# Patient Record
Sex: Female | Born: 1980
Health system: Southern US, Community
[De-identification: ages and names within clinical notes are randomized; demographics above are authoritative.]

## PROBLEM LIST (undated history)

## (undated) DIAGNOSIS — I10 Essential (primary) hypertension: Secondary | ICD-10-CM

## (undated) DIAGNOSIS — G43909 Migraine, unspecified, not intractable, without status migrainosus: Secondary | ICD-10-CM

## (undated) DIAGNOSIS — F32A Depression, unspecified: Secondary | ICD-10-CM

## (undated) DIAGNOSIS — G4700 Insomnia, unspecified: Secondary | ICD-10-CM

## (undated) DIAGNOSIS — G2581 Restless legs syndrome: Secondary | ICD-10-CM

## (undated) DIAGNOSIS — K219 Gastro-esophageal reflux disease without esophagitis: Secondary | ICD-10-CM

## (undated) DIAGNOSIS — K625 Hemorrhage of anus and rectum: Secondary | ICD-10-CM

## (undated) DIAGNOSIS — R7989 Other specified abnormal findings of blood chemistry: Secondary | ICD-10-CM

## (undated) DIAGNOSIS — E7800 Pure hypercholesterolemia, unspecified: Secondary | ICD-10-CM

## (undated) DIAGNOSIS — F411 Generalized anxiety disorder: Secondary | ICD-10-CM

## (undated) DIAGNOSIS — G252 Other specified forms of tremor: Secondary | ICD-10-CM

## (undated) DIAGNOSIS — O24419 Gestational diabetes mellitus in pregnancy, unspecified control: Secondary | ICD-10-CM

## (undated) DIAGNOSIS — K602 Anal fissure, unspecified: Secondary | ICD-10-CM

## (undated) DIAGNOSIS — G250 Essential tremor: Secondary | ICD-10-CM

## (undated) DIAGNOSIS — Z8639 Personal history of other endocrine, nutritional and metabolic disease: Secondary | ICD-10-CM

## (undated) DIAGNOSIS — Z8619 Personal history of other infectious and parasitic diseases: Secondary | ICD-10-CM

## (undated) DIAGNOSIS — F1011 Alcohol abuse, in remission: Secondary | ICD-10-CM

## (undated) DIAGNOSIS — F329 Major depressive disorder, single episode, unspecified: Secondary | ICD-10-CM

## (undated) DIAGNOSIS — R51 Headache: Secondary | ICD-10-CM

## (undated) DIAGNOSIS — D649 Anemia, unspecified: Secondary | ICD-10-CM

## (undated) DIAGNOSIS — Z87442 Personal history of urinary calculi: Secondary | ICD-10-CM

## (undated) HISTORY — DX: Other specified forms of tremor: G25.2

## (undated) HISTORY — DX: Migraine, unspecified, not intractable, without status migrainosus: G43.909

## (undated) HISTORY — DX: Essential (primary) hypertension: I10

## (undated) HISTORY — DX: Headache: R51

## (undated) HISTORY — DX: Insomnia, unspecified: G47.00

## (undated) HISTORY — DX: Gestational diabetes mellitus in pregnancy, unspecified control: O24.419

## (undated) HISTORY — DX: Hemorrhage of anus and rectum: K62.5

## (undated) HISTORY — DX: Restless legs syndrome: G25.81

## (undated) HISTORY — DX: Major depressive disorder, single episode, unspecified: F32.9

## (undated) HISTORY — PX: EXCISIONAL HEMORRHOIDECTOMY: SHX1541

## (undated) HISTORY — PX: WISDOM TOOTH EXTRACTION: SHX21

## (undated) HISTORY — DX: Personal history of urinary calculi: Z87.442

## (undated) HISTORY — DX: Anal fissure, unspecified: K60.2

## (undated) HISTORY — DX: Personal history of other endocrine, nutritional and metabolic disease: Z86.39

## (undated) HISTORY — DX: Anemia, unspecified: D64.9

## (undated) HISTORY — DX: Essential tremor: G25.0

## (undated) HISTORY — DX: Depression, unspecified: F32.A

## (undated) HISTORY — DX: Other specified abnormal findings of blood chemistry: R79.89

## (undated) HISTORY — DX: Generalized anxiety disorder: F41.1

## (undated) HISTORY — DX: Personal history of other infectious and parasitic diseases: Z86.19

## (undated) HISTORY — DX: Pure hypercholesterolemia, unspecified: E78.00

## (undated) HISTORY — DX: Alcohol abuse, in remission: F10.11

---
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
## 1999-01-23 ENCOUNTER — Encounter: Payer: Self-pay | Admitting: Internal Medicine

## 1999-01-23 ENCOUNTER — Ambulatory Visit (HOSPITAL_COMMUNITY): Admission: RE | Admit: 1999-01-23 | Discharge: 1999-01-23 | Payer: Self-pay | Admitting: Internal Medicine

## 1999-02-21 ENCOUNTER — Ambulatory Visit (HOSPITAL_COMMUNITY): Admission: RE | Admit: 1999-02-21 | Discharge: 1999-02-21 | Payer: Self-pay | Admitting: *Deleted

## 1999-02-21 ENCOUNTER — Encounter: Payer: Self-pay | Admitting: *Deleted

## 1999-12-02 ENCOUNTER — Other Ambulatory Visit: Admission: RE | Admit: 1999-12-02 | Discharge: 1999-12-02 | Payer: Self-pay | Admitting: Obstetrics and Gynecology

## 2000-02-11 ENCOUNTER — Ambulatory Visit (HOSPITAL_COMMUNITY): Admission: RE | Admit: 2000-02-11 | Discharge: 2000-02-11 | Payer: Self-pay | Admitting: Urology

## 2001-04-15 ENCOUNTER — Other Ambulatory Visit: Admission: RE | Admit: 2001-04-15 | Discharge: 2001-04-15 | Payer: Self-pay | Admitting: Obstetrics and Gynecology

## 2001-06-05 ENCOUNTER — Emergency Department (HOSPITAL_COMMUNITY): Admission: EM | Admit: 2001-06-05 | Discharge: 2001-06-05 | Payer: Self-pay | Admitting: Emergency Medicine

## 2001-06-05 ENCOUNTER — Encounter: Payer: Self-pay | Admitting: Emergency Medicine

## 2002-08-02 ENCOUNTER — Emergency Department (HOSPITAL_COMMUNITY): Admission: EM | Admit: 2002-08-02 | Discharge: 2002-08-02 | Payer: Self-pay | Admitting: Emergency Medicine

## 2002-08-03 ENCOUNTER — Encounter: Payer: Self-pay | Admitting: Emergency Medicine

## 2002-12-21 ENCOUNTER — Encounter: Payer: Self-pay | Admitting: Internal Medicine

## 2003-02-28 ENCOUNTER — Encounter: Payer: Self-pay | Admitting: Internal Medicine

## 2004-07-23 ENCOUNTER — Ambulatory Visit: Payer: Self-pay | Admitting: Internal Medicine

## 2006-01-15 ENCOUNTER — Ambulatory Visit: Payer: Self-pay | Admitting: Internal Medicine

## 2006-05-28 ENCOUNTER — Ambulatory Visit: Payer: Self-pay | Admitting: Internal Medicine

## 2006-05-28 LAB — CONVERTED CEMR LAB
ALT: 25 units/L (ref 0–40)
AST: 23 units/L (ref 0–37)
Albumin: 4 g/dL (ref 3.5–5.2)
Alkaline Phosphatase: 44 units/L (ref 39–117)
BUN: 13 mg/dL (ref 6–23)
Basophils Absolute: 0 10*3/uL (ref 0.0–0.1)
Basophils Relative: 0.4 % (ref 0.0–1.0)
CO2: 27 meq/L (ref 19–32)
Calcium: 9.2 mg/dL (ref 8.4–10.5)
Chloride: 102 meq/L (ref 96–112)
Chol/HDL Ratio, serum: 2.4
Cholesterol: 197 mg/dL (ref 0–200)
Creatinine, Ser: 0.8 mg/dL (ref 0.4–1.2)
Eosinophil percent: 1.3 % (ref 0.0–5.0)
GFR calc non Af Amer: 91 mL/min
Glomerular Filtration Rate, Af Am: 111 mL/min/{1.73_m2}
Glucose, Bld: 88 mg/dL (ref 70–99)
HCT: 43.8 % (ref 36.0–46.0)
HDL: 80.9 mg/dL (ref >39.0)
Hemoglobin: 14.9 g/dL (ref 12.0–15.0)
LDL Cholesterol: 104 mg/dL — ABNORMAL HIGH (ref 0–99)
Lymphocytes Relative: 27.1 % (ref 12.0–46.0)
MCHC: 34.1 g/dL (ref 30.0–36.0)
MCV: 94.9 fL (ref 78.0–100.0)
Monocytes Absolute: 1.1 10*3/uL — ABNORMAL HIGH (ref 0.2–0.7)
Monocytes Relative: 15 % — ABNORMAL HIGH (ref 3.0–11.0)
Neutro Abs: 4.2 10*3/uL (ref 1.4–7.7)
Neutrophils Relative %: 56.2 % (ref 43.0–77.0)
Platelets: 325 10*3/uL (ref 150–400)
Potassium: 3.7 meq/L (ref 3.5–5.1)
RBC: 4.62 M/uL (ref 3.87–5.11)
RDW: 13.1 % (ref 11.5–14.6)
Sodium: 137 meq/L (ref 135–145)
TSH: 1.21 microintl units/mL (ref 0.35–5.50)
Total Bilirubin: 1.2 mg/dL (ref 0.3–1.2)
Total Protein: 6.8 g/dL (ref 6.0–8.3)
Triglyceride fasting, serum: 60 mg/dL (ref 0–149)
VLDL: 12 mg/dL (ref 0–40)
WBC: 7.4 10*3/uL (ref 4.5–10.5)

## 2006-06-04 ENCOUNTER — Ambulatory Visit: Payer: Self-pay | Admitting: Internal Medicine

## 2006-09-10 ENCOUNTER — Emergency Department (HOSPITAL_COMMUNITY): Admission: EM | Admit: 2006-09-10 | Discharge: 2006-09-10 | Payer: Self-pay | Admitting: Emergency Medicine

## 2006-12-04 ENCOUNTER — Ambulatory Visit: Payer: Self-pay | Admitting: Internal Medicine

## 2007-03-25 ENCOUNTER — Ambulatory Visit: Payer: Self-pay | Admitting: Internal Medicine

## 2007-03-25 DIAGNOSIS — J40 Bronchitis, not specified as acute or chronic: Secondary | ICD-10-CM

## 2007-03-30 ENCOUNTER — Emergency Department (HOSPITAL_COMMUNITY): Admission: EM | Admit: 2007-03-30 | Discharge: 2007-03-30 | Payer: Self-pay | Admitting: Emergency Medicine

## 2007-08-23 ENCOUNTER — Ambulatory Visit: Payer: Self-pay | Admitting: Internal Medicine

## 2007-08-23 DIAGNOSIS — R519 Headache, unspecified: Secondary | ICD-10-CM | POA: Insufficient documentation

## 2007-08-23 DIAGNOSIS — J020 Streptococcal pharyngitis: Secondary | ICD-10-CM | POA: Insufficient documentation

## 2007-08-23 DIAGNOSIS — Z87442 Personal history of urinary calculi: Secondary | ICD-10-CM

## 2007-08-23 DIAGNOSIS — R51 Headache: Secondary | ICD-10-CM

## 2007-08-23 HISTORY — DX: Personal history of urinary calculi: Z87.442

## 2007-10-05 ENCOUNTER — Ambulatory Visit: Payer: Self-pay | Admitting: Internal Medicine

## 2007-10-05 DIAGNOSIS — G4700 Insomnia, unspecified: Secondary | ICD-10-CM

## 2007-10-05 HISTORY — DX: Insomnia, unspecified: G47.00

## 2007-10-29 ENCOUNTER — Ambulatory Visit: Payer: Self-pay | Admitting: Internal Medicine

## 2008-02-14 ENCOUNTER — Emergency Department (HOSPITAL_COMMUNITY): Admission: EM | Admit: 2008-02-14 | Discharge: 2008-02-15 | Payer: Self-pay | Admitting: Emergency Medicine

## 2008-03-09 ENCOUNTER — Ambulatory Visit: Payer: Self-pay | Admitting: Internal Medicine

## 2008-03-09 DIAGNOSIS — F411 Generalized anxiety disorder: Secondary | ICD-10-CM | POA: Insufficient documentation

## 2008-03-09 HISTORY — DX: Generalized anxiety disorder: F41.1

## 2008-06-13 ENCOUNTER — Ambulatory Visit: Payer: Self-pay | Admitting: Internal Medicine

## 2008-06-13 DIAGNOSIS — G252 Other specified forms of tremor: Secondary | ICD-10-CM

## 2008-06-13 DIAGNOSIS — G250 Essential tremor: Secondary | ICD-10-CM

## 2008-06-13 HISTORY — DX: Essential tremor: G25.0

## 2009-01-22 ENCOUNTER — Ambulatory Visit: Payer: Self-pay | Admitting: Internal Medicine

## 2009-01-22 DIAGNOSIS — G2581 Restless legs syndrome: Secondary | ICD-10-CM

## 2009-01-22 HISTORY — DX: Restless legs syndrome: G25.81

## 2009-04-04 ENCOUNTER — Telehealth: Payer: Self-pay | Admitting: Internal Medicine

## 2009-04-09 ENCOUNTER — Ambulatory Visit: Payer: Self-pay | Admitting: Internal Medicine

## 2009-04-09 DIAGNOSIS — K625 Hemorrhage of anus and rectum: Secondary | ICD-10-CM

## 2009-04-09 DIAGNOSIS — K602 Anal fissure, unspecified: Secondary | ICD-10-CM | POA: Insufficient documentation

## 2009-04-09 HISTORY — DX: Anal fissure, unspecified: K60.2

## 2009-04-09 HISTORY — DX: Hemorrhage of anus and rectum: K62.5

## 2009-04-09 LAB — CONVERTED CEMR LAB
Basophils Absolute: 0 10*3/uL (ref 0.0–0.1)
Basophils Relative: 0.1 % (ref 0.0–3.0)
Eosinophils Absolute: 0.1 10*3/uL (ref 0.0–0.7)
Eosinophils Relative: 1.1 % (ref 0.0–5.0)
HCT: 41.6 % (ref 36.0–46.0)
Hemoglobin: 14.3 g/dL (ref 12.0–15.0)
Lymphocytes Relative: 29.1 % (ref 12.0–46.0)
Lymphs Abs: 2.7 10*3/uL (ref 0.7–4.0)
MCHC: 34.3 g/dL (ref 30.0–36.0)
MCV: 94 fL (ref 78.0–100.0)
Monocytes Absolute: 0.8 10*3/uL (ref 0.1–1.0)
Monocytes Relative: 9.1 % (ref 3.0–12.0)
Neutro Abs: 5.6 10*3/uL (ref 1.4–7.7)
Neutrophils Relative %: 60.6 % (ref 43.0–77.0)
Platelets: 301 10*3/uL (ref 150.0–400.0)
RBC: 4.42 M/uL (ref 3.87–5.11)
RDW: 12.5 % (ref 11.5–14.6)
WBC: 9.2 10*3/uL (ref 4.5–10.5)

## 2009-04-18 ENCOUNTER — Telehealth: Payer: Self-pay | Admitting: Internal Medicine

## 2009-04-20 ENCOUNTER — Ambulatory Visit: Payer: Self-pay | Admitting: Internal Medicine

## 2009-04-24 ENCOUNTER — Ambulatory Visit: Payer: Self-pay | Admitting: Internal Medicine

## 2009-04-25 ENCOUNTER — Telehealth (INDEPENDENT_AMBULATORY_CARE_PROVIDER_SITE_OTHER): Payer: Self-pay

## 2009-05-02 ENCOUNTER — Encounter: Payer: Self-pay | Admitting: Internal Medicine

## 2009-05-07 ENCOUNTER — Ambulatory Visit (HOSPITAL_COMMUNITY): Admission: RE | Admit: 2009-05-07 | Discharge: 2009-05-07 | Payer: Self-pay | Admitting: Surgery

## 2009-05-23 ENCOUNTER — Encounter (INDEPENDENT_AMBULATORY_CARE_PROVIDER_SITE_OTHER): Payer: Self-pay | Admitting: *Deleted

## 2010-03-05 ENCOUNTER — Telehealth: Payer: Self-pay | Admitting: Internal Medicine

## 2010-03-07 ENCOUNTER — Telehealth: Payer: Self-pay | Admitting: Internal Medicine

## 2010-03-14 ENCOUNTER — Ambulatory Visit: Payer: Self-pay | Admitting: Internal Medicine

## 2010-03-19 ENCOUNTER — Telehealth: Payer: Self-pay | Admitting: Internal Medicine

## 2010-06-21 ENCOUNTER — Telehealth: Payer: Self-pay | Admitting: Internal Medicine

## 2010-07-30 NOTE — Assessment & Plan Note (Signed)
Summary: upper resp infection//ccm   Vital Signs:  Patient profile:   30 year old female Weight:      117 pounds O2 Sat:      98 % on Room air Temp:     99.3 degrees F oral BP sitting:   110 / 74  (left arm) Cuff size:   regular  Vitals Entered By: Duard Brady LPN (March 14, 2010 3:47 PM)  O2 Flow:  Room air CC: c/o congested cough , chest congestion , fatigue , muscle aches Is Patient Diabetic? No   Primary Care Provider:  Eleonore Chiquito, MD   CC:  c/o congested cough , chest congestion , fatigue , and muscle aches.  History of Present Illness: 30 year old patient history of ongoing tobacco use, who presents with a two week history of fatiguemyalgias, no the past week, worsening chest congestion, and productive cough, ridge is guarded, green sputum production.  She has had fever, but no chills.  Denies any chest pain or shortness of breath.  Allergies (verified): No Known Drug Allergies  Past History:  Past Medical History: Reviewed history from 04/09/2009 and no changes required. history scarlet fever, November 2009 Headache Nephrolithiasis, hx of tobacco abuse history of EtOH Anxiety intention tremor restless leg syndrome  Past Surgical History: Reviewed history from 08/23/2007 and no changes required. surgery for a kidney stone 2001  Family History: Reviewed history from 04/09/2009 and no changes required. father history of hypercholesterolemia mother, thyroid disease, and diabetes two brothers in good health Family History of Heart Disease: MGF  No FH of Colon Cancer:  Social History: Reviewed history from 04/09/2009 and no changes required. Single Current Smoker works as a Copywriter, advertising with [employer] Daily Caffeine Use: 3 daily   Review of Systems       The patient complains of prolonged cough.  The patient denies anorexia, fever, weight loss, weight gain, vision loss, decreased hearing, hoarseness, chest pain, syncope, dyspnea  on exertion, headaches, hemoptysis, abdominal pain, melena, hematochezia, severe indigestion/heartburn, hematuria, incontinence, genital sores, muscle weakness, suspicious skin lesions, transient blindness, difficulty walking, depression, unusual weight change, abnormal bleeding, enlarged lymph nodes, angioedema, and breast masses.    Physical Exam  General:  Well-developed,well-nourished,in no acute distress; alert,appropriate and cooperative throughout examination Head:  Normocephalic and atraumatic without obvious abnormalities. No apparent alopecia or balding. Eyes:  No corneal or conjunctival inflammation noted. EOMI. Perrla. Funduscopic exam benign, without hemorrhages, exudates or papilledema. Vision grossly normal. Ears:  External ear exam shows no significant lesions or deformities.  Otoscopic examination reveals clear canals, tympanic membranes are intact bilaterally without bulging, retraction, inflammation or discharge. Hearing is grossly normal bilaterally. Mouth:  Oral mucosa and oropharynx without lesions or exudates.  Teeth in good repair. Neck:  No deformities, masses, or tenderness noted. Lungs:  Normal respiratory effort, chest expands symmetrically. Lungs are clear to auscultation, no crackles or wheezes. O2 saturation 98% frequent paroxysms of coughing Heart:  Normal rate and regular rhythm. S1 and S2 normal without gallop, murmur, click, rub or other extra sounds.   Impression & Recommendations:  Problem # 1:  BRONCHITIS-ACUTE (ICD-466.0)  Her updated medication list for this problem includes:    Doxycycline Hyclate 100 Mg Caps (Doxycycline hyclate) ..... One twice daily    Hydrocodone-homatropine 5-1.5 Mg/24ml Syrp (Hydrocodone-homatropine) .Marland Kitchen... 1 teaspoon every 6 hours as needed for cough  Complete Medication List: 1)  Loestrin 1.5/30 (21) 1.5-30 Mg-mcg Tabs (Norethindrone acet-ethinyl est) .Marland Kitchen.. 1 once daily 2)  Trazodone Hcl 100 Mg Tabs (  Trazodone hcl) .Marland Kitchen.. 1 at  bedtime 3)  Pramipexole Dihydrochloride 0.25 Mg Tabs (Pramipexole dihydrochloride) .... 1/2 tablet at bedtine 4)  Miralax Powd (Polyethylene glycol 3350) .Marland Kitchen.. 1-2 doses a day to keep stools soft 5)  Doxycycline Hyclate 100 Mg Caps (Doxycycline hyclate) .... One twice daily 6)  Hydrocodone-homatropine 5-1.5 Mg/99ml Syrp (Hydrocodone-homatropine) .Marland Kitchen.. 1 teaspoon every 6 hours as needed for cough  Patient Instructions: 1)  Get plenty of rest, drink lots of clear liquids, and use Tylenol or Ibuprofen for fever and comfort. Return in 7-10 days if you're not better:sooner if you're feeling worse. Prescriptions: HYDROCODONE-HOMATROPINE 5-1.5 MG/5ML SYRP (HYDROCODONE-HOMATROPINE) 1 teaspoon every 6 hours as needed for cough  #6 oz x 0   Entered and Authorized by:   Gordy Savers  MD   Signed by:   Gordy Savers  MD on 03/14/2010   Method used:   Print then Give to Patient   RxID:   [id number] DOXYCYCLINE HYCLATE 100 MG CAPS (DOXYCYCLINE HYCLATE) one twice daily  #20 x 0   Entered and Authorized by:   Gordy Savers  MD   Signed by:   Gordy Savers  MD on 03/14/2010   Method used:   Print then Give to Patient   RxID:   [id number]

## 2010-07-30 NOTE — Progress Notes (Signed)
Summary: refill pramipexole - denied  Phone Note Refill Request Message from:  Fax from Pharmacy on March 05, 2010 10:58 AM  Refills Requested: Medication #1:  PRAMIPEXOLE DIHYDROCHLORIDE 0.25 MG TABS 1/2 tablet at bedtine   Last Refilled: 01/03/2010 [facility] [address]     Method Requested: Fax to Local Pharmacy Initial call taken by: Duard Brady LPN,  March 05, 2010 10:59 AM  Follow-up for Phone Call        deined - faxed to [facility] - must be seen - last seen 12/2008 Follow-up by: Duard Brady LPN,  March 05, 2010 11:00 AM

## 2010-07-30 NOTE — Progress Notes (Signed)
Summary: refill pramipexole  Phone Note Refill Request Message from:  Fax from Pharmacy on March 07, 2010 12:54 PM  Refills Requested: Medication #1:  PRAMIPEXOLE DIHYDROCHLORIDE 0.25 MG TABS 1/2 tablet at bedtine [facility] [address]    Method Requested: Fax to Local Pharmacy Initial call taken by: Duard Brady LPN,  March 07, 2010 12:55 PM    Prescriptions: PRAMIPEXOLE DIHYDROCHLORIDE 0.25 MG TABS (PRAMIPEXOLE DIHYDROCHLORIDE) 1/2 tablet at bedtine  #15 x 0   Entered by:   Duard Brady LPN   Authorized by:   Gordy Savers  MD   Signed by:   Duard Brady LPN on 16/03/9603   Method used:   Faxed to ...       [facility] [address] [phone]* (retail)       [address]       [place], [place]  [address]       Ph: [phone] or [phone]       Fax: [phone]   RxID:   [id number]

## 2010-07-30 NOTE — Progress Notes (Signed)
Summary: pt is requesting stronger abx and refill on cough med  Phone Note Call from Patient Call back at Home Phone [phone]   Caller: Patient Call For: Gordy Savers  MD Summary of Call: Pt was seen on 03-15-2010 for uri cough no better needs refill and stronger abx call [facility] [address] [phone]. Pt decline ov. Pt still has some abx left Initial call taken by: Heron Sabins,  March 19, 2010 12:24 PM  Follow-up for Phone Call        azithromycin 250  #6 2 initially, then one daily Follow-up by: Gordy Savers  MD,  March 19, 2010 12:39 PM    New/Updated Medications: ZITHROMAX 250 MG TABS (AZITHROMYCIN) 2 by mouth first day then 1 by mouth once daily for 4 days Prescriptions: ZITHROMAX 250 MG TABS (AZITHROMYCIN) 2 by mouth first day then 1 by mouth once daily for 4 days  #6 x 0   Entered by:   Duard Brady LPN   Authorized by:   Gordy Savers  MD   Signed by:   Duard Brady LPN on 47/82/9562   Method used:   Electronically to        [facility] [address] [phone]* (retail)       [address]       [place], [place]  [address]       Ph: [phone] or [phone]       Fax: [phone]   RxID:   [id number]  change to med list - efilled to [facility]   kik  Appended Document: pt is requesting stronger abx and refill on cough med attempt to call pt ans mach - LMTCB if questions - rx sent to [facility]

## 2010-08-01 NOTE — Progress Notes (Signed)
Summary: refill pramipexole  Phone Note Refill Request Message from:  Fax from Pharmacy on June 21, 2010 2:12 PM  Refills Requested: Medication #1:  PRAMIPEXOLE DIHYDROCHLORIDE 0.25 MG TABS 1/2 tablet at bedtine   Last Refilled: [facility]/07/2010 [facility] [address]   Method Requested: Fax to Local Pharmacy Initial call taken by: Duard Brady LPN,  June 21, 2010 2:13 PM    Prescriptions: PRAMIPEXOLE DIHYDROCHLORIDE 0.25 MG TABS (PRAMIPEXOLE DIHYDROCHLORIDE) 1/2 tablet at bedtine  #30 x 2   Entered by:   Duard Brady LPN   Authorized by:   Gordy Savers  MD   Signed by:   Duard Brady LPN on 16/03/9603   Method used:   Faxed to ...       [facility] [address] [phone]* (retail)       [address]       [place], [place]  [address]       Ph: [phone] or [phone]       Fax: [phone]   RxID:   [id number]

## 2010-09-03 ENCOUNTER — Other Ambulatory Visit: Payer: Self-pay | Admitting: Internal Medicine

## 2010-10-02 LAB — PREGNANCY, URINE: Preg Test, Ur: NEGATIVE

## 2010-11-15 NOTE — Assessment & Plan Note (Signed)
[facility] OFFICE NOTE   Alexis Craig, Alexis Craig                         MRN:          [id number]  DATE:06/04/2006                            DOB:          05-08-1981    A 31 year old white female seen today for a wellness exam.  She has had  a recent gynecologic check within the past few months.  Main complaints  include some headaches.  These have been responsive in the past to  Midrin.  In the remote past, had no effect with Imitrex, and OTC  medication such as Excedrin Migraine have only been marginally helpful.  She was seen in urgent care 4 weeks ago and is recovering from a URI.   PAST MEDICAL HISTORY:  Unremarkable.  Has seen Dr. Patsi Sears in the  past for a kidney stone.  Does have history of migraine headaches.   FAMILY HISTORY:  Both parents are in reasonably good health.  Father  with hypercholesterolemia, mother with thyroid disease and diabetes.  Both maternal grandparents had diabetes.  Two younger brothers remain  well.  A family history of cancer.   PHYSICAL EXAMINATION:  Exam revealed a healthy-appearing, fit, young  lady, no acute distress.  Blood pressure was low normal.  Weight 111,  afebrile.  __________ Ear, nose, and throat clear.  NECK:  No adenopathy or thyroid enlargement.  CHEST:  Clear.  BREASTS:  Not examined.  CARDIOVASCULAR:  Normal heart sounds, no murmurs.  ABDOMEN:  Benign.  EXTREMITIES:  Full peripheral pulses.  Reflexes were normal.  NEURO:  Negative.   IMPRESSION:  History of migraine headaches, unremarkable clinical exam.   DISPOSITION:  Cessation of smoking encouraged.  She was given a  prescription for Wellbutrin which had been helpful in the past.  She  also considered Chantix.  Return here p.r.n.     Gordy Savers, MD  Electronically Signed    PFK/MedQ  DD: 06/04/2006  DT: 06/04/2006  Job #: [id number]

## 2010-11-15 NOTE — Op Note (Signed)
[facility]  Patient:    Alexis Craig, Alexis Craig                         MRN: [id number] Proc. Date: 02/11/00 Adm. Date:  [id number] Attending:  Laqueta Jean                           Operative Report  PREOPERATIVE DIAGNOSIS:  Left renal stone.  POSTOPERATIVE DIAGNOSIS:  Left renal stone.  OPERATION PERFORMED:  Cystourethroscopy, left retrograde pyelogram, left ureteroscopy and left Double J catheter (no stone identified).  ANESTHESIA:  General.  BRIEF HISTORY:  After preoperative anesthesia, the patient is brought to the operating room and placed on the operating table in the dorsosupine position where general anesthesia was introduced.  She was then replaced in the dorsolithotomy position where the pubis was prepped with Betadine solution and draped in the usual fashion.  This 30 year old female has a four week history of left-sided abdominal pain, microhematuria, nausea, vomiting with 15-20 white blood cells in her urine and 8-10 red blood cells.  Urine culture showed no growth.  The patient was given Toradol injection with improvement and a non-contrast CT scan was accomplished.  The CT scan was showed a 5 mm calculus obstructing the left distal ureter, very close to the ureterovesical junction.  There was a 1.5 cm incidental right ovarian cyst, and two 1 mm calcifications in each kidney. Otherwise the patient had a normal gallbladder, normal liver, normal pancreas and normal spleen.  The patient has not passed her stone and continues to have left flank and left lower quadrant pain, hematuria and is now for cystoscopy and evaluation of possible basket extraction.  DESCRIPTION OF PROCEDURE:  After the induction of general anesthesia, the patient is placed in the dorsolithotomy position where the pubis was prepped with Betadine solution.  Cystourethroscopy was accomplished and showed a appearing bladder, except for some erythematous irritations at  the base of the bladder, consistent with recent urinary tract infection.  A left retrograde pyelogram is performed and it is noted that the patient did have a 5 mm calcification in the left pelvis, but it is just outside the left lower ureter.  Photo documentation accomplished of this.  A wire was placed into the renal pelvis and ureteroscopy was accomplished into the renal pelvis.  This was also photo documented.  The ureter appeared completely normal.  I do not see any evidence of a ureteral calculus.  If there were a stone, it would have flushed back into the kidney.  Because of the patients continued discomfort, I have elected to place a Double J catheter, 6 mm x 24 cm.  This was passed and coiled in the renal pelvis and in the bladder.  The patient was awakened after given IV Toradol and taken to the recovery room in excellent condition. DD:  02/11/00 TD:  02/11/00 Job: [id number]

## 2010-11-22 ENCOUNTER — Other Ambulatory Visit: Payer: Self-pay | Admitting: Internal Medicine

## 2011-02-14 ENCOUNTER — Other Ambulatory Visit: Payer: Self-pay | Admitting: Internal Medicine

## 2011-05-11 ENCOUNTER — Other Ambulatory Visit: Payer: Self-pay | Admitting: Internal Medicine

## 2011-05-16 ENCOUNTER — Other Ambulatory Visit: Payer: Self-pay | Admitting: Internal Medicine

## 2011-05-26 ENCOUNTER — Telehealth: Payer: Self-pay | Admitting: Internal Medicine

## 2011-05-26 MED ORDER — OSELTAMIVIR PHOSPHATE 30 MG PO CAPS: 30.0000 mg | ORAL_CAPSULE | Freq: Two times a day (BID) | ORAL | Status: DC

## 2011-05-26 NOTE — Telephone Encounter (Signed)
Please advise 

## 2011-05-26 NOTE — Telephone Encounter (Signed)
Patient is return a call.  Rx sent to pharmacy and patient is aware.

## 2011-05-26 NOTE — Telephone Encounter (Signed)
Pt called and said that she has the flu. Pt is req to get a script for Tamaflu. [facility] [address]. Body Aches, fever, cough.

## 2011-05-26 NOTE — Telephone Encounter (Signed)
OK   #10   One BID

## 2011-06-12 ENCOUNTER — Ambulatory Visit (INDEPENDENT_AMBULATORY_CARE_PROVIDER_SITE_OTHER): Payer: BC Managed Care – PPO | Admitting: Internal Medicine

## 2011-06-12 ENCOUNTER — Encounter: Payer: Self-pay | Admitting: Internal Medicine

## 2011-06-12 VITALS — BP 118/78 | Temp 99.2°F | Wt 117.0 lb

## 2011-06-12 DIAGNOSIS — J40 Bronchitis, not specified as acute or chronic: Secondary | ICD-10-CM

## 2011-06-12 MED ORDER — BUPROPION HCL ER (SR) 150 MG PO TB12: 150.0000 mg | ORAL_TABLET | Freq: Two times a day (BID) | ORAL | Status: DC

## 2011-06-12 MED ORDER — TRAZODONE HCL 100 MG PO TABS: 100.0000 mg | ORAL_TABLET | Freq: Every day | ORAL | Status: DC

## 2011-06-12 MED ORDER — NORETHINDRONE ACET-ETHINYL EST 1.5-30 MG-MCG PO TABS: 1.0000 | ORAL_TABLET | Freq: Every day | ORAL | Status: DC

## 2011-06-12 MED ORDER — HYDROCODONE-HOMATROPINE 5-1.5 MG/5ML PO SYRP: 5.0000 mL | ORAL_SOLUTION | Freq: Four times a day (QID) | ORAL | Status: DC | PRN

## 2011-06-12 MED ORDER — PRAMIPEXOLE DIHYDROCHLORIDE 0.25 MG PO TABS: ORAL_TABLET | ORAL | Status: DC

## 2011-06-12 NOTE — Progress Notes (Signed)
  Subjective:    Patient ID: Alexis Craig, female    DOB: 05/17/1981, 30 y.o.   MRN: [id number]  HPI   30 year old patient  who was treated for a flu syndrome approximately 2 weeks ago. The past several days she has had increasing chest congestion cough and low-grade fever. She continues to smoke and is requesting assistance with a smoking cessation program. She has been treated with Wellbutrin in the past with success. She is requesting a refill on this medication. 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  HENT: Positive for congestion. Negative for hearing loss, sore throat, rhinorrhea, dental problem, sinus pressure and tinnitus.   Eyes: Negative for pain, discharge and visual disturbance.  Respiratory: Positive for cough. Negative for shortness of breath.   Cardiovascular: Negative for chest pain, palpitations and leg swelling.  Gastrointestinal: Negative for nausea, vomiting, abdominal pain, diarrhea, constipation, blood in stool and abdominal distention.  Genitourinary: Negative for dysuria, urgency, frequency, hematuria, flank pain, vaginal bleeding, vaginal discharge, difficulty urinating, vaginal pain and pelvic pain.  Musculoskeletal: Negative for joint swelling, arthralgias and gait problem.  Skin: Negative for rash.  Neurological: Negative for dizziness, syncope, speech difficulty, weakness, numbness and headaches.  Hematological: Negative for adenopathy.  Psychiatric/Behavioral: Negative for behavioral problems, dysphoric mood and agitation. The patient is not nervous/anxious.        Objective:   Physical Exam  Constitutional: She is oriented to person, place, and time. She appears well-developed and well-nourished.  HENT:  Head: Normocephalic.  Right Ear: External ear normal.  Left Ear: External ear normal.  Mouth/Throat: Oropharynx is clear and moist.  Eyes: Conjunctivae and EOM are  normal. Pupils are equal, round, and reactive to light.  Neck: Normal range of motion. Neck supple. No thyromegaly present.  Cardiovascular: Normal rate, regular rhythm, normal heart sounds and intact distal pulses.   Pulmonary/Chest: Effort normal and breath sounds normal.  Abdominal: Soft. Bowel sounds are normal. She exhibits no mass. There is no tenderness.  Musculoskeletal: Normal range of motion.  Lymphadenopathy:    She has no cervical adenopathy.  Neurological: She is alert and oriented to person, place, and time.  Skin: Skin is warm and dry. No rash noted.  Psychiatric: She has a normal mood and affect. Her behavior is normal.          Assessment & Plan:  Viral URI. Will treat symptomatically Ongoing tobacco use. Will give a trial of Wellbutrin. Patient seems motivated to discontinue tobacco  Medications refilled

## 2011-06-12 NOTE — Patient Instructions (Signed)
Get plenty of rest, Drink lots of  clear liquids, and use Tylenol or ibuprofen for fever and discomfort.    Smoking tobacco is very bad for your health. You should stop smoking immediately. 

## 2011-08-06 ENCOUNTER — Other Ambulatory Visit: Payer: Self-pay | Admitting: Internal Medicine

## 2011-09-20 ENCOUNTER — Other Ambulatory Visit: Payer: Self-pay | Admitting: Internal Medicine

## 2011-11-02 ENCOUNTER — Other Ambulatory Visit: Payer: Self-pay | Admitting: Internal Medicine

## 2011-11-23 ENCOUNTER — Other Ambulatory Visit: Payer: Self-pay | Admitting: Internal Medicine

## 2011-12-09 ENCOUNTER — Other Ambulatory Visit: Payer: Self-pay | Admitting: Internal Medicine

## 2012-04-15 ENCOUNTER — Other Ambulatory Visit: Payer: Self-pay

## 2012-04-15 MED ORDER — TRAZODONE HCL 100 MG PO TABS: 100.0000 mg | ORAL_TABLET | Freq: Every day | ORAL | Status: DC

## 2012-06-05 ENCOUNTER — Other Ambulatory Visit: Payer: Self-pay | Admitting: Internal Medicine

## 2012-06-18 ENCOUNTER — Encounter: Payer: Self-pay | Admitting: Internal Medicine

## 2012-06-18 ENCOUNTER — Ambulatory Visit (INDEPENDENT_AMBULATORY_CARE_PROVIDER_SITE_OTHER): Payer: BC Managed Care – PPO | Admitting: Internal Medicine

## 2012-06-18 VITALS — BP 120/80 | HR 92 | Temp 98.8°F | Resp 18 | Wt 150.0 lb

## 2012-06-18 DIAGNOSIS — G2581 Restless legs syndrome: Secondary | ICD-10-CM

## 2012-06-18 DIAGNOSIS — F411 Generalized anxiety disorder: Secondary | ICD-10-CM

## 2012-06-18 DIAGNOSIS — G4700 Insomnia, unspecified: Secondary | ICD-10-CM

## 2012-06-18 MED ORDER — LORAZEPAM 0.5 MG PO TABS: 0.5000 mg | ORAL_TABLET | Freq: Two times a day (BID) | ORAL | Status: DC | PRN

## 2012-06-18 MED ORDER — PROPRANOLOL HCL 40 MG PO TABS: 40.0000 mg | ORAL_TABLET | Freq: Three times a day (TID) | ORAL | Status: DC

## 2012-06-18 MED ORDER — PRAMIPEXOLE DIHYDROCHLORIDE 0.25 MG PO TABS: 0.2500 mg | ORAL_TABLET | Freq: Every day | ORAL | Status: DC

## 2012-06-18 MED ORDER — TRAZODONE 25 MG HALF TABLET: 50.0000 mg | ORAL_TABLET | Freq: Every day | ORAL | Status: DC

## 2012-06-18 NOTE — Progress Notes (Signed)
Subjective:    Patient ID: Alexis Craig, female    DOB: [birth year], 31 y.o.   MRN: [id number]  HPI  31 year old patient who is seen today with a number of concerns. She discontinued tobacco use about one year ago and has had some significant weight gain. She has a significant reflux symptoms. She does consume caffeine at a fairly high dose. Symptoms are worse after meals and Lying flat at bedtime She has a history of restless leg syndrome that has done well on Mirapex. She has chronic insomnia and wishes to discontinue trazodone since this has been associated with excess hunger and weight gain. She gave herself a trial off the medication abruptly and did not do well as far as sleep habits. She also complains of  fear of flying;  She has a new job position but requires frequent flights which has become quite problematic.  Past Medical History  Diagnosis Date  . Anal fissure 04/09/2009  . ANXIETY 03/09/2008  . Headache 08/23/2007  . INSOMNIA 10/05/2007  . INTENTION TREMOR 06/13/2008  . NEPHROLITHIASIS, HX OF 08/23/2007  . RECTAL BLEEDING 04/09/2009  . RESTLESS LEG SYNDROME 01/22/2009    History   Social History  . Marital Status: Single    Spouse Name: N/A    Number of Children: N/A  . Years of Education: N/A   Occupational History  . Not on file.   Social History Main Topics  . Smoking status: Former Smoker    Types: Cigarettes  . Smokeless tobacco: Former Neurosurgeon    Quit date: 06/24/2011  . Alcohol Use: Not on file  . Drug Use: No  . Sexually Active: Not on file   Other Topics Concern  . Not on file   Social History Narrative  . No narrative on file    No past surgical history on file.  Family History  Problem Relation Age of Onset  . Diabetes Mother   . Thyroid disease Mother   . Hyperlipidemia Father     Allergies  Allergen Reactions  . Nsaids Rash    Blisters also    Current Outpatient Prescriptions on File Prior to Visit  Medication Sig Dispense Refill  .  buPROPion (WELLBUTRIN SR) 150 MG 12 hr tablet TAKE 1 TABLET BY MOUTH TWICE DAILY  60 tablet  5  . polyethylene glycol powder (GLYCOLAX/MIRALAX) powder Take 17 g by mouth daily.        . pramipexole (MIRAPEX) 0.25 MG tablet TAKE 1 TABLET BY MOUTH EVERY NIGHT AT BEDTIME  90 tablet  0  . traZODone (DESYREL) 100 MG tablet Take 1 tablet (100 mg total) by mouth at bedtime.  90 tablet  0  . MICROGESTIN 1.5-30 MG-MCG tablet TAKE 1 TABLET BY MOUTH EVERY DAY  21 tablet  3    BP 120/80  Pulse 92  Temp 98.8 F (37.1 C) (Oral)  Resp 18  Wt 150 lb (68.04 kg)  SpO2 98%  LMP 05/31/2012     Review of Systems  Constitutional: Negative.   HENT: Negative for hearing loss, congestion, sore throat, rhinorrhea, dental problem, sinus pressure and tinnitus.   Eyes: Negative for pain, discharge and visual disturbance.  Respiratory: Negative for cough and shortness of breath.   Cardiovascular: Negative for chest pain, palpitations and leg swelling.  Gastrointestinal: Negative for nausea, vomiting, abdominal pain, diarrhea, constipation, blood in stool and abdominal distention.  Genitourinary: Negative for dysuria, urgency, frequency, hematuria, flank pain, vaginal bleeding, vaginal discharge, difficulty urinating, vaginal pain and pelvic pain.  Musculoskeletal: Negative for joint swelling, arthralgias and gait problem.  Skin: Negative for rash.  Neurological: Negative for dizziness, syncope, speech difficulty, weakness, numbness and headaches.  Hematological: Negative for adenopathy.  Psychiatric/Behavioral: Positive for sleep disturbance. Negative for behavioral problems, dysphoric mood and agitation. The patient is nervous/anxious.        Objective:   Physical Exam        Assessment & Plan:   Significant GERD. Anti-reflex regimen discussed. Will be placed on PPI therapy Fear of flying phobia. Will trial lorazepam and beta blocker therapy prior to her flight may refer for a cognitive behavior  therapy Chronic insomnia we'll taper and discontinue trazodone. Try lorazepam when necessary

## 2012-06-18 NOTE — Patient Instructions (Addendum)
Avoids foods high in acid such as tomatoes citrus juices, and spicy foods.  Avoid eating within two hours of lying down or before exercising.  Do not overheat.  Try smaller more frequent meals.  If symptoms persist, elevate the head of her bed 12 inches while sleeping.  Insomnia Insomnia is frequent trouble falling and/or staying asleep. Insomnia can be a long term problem or a short term problem. Both are common. Insomnia can be a short term problem when the wakefulness is related to a certain stress or worry. Long term insomnia is often related to ongoing stress during waking hours and/or poor sleeping habits. Overtime, sleep deprivation itself can make the problem worse. Every little thing feels more severe because you are overtired and your ability to cope is decreased. CAUSES    Stress, anxiety, and depression.   Poor sleeping habits.   Distractions such as TV in the bedroom.   Naps close to bedtime.   Engaging in emotionally charged conversations before bed.   Technical reading before sleep.   Alcohol and other sedatives. They may make the problem worse. They can hurt normal sleep patterns and normal dream activity.   Stimulants such as caffeine for several hours prior to bedtime.   Pain syndromes and shortness of breath can cause insomnia.   Exercise late at night.   Changing time zones may cause sleeping problems (jet lag).  It is sometimes helpful to have someone observe your sleeping patterns. They should look for periods of not breathing during the night (sleep apnea). They should also look to see how long those periods last. If you live alone or observers are uncertain, you can also be observed at a sleep clinic where your sleep patterns will be professionally monitored. Sleep apnea requires a checkup and treatment. Give your caregivers your medical history. Give your caregivers observations your family has made about your sleep.   SYMPTOMS    Not feeling rested in the  morning.   Anxiety and restlessness at bedtime.   Difficulty falling and staying asleep.  TREATMENT    Your caregiver may prescribe treatment for an underlying medical disorders. Your caregiver can give advice or help if you are using alcohol or other drugs for self-medication. Treatment of underlying problems will usually eliminate insomnia problems.   Medications can be prescribed for short time use. They are generally not recommended for lengthy use.   Over-the-counter sleep medicines are not recommended for lengthy use. They can be habit forming.   You can promote easier sleeping by making lifestyle changes such as:   Using relaxation techniques that help with breathing and reduce muscle tension.   Exercising earlier in the day.   Changing your diet and the time of your last meal. No night time snacks.   Establish a regular time to go to bed.   Counseling can help with stressful problems and worry.   Soothing music and white noise may be helpful if there are background noises you cannot remove.   Stop tedious detailed work at least one hour before bedtime.  HOME CARE INSTRUCTIONS    Keep a diary. Inform your caregiver about your progress. This includes any medication side effects. See your caregiver regularly. Take note of:   Times when you are asleep.   Times when you are awake during the night.   The quality of your sleep.   How you feel the next day.  This information will help your caregiver care for you.  Get out of  bed if you are still awake after 15 minutes. Read or do some quiet activity. Keep the lights down. Wait until you feel sleepy and go back to bed.   Keep regular sleeping and waking hours. Avoid naps.   Exercise regularly.   Avoid distractions at bedtime. Distractions include watching television or engaging in any intense or detailed activity like attempting to balance the household checkbook.   Develop a bedtime ritual. Keep a familiar routine of  bathing, brushing your teeth, climbing into bed at the same time each night, listening to soothing music. Routines increase the success of falling to sleep faster.   Use relaxation techniques. This can be using breathing and muscle tension release routines. It can also include visualizing peaceful scenes. You can also help control troubling or intruding thoughts by keeping your mind occupied with boring or repetitive thoughts like the old concept of counting sheep. You can make it more creative like imagining planting one beautiful flower after another in your backyard garden.   During your day, work to eliminate stress. When this is not possible use some of the previous suggestions to help reduce the anxiety that accompanies stressful situations.  MAKE SURE YOU:    Understand these instructions.   Will watch your condition.   Will get help right away if you are not doing well or get worse.  Document Released: 06/13/2000 Document Revised: 09/08/2011 Document Reviewed: 07/14/2007 [facility] Patient Information 2013 [place], [place].   Diphenhydramine capsules or tablets [Insomnia] What is this medicine? DIPHENHYDRAMINE (dye fen HYE dra meen) is an antihistamine. This medicine is used to treat occasional sleeplesness. This medicine may be used for other purposes; ask your health care provider or pharmacist if you have questions. What should I tell my health care provider before I take this medicine? They need to know if you have any of these conditions: -glaucoma -high blood pressure or heart disease -liver disease -lung or breathing disease, like asthma -pain or trouble passing urine -prostate trouble -ulcers or other stomach problems -an unusual or allergic reaction to diphenhydramine, other medicines foods, dyes, or preservatives such as sulfites -pregnant or trying to get pregnant -breast-feeding How should I use this medicine? Take this medicine by mouth with a full glass of water.  Follow the directions on the prescription label. Do not take your medicine more often than directed. Talk to your pediatrician regarding the use of this medicine in children. While this drug may be prescribed for children as young as 89 years old for selected conditions, precautions do apply. Patients over 43 years old may have a stronger reaction and need a smaller dose. Overdosage: If you think you have taken too much of this medicine contact a poison control center or emergency room at once. NOTE: This medicine is only for you. Do not share this medicine with others. What if I miss a dose? If you miss a dose, take it as soon as you can. If it is almost time for your next dose, take only that dose. Do not take double or extra doses. What may interact with this medicine? Do not take this medicine with any of the following medications: -MAOIs like Carbex, Eldepryl, Marplan, Nardil, and Parnate This medicine may also interact with the following medications: -alcohol -barbiturates like phenobarbital -medicines for bladder spasm like oxybutynin, tolterodine -medicines for blood pressure -medicines for depression, anxiety, or psychotic disturbances -medicines for movement abnormalities or Parkinson's disease -medicines for sleep -other medicines for cold, cough, or allergy -some medicines  for the stomach like chlordiazepoxide, dicyclomine This list may not describe all possible interactions. Give your health care provider a list of all the medicines, herbs, non-prescription drugs, or dietary supplements you use. Also tell them if you smoke, drink alcohol, or use illegal drugs. Some items may interact with your medicine. What should I watch for while using this medicine? Visit your doctor or health care professional for regular check ups. Tell your doctor or health care professional if your symptoms do not start to get better or if they get worse. Your mouth may get dry. Chewing sugarless gum or  sucking hard candy, and drinking plenty of water may help. Contact your doctor if the problem does not go away or is severe. This medicine may cause dry eyes and blurred vision. If you wear contact lenses you may feel some discomfort. Lubricating drops may help. See your eye doctor if the problem does not go away or is severe. You may get drowsy or dizzy. Do not drive, use machinery, or do anything that needs mental alertness until you know how this medicine affects you. Do not stand or sit up quickly, especially if you are an older patient. This reduces the risk of dizzy or fainting spells. Alcohol may interfere with the effect of this medicine. Avoid alcoholic drinks. What side effects may I notice from receiving this medicine? Side effects that you should report to your doctor or health care professional as soon as possible: -allergic reactions like skin rash, itching or hives, swelling of the face, lips, or tongue -changes in vision -confused, agitated, or nervous -fast, irregular heartbeat -tremor -trouble passing urine or change in the amount of urine -unusual bleeding or bruising -unusually weak or tired Side effects that usually do not require medical attention (report to your doctor or health care professional if they continue or are bothersome): -constipation, diarrhea -drowsy -headache -loss of appetite -stomach upset, vomiting -thick mucus This list may not describe all possible side effects. Call your doctor for medical advice about side effects. You may report side effects to FDA at 1-800-FDA-1088. Where should I keep my medicine? Keep out of the reach of children. Store at room temperature between 20 and 25 degrees C (68 and 77 degrees F). Keep container closed tightly. Throw away any unused medicine after the expiration date. NOTE: This sheet is a summary. It may not cover all possible information. If you have questions about this medicine, talk to your doctor, pharmacist, or  health care provider.  2013, [employer]. (10/15/2007 4:14:00 PM)   Diet for Gastroesophageal Reflux Disease, Adult Reflux (acid reflux) is when acid from your stomach flows up into the esophagus. When acid comes in contact with the esophagus, the acid causes irritation and soreness (inflammation) in the esophagus. When reflux happens often or so severely that it causes damage to the esophagus, it is called gastroesophageal reflux disease (GERD). Nutrition therapy can help ease the discomfort of GERD. FOODS OR DRINKS TO AVOID OR LIMIT  Smoking or chewing tobacco. Nicotine is one of the most potent stimulants to acid production in the gastrointestinal tract.   Caffeinated and decaffeinated coffee and black tea.   Regular or low-calorie carbonated beverages or energy drinks (caffeine-free carbonated beverages are allowed).     Strong spices, such as black pepper, white pepper, red pepper, cayenne, curry powder, and chili powder.   Peppermint or spearmint.   Chocolate.   High-fat foods, including meats and fried foods. Extra added fats including oils, butter, salad  dressings, and nuts. Limit these to less than 8 tsp per day.   Fruits and vegetables if they are not tolerated, such as citrus fruits or tomatoes.   Alcohol.   Any food that seems to aggravate your condition.  If you have questions regarding your diet, call your caregiver or a registered dietitian. OTHER THINGS THAT MAY HELP GERD INCLUDE:    Eating your meals slowly, in a relaxed setting.   Eating 5 to 6 small meals per day instead of 3 large meals.   Eliminating food for a period of time if it causes distress.   Not lying down until 3 hours after eating a meal.   Keeping the head of your bed raised 6 to 9 inches (15 to 23 cm) by using a foam wedge or blocks under the legs of the bed. Lying flat may make symptoms worse.   Being physically active. Weight loss may be helpful in reducing reflux in overweight or  obese adults.   Wear loose fitting clothing  EXAMPLE MEAL PLAN This meal plan is approximately 2,000 calories based on https://www.[facility].org/ meal planning guidelines. Breakfast   cup cooked oatmeal.   1 cup strawberries.   1 cup low-fat milk.   1 oz almonds.  Snack  1 cup cucumber slices.   6 oz yogurt (made from low-fat or fat-free milk).  Lunch  2 slice whole-wheat bread.   2 oz sliced [place].   2 tsp mayonnaise.   1 cup blueberries.   1 cup snap peas.  Snack  6 whole-wheat crackers.   1 oz string cheese.  Dinner   cup brown rice.   1 cup mixed veggies.   1 tsp olive oil.   3 oz grilled fish.  Document Released: 06/16/2005 Document Revised: 09/08/2011 Document Reviewed: 05/02/2011 [facility] Patient Information 2013 [place], [place].    Taper trazodone- 75 mg at bedtime for 2 weeks then 50 mg at bedtime for 2 weeks and 25 mg at bedtime for 2 weeks then consider discontinuation

## 2012-08-17 ENCOUNTER — Other Ambulatory Visit: Payer: Self-pay | Admitting: *Deleted

## 2012-08-17 MED ORDER — TRAZODONE 25 MG HALF TABLET: 50.0000 mg | ORAL_TABLET | Freq: Every day | ORAL | Status: DC

## 2012-09-22 ENCOUNTER — Encounter: Payer: Self-pay | Admitting: Internal Medicine

## 2012-09-22 ENCOUNTER — Ambulatory Visit (INDEPENDENT_AMBULATORY_CARE_PROVIDER_SITE_OTHER): Payer: BC Managed Care – PPO | Admitting: Internal Medicine

## 2012-09-22 VITALS — BP 120/80 | HR 87 | Temp 98.7°F | Resp 18 | Wt 150.0 lb

## 2012-09-22 DIAGNOSIS — J069 Acute upper respiratory infection, unspecified: Secondary | ICD-10-CM

## 2012-09-22 MED ORDER — HYDROCODONE-ACETAMINOPHEN 5-300 MG PO TABS: 1.0000 | ORAL_TABLET | Freq: Four times a day (QID) | ORAL | Status: DC | PRN

## 2012-09-22 NOTE — Progress Notes (Signed)
Subjective:    Patient ID: Alexis Craig, female    DOB: 12/11/1980, 32 y.o.   MRN: [id number]  HPI  32 year old patient who presents with a four-day history of sore throat cough congestion and malaise. She has missed 2 days of work due to acute illness. No fever. Cough is largely nonproductive and fairly minor chief complaints are weakness and sore throat. She has been using OTC medications with some benefit.  Past Medical History  Diagnosis Date  . Anal fissure 04/09/2009  . ANXIETY 03/09/2008  . Headache 08/23/2007  . INSOMNIA 10/05/2007  . INTENTION TREMOR 06/13/2008  . NEPHROLITHIASIS, HX OF 08/23/2007  . RECTAL BLEEDING 04/09/2009  . RESTLESS LEG SYNDROME 01/22/2009    History   Social History  . Marital Status: Single    Spouse Name: N/A    Number of Children: N/A  . Years of Education: N/A   Occupational History  . Not on file.   Social History Main Topics  . Smoking status: Former Smoker    Types: Cigarettes  . Smokeless tobacco: Former Neurosurgeon    Quit date: 06/24/2011  . Alcohol Use: Not on file  . Drug Use: No  . Sexually Active: Not on file   Other Topics Concern  . Not on file   Social History Narrative  . No narrative on file    History reviewed. No pertinent past surgical history.  Family History  Problem Relation Age of Onset  . Diabetes Mother   . Thyroid disease Mother   . Hyperlipidemia Father     Allergies  Allergen Reactions  . Nsaids Rash    Blisters also    Current Outpatient Prescriptions on File Prior to Visit  Medication Sig Dispense Refill  . Levonorgestrel-Ethinyl Estradiol (SEASONIQUE) 0.15-0.03 &0.01 MG tablet Take 1 tablet by mouth daily.      Marland Kitchen LORazepam (ATIVAN) 0.5 MG tablet Take 1 tablet (0.5 mg total) by mouth 2 (two) times daily as needed for anxiety.  30 tablet  1  . polyethylene glycol powder (GLYCOLAX/MIRALAX) powder Take 17 g by mouth daily.        . pramipexole (MIRAPEX) 0.25 MG tablet Take 1 tablet (0.25 mg total) by  mouth at bedtime.  90 tablet  4  . propranolol (INDERAL) 40 MG tablet Take 1 tablet (40 mg total) by mouth 3 (three) times daily.  30 tablet  4  . triamcinolone ointment (KENALOG) 0.1 % Apply 1 application topically as needed.        No current facility-administered medications on file prior to visit.    BP 120/80  Pulse 87  Temp(Src) 98.7 F (37.1 C) (Oral)  Resp 18  Wt 150 lb (68.04 kg)  BMI 26.58 kg/m2  SpO2 98%       Review of Systems  Constitutional: Positive for activity change, appetite change and fatigue.  HENT: Positive for sore throat, postnasal drip and sinus pressure. Negative for hearing loss, congestion, rhinorrhea, dental problem and tinnitus.   Eyes: Negative for pain, discharge and visual disturbance.  Respiratory: Positive for cough. Negative for shortness of breath.   Cardiovascular: Negative for chest pain, palpitations and leg swelling.  Gastrointestinal: Negative for nausea, vomiting, abdominal pain, diarrhea, constipation, blood in stool and abdominal distention.  Genitourinary: Negative for dysuria, urgency, frequency, hematuria, flank pain, vaginal bleeding, vaginal discharge, difficulty urinating, vaginal pain and pelvic pain.  Musculoskeletal: Negative for joint swelling, arthralgias and gait problem.  Skin: Negative for rash.  Neurological: Negative for dizziness, syncope, speech  difficulty, weakness, numbness and headaches.  Hematological: Negative for adenopathy.  Psychiatric/Behavioral: Negative for behavioral problems, dysphoric mood and agitation. The patient is not nervous/anxious.        Objective:   Physical Exam  Constitutional: She is oriented to person, place, and time. She appears well-developed and well-nourished.  HENT:  Head: Normocephalic.  Right Ear: External ear normal.  Left Ear: External ear normal.  Mild erythema  Eyes: Conjunctivae and EOM are normal. Pupils are equal, round, and reactive to light.  Neck: Normal range of  motion. Neck supple. No thyromegaly present.  Cardiovascular: Normal rate, regular rhythm, normal heart sounds and intact distal pulses.   Pulmonary/Chest: Effort normal and breath sounds normal. No respiratory distress. She has no wheezes. She has no rales.  Abdominal: Soft. Bowel sounds are normal. She exhibits no mass. There is no tenderness.  Musculoskeletal: Normal range of motion.  Lymphadenopathy:    She has no cervical adenopathy.  Neurological: She is alert and oriented to person, place, and time.  Skin: Skin is warm and dry. No rash noted.  Psychiatric: She has a normal mood and affect. Her behavior is normal.          Assessment & Plan:   Viral URI with cough. We'll treat symptomatically

## 2012-09-22 NOTE — Patient Instructions (Signed)
Acute bronchitis symptoms for less than 10 days are generally not helped by antibiotics.  Take over-the-counter expectorants and cough medications such as  Mucinex DM.  Call if there is no improvement in 5 to 7 days or if he developed worsening cough, fever, or new symptoms, such as shortness of breath or chest pain.    

## 2012-10-07 ENCOUNTER — Other Ambulatory Visit: Payer: Self-pay | Admitting: Internal Medicine

## 2012-12-01 ENCOUNTER — Other Ambulatory Visit: Payer: Self-pay | Admitting: Internal Medicine

## 2012-12-30 ENCOUNTER — Ambulatory Visit (INDEPENDENT_AMBULATORY_CARE_PROVIDER_SITE_OTHER): Payer: BC Managed Care – PPO | Admitting: Internal Medicine

## 2012-12-30 ENCOUNTER — Encounter: Payer: Self-pay | Admitting: Internal Medicine

## 2012-12-30 VITALS — BP 120/80 | HR 94 | Temp 98.5°F | Resp 20 | Wt 152.8 lb

## 2012-12-30 DIAGNOSIS — M25552 Pain in left hip: Secondary | ICD-10-CM

## 2012-12-30 DIAGNOSIS — M25559 Pain in unspecified hip: Secondary | ICD-10-CM

## 2012-12-30 MED ORDER — CYCLOBENZAPRINE HCL 10 MG PO TABS: 10.0000 mg | ORAL_TABLET | Freq: Three times a day (TID) | ORAL | Status: DC | PRN

## 2012-12-30 NOTE — Patient Instructions (Signed)
Adductor Muscle Strain  with Rehab   The adductor muscles of the thigh are responsible for moving the leg across the body and are susceptible to muscle strains. A strain is an injury to a muscle or a tendon that attaches the muscle to a bone. Strains of the adductor muscles occur where the muscle tendons attach to the pelvic bone. A muscle strain may be a complete or partial tear of the muscle and may involve one or more of the adductor muscles.  These strains are usually classified as a grade 1 or 2 strain. A grade 1 strain has no obvious sign of tearing or stretching of the muscle or tendon, but may include significant inflammation. A grade 2 strain is a moderate strain in which the muscle or tendon has been partially torn and has been stretched. Grade 2 strains are usually accompanied with loss of strength. A grade 3 muscle strain rarely occurs in the adductor muscles. A grade 3 strain is a complete tear of the muscle or tendon.  SYMPTOMS   · Occasionally there is a sudden "pop" felt or heard in the groin or inner thigh at the time of injury.  · There may be pain, tenderness, swelling, warmth, or redness over the inner thigh and groin. This may be worsened by moving the hip (especially when spreading the legs or hips, pushing the legs against each other or kicking with the affected leg). There may be bruising (contusion) in the groin and inner thigh within 48 hours following the injury.  · There may be loss of fullness of the muscle with complete rupture (uncommon).  · Muscle spasm in the groin and inner thigh can occur.  CAUSES   · Prolonged overuse or a sudden increase in intensity, frequency, or duration of activity.  · Single episode of stressful overactivity, such as during kicking.  · Single violent blow or force to the inner thigh (less common).  RISK INCREASES WITH:  · Sports that require repeated kicking (soccer, martial arts, football), as well as sports that require the legs to be brought together  (gymnastics, horseback riding).  · Sports that require rapid acceleration (ice hockey, track and field).  · Poor strength and flexibility.  · Previous thigh injury.  PREVENTION   · Warm up and stretch properly before activity.  · Maintain physical fitness:  · Hip and thigh flexibility.  · Muscle strength and endurance.  · Cardiovascular fitness.  · Complete the entire course of rehabilitation after any lower extremity injury. Do this before returning to competition or practice. Follow suggestions of your caregiver.  PROGNOSIS   If treated properly, adductor muscle strains usually heal well within 2 to 6 weeks.  RELATED COMPLICATIONS   · Healing time will be prolonged if the condition is not appropriately treated. It needs adequate time to heal.  · Do not return to activity too soon. Recurrence of symptoms and reinjury are possible.  · If left untreated, the strain may progress to a complete tear (rare) or other injury caused by limping and favoring the injured leg.  · Prolonged disability is possible.  TREATMENT  Treatment initially involves ice and medication to help reduce pain and inflammation. Strength and stretching exercises are recommended to maintain strength and a full range of motion. Strenuous activities should be modified to prevent further injury. Using crutches for the first few days may help to lessen pain. On rare occasions, surgery is necessary to reattach the tendon to the bone. If pain becomes   persistent or chronic after more than 3 months of nonsurgical treatment, surgery may also be recommended.  MEDICATION   · If pain medication is necessary, nonsteroidal anti-inflammatory medications, such as aspirin and ibuprofen, or other minor pain relievers, such as acetaminophen, are often recommended.  · Do not take pain medication for 7 days before surgery or as advised.  · Prescription pain relievers may be given. Use only as directed and only as much as you need.  · Ointments applied to the skin may  be helpful.  · Corticosteroid injections may be given to reduce inflammation.  HEAT AND COLD  · Cold treatment (icing) relieves pain and reduces inflammation. Cold treatment should be applied for 10 to 15 minutes every 2 to 3 hours for inflammation and pain and immediately after any activity that aggravates your symptoms. Use ice packs or an ice massage.  · Heat treatment may be used prior to performing the stretching and strengthening activities prescribed by your caregiver, physical therapist, or athletic trainer. Use a heat pack or a warm soak.  SEEK MEDICAL CARE IF:   · Symptoms get worse or do not improve in 2 weeks, despite treatment.  · New, unexplained symptoms develop. (Drugs used in treatment may produce side effects.)  EXERCISES   RANGE OF MOTION (ROM) AND STRETCHING EXERCISES - Adductor Muscle Strain  These exercises may help you when beginning to rehabilitate your injury. Your symptoms may resolve with or without further involvement from your physician, physical therapist, or athletic trainer. While completing these exercises, remember:   · Restoring tissue flexibility helps normal motion to return to the joints. This allows healthier, less painful movement and activity.  · An effective stretch should be held for at least 30 seconds.  · A stretch should never be painful. You should only feel a gentle lengthening or release in the stretched tissue.  STRETCH - Adductors, Lunge  · While standing, spread your legs.  · Lean away from your right / left leg by bending your opposite knee. You may rest your hands on your thigh for balance.  · You should feel a stretch in your right / left inner thigh. Hold for __________ seconds.  Repeat __________ times. Complete this exercise __________ times per day.   STRETCH - Adductors, Standing  · Place your right / left foot on a counter or stable table. Turn away from your leg so both hips line up with your right / left leg.  · Keeping your hips facing forward, slowly  bend your opposite leg until you feel a gentle stretch on the inside of your right / left thigh.  · Hold for __________ seconds.  Repeat __________ times. Complete this exercise __________ times per day.   STRETCH - Hip Adductors, Sitting   · Sit on the floor and place the bottoms of your feet together. Keep your chest up and look straight ahead to keep your back in proper alignment. Slide your feet in towards your body as far as you can without rounding your back or increasing any discomfort.  · Gently push down on your knees until you feel a gentle stretch in your inner thighs. Hold this position for __________ seconds.  Repeat __________ times. Complete this exercise __________ times per day.   STRETCH - Hamstrings/Adductors, V-Sit  · Sit on the floor with your legs extended in a large "V," keeping your knees straight.  · With your head and chest upright, bend at your waist reaching for your left foot   to stretch your right adductors.  · You should feel a stretch in your right inner thigh. Hold for __________ seconds.  · Return to the upright position to relax your leg muscles.  · Continuing to keep your chest upright, bend straight forward at your waist to stretch your hamstrings.  · You should feel a stretch behind both of your thighs and/or knees. Hold for __________ seconds.  · Return to the upright position to relax your leg muscles.  · Repeat steps 2 through 4 for the right leg to stretch your left inner thigh.  Repeat __________ times. Complete this exercise __________ times per day.   STRENGTHENING EXERCISES - Adductor Muscle Strain  These exercises may help you when beginning to rehabilitate your injury. They may resolve your symptoms with or without further involvement from your physician, physical therapist, or athletic trainer. While completing these exercises, remember:   · Muscles can gain both the endurance and the strength needed for everyday activities through controlled exercises.  · Complete  these exercises as instructed by your physician, physical therapist, or athletic trainer. Progress the resistance and repetitions only as guided.  · You may experience muscle soreness or fatigue, but the pain or discomfort you are trying to eliminate should never worsen during these exercises. If this pain does worsen, stop and make certain you are following the directions exactly. If the pain is still present after adjustments, discontinue the exercise until you can discuss the trouble with your caregiver.  STRENGTH - Hip Adductors, Isometrics   · Sit on a firm chair so that your knees are about the same height as your hips.  · Place a large ball, firm pillow, or rolled up bath towel between your thighs.  · Squeeze your thighs together, gradually building tension. Hold for __________ seconds.  · Release the tension gradually and allow your inner thigh muscles to relax completely before repeating the exercise.  Repeat __________ times. Complete this exercise __________ times per day.   STRENGTH - Hip Adductors, Straight Leg Raises   · Lie on your side so that your head, shoulders, knee and hip line up. You may place your upper foot in front to help maintain your balance. Your right / left leg should be on the bottom.  · Roll your hips slightly forward, so that your hips are stacked directly over each other and your right / left knee is facing forward.  · Tense the muscles in your inner thigh and lift your bottom leg 4-6 inches. Hold this position for __________ seconds.  · Slowly lower your leg to the starting position. Allow the muscles to fully relax before beginning the next repetition.  Repeat __________ times. Complete this exercise __________ times per day.   Document Released: 06/16/2005 Document Revised: 09/08/2011 Document Reviewed: 09/28/2008  [employer]® Patient Information ©2014 [employer].

## 2012-12-30 NOTE — Progress Notes (Signed)
  Subjective:    Patient ID: Alexis Craig, female    DOB: 1981/05/24, 32 y.o.   MRN: [id number]  HPI  32 year old patient who presents with a chief complaint of left hip pain. This has been present for about 6 months it has worsened over the past 2 weeks especially the past several days pain is aggravated by movement. No history of trauma or any unusual activities.  She has been seen by dermatology and felt to have a fixed drug reaction secondary to ibuprofen. This resulted in a bullous dermatitis and she was told to avoid all NSAIDs  Past Medical History  Diagnosis Date  . Anal fissure 04/09/2009  . ANXIETY 03/09/2008  . Headache(784.0) 08/23/2007  . INSOMNIA 10/05/2007  . INTENTION TREMOR 06/13/2008  . NEPHROLITHIASIS, HX OF 08/23/2007  . RECTAL BLEEDING 04/09/2009  . RESTLESS LEG SYNDROME 01/22/2009    History   Social History  . Marital Status: Single    Spouse Name: N/A    Number of Children: N/A  . Years of Education: N/A   Occupational History  . Not on file.   Social History Main Topics  . Smoking status: Former Smoker    Types: Cigarettes  . Smokeless tobacco: Former Neurosurgeon    Quit date: 06/24/2011  . Alcohol Use: Not on file  . Drug Use: No  . Sexually Active: Not on file   Other Topics Concern  . Not on file   Social History Narrative  . No narrative on file    History reviewed. No pertinent past surgical history.  Family History  Problem Relation Age of Onset  . Diabetes Mother   . Thyroid disease Mother   . Hyperlipidemia Father     Allergies  Allergen Reactions  . Nsaids Rash    Blisters also    Current Outpatient Prescriptions on File Prior to Visit  Medication Sig Dispense Refill  . Levonorgestrel-Ethinyl Estradiol (SEASONIQUE) 0.15-0.03 &0.01 MG tablet Take 1 tablet by mouth daily.      Marland Kitchen LORazepam (ATIVAN) 0.5 MG tablet TAKE 1 TABLET BY MOUTH TWICE DAILY AS NEEDED FOR ANXIETY  30 tablet  1  . polyethylene glycol powder (GLYCOLAX/MIRALAX)  powder Take 17 g by mouth daily.        . pramipexole (MIRAPEX) 0.25 MG tablet Take 1 tablet (0.25 mg total) by mouth at bedtime.  90 tablet  4  . propranolol (INDERAL) 40 MG tablet Take 1 tablet (40 mg total) by mouth 3 (three) times daily.  30 tablet  4   No current facility-administered medications on file prior to visit.    BP 120/80  Pulse 94  Temp(Src) 98.5 F (36.9 C) (Oral)  Resp 20  Wt 152 lb 12.8 oz (69.31 kg)  BMI 27.07 kg/m2  SpO2 98%     Review of Systems  Musculoskeletal: Positive for arthralgias.       Left hip pain       Objective:   Physical Exam  Constitutional: She appears well-developed and well-nourished. No distress.  Musculoskeletal: Normal range of motion.  No tenderness over the greater trochanter Range of motion of the hip intact. Slight discomfort with internal and external rotation  Normal gait          Assessment & Plan:   Hip pain. Suspect musculoligamentous. Will try a at bedtime dose relaxer. Stretching and range of motion exercises discussed

## 2013-04-18 ENCOUNTER — Other Ambulatory Visit: Payer: Self-pay | Admitting: Internal Medicine

## 2013-07-12 ENCOUNTER — Other Ambulatory Visit: Payer: Self-pay | Admitting: Internal Medicine

## 2013-08-12 ENCOUNTER — Encounter: Payer: Self-pay | Admitting: *Deleted

## 2013-08-12 ENCOUNTER — Ambulatory Visit: Payer: BC Managed Care – PPO | Admitting: Internal Medicine

## 2013-08-15 ENCOUNTER — Other Ambulatory Visit: Payer: Self-pay | Admitting: Internal Medicine

## 2013-08-15 ENCOUNTER — Ambulatory Visit (INDEPENDENT_AMBULATORY_CARE_PROVIDER_SITE_OTHER): Payer: BC Managed Care – PPO | Admitting: Internal Medicine

## 2013-08-15 ENCOUNTER — Encounter: Payer: Self-pay | Admitting: Internal Medicine

## 2013-08-15 VITALS — BP 140/80 | HR 82 | Temp 98.5°F | Resp 20 | Ht 63.0 in | Wt 155.0 lb

## 2013-08-15 DIAGNOSIS — F411 Generalized anxiety disorder: Secondary | ICD-10-CM

## 2013-08-15 DIAGNOSIS — G4700 Insomnia, unspecified: Secondary | ICD-10-CM

## 2013-08-15 DIAGNOSIS — G2581 Restless legs syndrome: Secondary | ICD-10-CM

## 2013-08-15 MED ORDER — LORAZEPAM 0.5 MG PO TABS: ORAL_TABLET | ORAL | Status: DC

## 2013-08-15 MED ORDER — TRAZODONE HCL 50 MG PO TABS: 25.0000 mg | ORAL_TABLET | Freq: Every evening | ORAL | Status: DC | PRN

## 2013-08-15 NOTE — Patient Instructions (Signed)
It is important that you exercise regularly, at least 20 minutes 3 to 4 times per week.  If you develop chest pain or shortness of breath seek  medical attention.  Return in 6 months for follow-up  

## 2013-08-15 NOTE — Progress Notes (Signed)
Subjective:    Patient ID: Alexis Craig, female    DOB: 09-03-1980, 33 y.o.   MRN: [id number]  HPI  33 year old patient who is seen today in followup.  She is doing quite well.  She has had some issues with occasional insomnia and this is a problem now.  She has done well on trazodone in the past, although did experience some weight gain on this medication.  She takes the lorazepam sparingly.  She will be married in 6 weeks  Past Medical History  Diagnosis Date  . Anal fissure 04/09/2009  . ANXIETY 03/09/2008  . Headache(784.0) 08/23/2007  . INSOMNIA 10/05/2007  . INTENTION TREMOR 06/13/2008  . NEPHROLITHIASIS, HX OF 08/23/2007  . RECTAL BLEEDING 04/09/2009  . RESTLESS LEG SYNDROME 01/22/2009    History   Social History  . Marital Status: Single    Spouse Name: N/A    Number of Children: N/A  . Years of Education: N/A   Occupational History  . Not on file.   Social History Main Topics  . Smoking status: Former Smoker    Types: Cigarettes  . Smokeless tobacco: Former Systems developer    Quit date: 06/24/2011  . Alcohol Use: Not on file  . Drug Use: No  . Sexual Activity: Not on file   Other Topics Concern  . Not on file   Social History Narrative  . No narrative on file    History reviewed. No pertinent past surgical history.  Family History  Problem Relation Age of Onset  . Diabetes Mother   . Thyroid disease Mother   . Hyperlipidemia Father     Allergies  Allergen Reactions  . Nsaids Rash    Blisters also  . Ibuprofen     Fixed drug reaction with bullous dermatitis    Current Outpatient Prescriptions on File Prior to Visit  Medication Sig Dispense Refill  . acetaminophen (TYLENOL) 500 MG tablet Take 500 mg by mouth every 6 (six) hours as needed for pain.      . cyclobenzaprine (FLEXERIL) 10 MG tablet Take 1 tablet (10 mg total) by mouth 3 (three) times daily as needed for muscle spasms.  30 tablet  0  . Levonorgestrel-Ethinyl Estradiol (SEASONIQUE) 0.15-0.03 &0.01  MG tablet Take 1 tablet by mouth daily.      Marland Kitchen LORazepam (ATIVAN) 0.5 MG tablet TAKE 1 TABLET BY MOUTH TWICE DAILY AS NEEDED  30 tablet  2  . polyethylene glycol powder (GLYCOLAX/MIRALAX) powder Take 17 g by mouth daily.        . pramipexole (MIRAPEX) 0.25 MG tablet TAKE 1 TABLET BY MOUTH AT BEDTIME  90 tablet  1  . propranolol (INDERAL) 40 MG tablet TAKE 1 TABLET BY MOUTH THREE TIMES DAILY  30 tablet  3   No current facility-administered medications on file prior to visit.    BP 140/80  Pulse 82  Temp(Src) 98.5 F (36.9 C) (Oral)  Resp 20  Ht 5\' 3"  (1.6 m)  Wt 155 lb (70.308 kg)  BMI 27.46 kg/m2  SpO2 99%      Review of Systems  Constitutional: Negative.   HENT: Negative for congestion, dental problem, hearing loss, rhinorrhea, sinus pressure, sore throat and tinnitus.   Eyes: Negative for pain, discharge and visual disturbance.  Respiratory: Negative for cough and shortness of breath.   Cardiovascular: Negative for chest pain, palpitations and leg swelling.  Gastrointestinal: Negative for nausea, vomiting, abdominal pain, diarrhea, constipation, blood in stool and abdominal distention.  Genitourinary: Negative for  dysuria, urgency, frequency, hematuria, flank pain, vaginal bleeding, vaginal discharge, difficulty urinating, vaginal pain and pelvic pain.  Musculoskeletal: Negative for arthralgias, gait problem and joint swelling.  Skin: Negative for rash.  Neurological: Negative for dizziness, syncope, speech difficulty, weakness, numbness and headaches.  Hematological: Negative for adenopathy.  Psychiatric/Behavioral: Positive for sleep disturbance. Negative for behavioral problems, dysphoric mood and agitation. The patient is nervous/anxious.        Objective:   Physical Exam  Constitutional: She is oriented to person, place, and time. She appears well-developed and well-nourished.  HENT:  Head: Normocephalic.  Right Ear: External ear normal.  Left Ear: External ear  normal.  Mouth/Throat: Oropharynx is clear and moist.  Eyes: Conjunctivae and EOM are normal. Pupils are equal, round, and reactive to light.  Neck: Normal range of motion. Neck supple. No thyromegaly present.  Cardiovascular: Normal rate, regular rhythm, normal heart sounds and intact distal pulses.   Pulmonary/Chest: Effort normal and breath sounds normal.  Abdominal: Soft. Bowel sounds are normal. She exhibits no mass. There is no tenderness.  Musculoskeletal: Normal range of motion.  Lymphadenopathy:    She has no cervical adenopathy.  Neurological: She is alert and oriented to person, place, and time.  Skin: Skin is warm and dry. No rash noted.  Psychiatric: She has a normal mood and affect. Her behavior is normal.          Assessment & Plan:   Anxiety disorder.  Stable we'll continue when necessary lorazepam Insomnia.  We'll resume trazodone to take when necessary only  Restless leg syndrome, stable  Recheck 6 months

## 2013-08-15 NOTE — Progress Notes (Signed)
Pre-visit discussion using our clinic review tool. No additional management support is needed unless otherwise documented below in the visit note.  

## 2013-12-07 ENCOUNTER — Other Ambulatory Visit: Payer: Self-pay | Admitting: Internal Medicine

## 2014-01-03 ENCOUNTER — Other Ambulatory Visit: Payer: Self-pay | Admitting: Internal Medicine

## 2014-02-02 ENCOUNTER — Ambulatory Visit (INDEPENDENT_AMBULATORY_CARE_PROVIDER_SITE_OTHER): Payer: BC Managed Care – PPO | Admitting: *Deleted

## 2014-02-02 DIAGNOSIS — Z23 Encounter for immunization: Secondary | ICD-10-CM

## 2014-02-21 ENCOUNTER — Encounter: Payer: Self-pay | Admitting: Internal Medicine

## 2014-03-03 ENCOUNTER — Other Ambulatory Visit: Payer: Self-pay | Admitting: Internal Medicine

## 2014-03-27 ENCOUNTER — Other Ambulatory Visit: Payer: Self-pay | Admitting: Internal Medicine

## 2014-04-04 ENCOUNTER — Encounter: Payer: Self-pay | Admitting: Internal Medicine

## 2014-04-04 ENCOUNTER — Ambulatory Visit (INDEPENDENT_AMBULATORY_CARE_PROVIDER_SITE_OTHER): Payer: BC Managed Care – PPO | Admitting: Internal Medicine

## 2014-04-04 VITALS — BP 152/90 | HR 98 | Temp 98.7°F | Resp 20 | Ht 63.0 in | Wt 179.0 lb

## 2014-04-04 DIAGNOSIS — J069 Acute upper respiratory infection, unspecified: Secondary | ICD-10-CM

## 2014-04-04 DIAGNOSIS — B9789 Other viral agents as the cause of diseases classified elsewhere: Principal | ICD-10-CM

## 2014-04-04 MED ORDER — HYDROCODONE-HOMATROPINE 5-1.5 MG/5ML PO SYRP: 5.0000 mL | ORAL_SOLUTION | Freq: Four times a day (QID) | ORAL | Status: DC | PRN

## 2014-04-04 NOTE — Progress Notes (Signed)
   Subjective:    Patient ID: Alexis Craig, female    DOB: 1980-07-13, 33 y.o.   MRN: [id number]  HPI  Wt Readings from Last 3 Encounters:  04/04/14 179 lb (81.194 kg)  08/15/13 155 lb (70.308 kg)  12/30/12 152 lb 12.8 oz (69.31 kg)    Review of Systems     Objective:   Physical Exam        Assessment & Plan:

## 2014-04-04 NOTE — Progress Notes (Signed)
Subjective:    Patient ID: Alexis Craig, female    DOB: 1981/04/05, 33 y.o.   MRN: [id number]  HPI  33 year old patient, who presents with a two-day history of sore throat, significant refractory, nonproductive cough.  She is felt that fevers with some intermittent chills.  She generally feels unwell. She has been exposed to a numerous children with acute febrile illness.  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  Denies any chest pain or shortness or breath  Past Medical History  Diagnosis Date  . Anal fissure 04/09/2009  . ANXIETY 03/09/2008  . Headache(784.0) 08/23/2007  . INSOMNIA 10/05/2007  . INTENTION TREMOR 06/13/2008  . NEPHROLITHIASIS, HX OF 08/23/2007  . RECTAL BLEEDING 04/09/2009  . RESTLESS LEG SYNDROME 01/22/2009    History   Social History  . Marital Status: Single    Spouse Name: N/A    Number of Children: N/A  . Years of Education: N/A   Occupational History  . Not on file.   Social History Main Topics  . Smoking status: Former Smoker    Types: Cigarettes  . Smokeless tobacco: Former Systems developer    Quit date: 06/24/2011  . Alcohol Use: Not on file  . Drug Use: No  . Sexual Activity: Not on file   Other Topics Concern  . Not on file   Social History Narrative  . No narrative on file    History reviewed. No pertinent past surgical history.  Family History  Problem Relation Age of Onset  . Diabetes Mother   . Thyroid disease Mother   . Hyperlipidemia Father     Allergies  Allergen Reactions  . Nsaids Rash    Blisters also  . Ibuprofen     Fixed drug reaction with bullous dermatitis    Current Outpatient Prescriptions on File Prior to Visit  Medication Sig Dispense Refill  . acetaminophen (TYLENOL) 500 MG tablet Take 500 mg by mouth every 6 (six) hours as needed for pain.      . cyclobenzaprine (FLEXERIL) 10 MG tablet TAKE 1 TABLET BY MOUTH THREE TIMES DAILY AS NEEDED FOR MUSCLE SPASMS  30 tablet  5  .  Levonorgestrel-Ethinyl Estradiol (SEASONIQUE) 0.15-0.03 &0.01 MG tablet Take 1 tablet by mouth daily.      Marland Kitchen LORazepam (ATIVAN) 0.5 MG tablet TAKE 1 TABLET BY MOUTH TWICE DAILY AS NEEDED  60 tablet  2  . polyethylene glycol powder (GLYCOLAX/MIRALAX) powder Take 17 g by mouth daily.        . pramipexole (MIRAPEX) 0.25 MG tablet TAKE 1 TABLET BY MOUTH EVERY NIGHT AT BEDTIME  90 tablet  1  . propranolol (INDERAL) 40 MG tablet TAKE 1 TABLET BY MOUTH THREE TIMES DAILY  90 tablet  0  . traZODone (DESYREL) 50 MG tablet TAKE 1/2 TO 1 TABLET BY MOUTH EVERY NIGHT AT BEDTIME AS NEEDED FOR SLEEP  30 tablet  5   No current facility-administered medications on file prior to visit.    BP 152/90  Pulse 98  Temp(Src) 98.7 F (37.1 C) (Oral)  Resp 20  Ht 5\' 3"  (1.6 m)  Wt 179 lb (81.194 kg)  BMI 31.72 kg/m2  SpO2 98%     Review of Systems  Constitutional: Positive for chills, activity change, appetite change and fatigue.  HENT: Positive for congestion, rhinorrhea and sore throat. Negative for dental problem, hearing loss, sinus pressure and tinnitus.   Eyes: Negative for pain, discharge and visual disturbance.  Respiratory: Positive for  cough. Negative for shortness of breath.   Cardiovascular: Negative for chest pain, palpitations and leg swelling.  Gastrointestinal: Negative for nausea, vomiting, abdominal pain, diarrhea, constipation, blood in stool and abdominal distention.  Genitourinary: Negative for dysuria, urgency, frequency, hematuria, flank pain, vaginal bleeding, vaginal discharge, difficulty urinating, vaginal pain and pelvic pain.  Musculoskeletal: Negative for arthralgias, gait problem and joint swelling.  Skin: Negative for rash.  Neurological: Negative for dizziness, syncope, speech difficulty, weakness, numbness and headaches.  Hematological: Negative for adenopathy.  Psychiatric/Behavioral: Negative for behavioral problems, dysphoric mood and agitation. The patient is not  nervous/anxious.        Objective:   Physical Exam  Constitutional: She is oriented to person, place, and time. She appears well-developed and well-nourished.  HENT:  Head: Normocephalic.  Right Ear: External ear normal.  Left Ear: External ear normal.  Oropharynx is erythematous without exudate  Eyes: Conjunctivae and EOM are normal. Pupils are equal, round, and reactive to light.  Neck: Normal range of motion. Neck supple. No thyromegaly present.  Cardiovascular: Normal rate, regular rhythm, normal heart sounds and intact distal pulses.   Pulmonary/Chest: Effort normal and breath sounds normal.  Abdominal: Soft. Bowel sounds are normal. She exhibits no mass. There is no tenderness.  Musculoskeletal: Normal range of motion.  Lymphadenopathy:    She has no cervical adenopathy.  Neurological: She is alert and oriented to person, place, and time.  Skin: Skin is warm and dry. No rash noted.  Psychiatric: She has a normal mood and affect. Her behavior is normal.          Assessment & Plan:  Viral URI with cough Pharyngitis  We'll treat symptomatically

## 2014-04-04 NOTE — Patient Instructions (Signed)
Acute bronchitis symptoms for less than 10 days are generally not helped by antibiotics.  Take over-the-counter expectorants and cough medications such as  Mucinex DM.  Call if there is no improvement in 5 to 7 days or if  you develop worsening cough, fever, or new symptoms, such as shortness of breath or chest pain.   

## 2014-04-04 NOTE — Progress Notes (Signed)
Pre visit review using our clinic review tool, if applicable. No additional management support is needed unless otherwise documented below in the visit note. 

## 2014-04-10 ENCOUNTER — Telehealth: Payer: Self-pay | Admitting: Internal Medicine

## 2014-04-10 NOTE — Telephone Encounter (Signed)
Please refill, cough medicine

## 2014-04-10 NOTE — Telephone Encounter (Signed)
Pt is not feeling any better and is almost done with her cough medication. Thinks she may need an antibiotic and additional cough med.

## 2014-04-10 NOTE — Telephone Encounter (Signed)
Please advise 

## 2014-04-11 MED ORDER — HYDROCODONE-HOMATROPINE 5-1.5 MG/5ML PO SYRP: 5.0000 mL | ORAL_SOLUTION | Freq: Four times a day (QID) | ORAL | Status: AC | PRN

## 2014-04-11 NOTE — Telephone Encounter (Signed)
Pt notified Rx for cough syrup ready for pickup, will be at the front desk. Rx printed and signed.

## 2014-05-08 ENCOUNTER — Other Ambulatory Visit: Payer: Self-pay | Admitting: Internal Medicine

## 2014-06-27 ENCOUNTER — Ambulatory Visit (INDEPENDENT_AMBULATORY_CARE_PROVIDER_SITE_OTHER): Payer: BC Managed Care – PPO | Admitting: Family Medicine

## 2014-06-27 ENCOUNTER — Encounter: Payer: Self-pay | Admitting: Family Medicine

## 2014-06-27 VITALS — BP 138/78 | HR 117 | Temp 98.0°F | Ht 63.0 in | Wt 184.4 lb

## 2014-06-27 DIAGNOSIS — J320 Chronic maxillary sinusitis: Secondary | ICD-10-CM

## 2014-06-27 MED ORDER — AMOXICILLIN-POT CLAVULANATE 875-125 MG PO TABS: 1.0000 | ORAL_TABLET | Freq: Two times a day (BID) | ORAL | Status: DC

## 2014-06-27 MED ORDER — HYDROCODONE-HOMATROPINE 5-1.5 MG/5ML PO SYRP: 5.0000 mL | ORAL_SOLUTION | Freq: Three times a day (TID) | ORAL | Status: DC | PRN

## 2014-06-27 NOTE — Progress Notes (Signed)
Pre visit review using our clinic review tool, if applicable. No additional management support is needed unless otherwise documented below in the visit note. 

## 2014-06-27 NOTE — Progress Notes (Signed)
HPI:  URI: -started: 3 weeks ago -symptoms:nasal congestion, sore throat, cough, drainage, maxillary sinus pain and upper tooth pain, SOB, intermittent low grade temp -denies:NVD, tooth pain -has tried:  -sick contacts/travel/risks: denies flu exposure, tick exposure or or Ebola risks, lots of sick contacts at work -Hx of: denies hx of asthma, allergies, sinus infection or smoking  ROS: See pertinent positives and negatives per HPI.  Past Medical History  Diagnosis Date  . Anal fissure 04/09/2009  . ANXIETY 03/09/2008  . Headache(784.0) 08/23/2007  . INSOMNIA 10/05/2007  . INTENTION TREMOR 06/13/2008  . NEPHROLITHIASIS, HX OF 08/23/2007  . RECTAL BLEEDING 04/09/2009  . RESTLESS LEG SYNDROME 01/22/2009    No past surgical history on file.  Family History  Problem Relation Age of Onset  . Diabetes Mother   . Thyroid disease Mother   . Hyperlipidemia Father     History   Social History  . Marital Status: Single    Spouse Name: N/A    Number of Children: N/A  . Years of Education: N/A   Social History Main Topics  . Smoking status: Former Smoker    Types: Cigarettes  . Smokeless tobacco: Former Systems developer    Quit date: 06/24/2011  . Alcohol Use: None  . Drug Use: No  . Sexual Activity: None   Other Topics Concern  . None   Social History Narrative    Current outpatient prescriptions: acetaminophen (TYLENOL) 500 MG tablet, Take 500 mg by mouth every 6 (six) hours as needed for pain., Disp: , Rfl: ;  cyclobenzaprine (FLEXERIL) 10 MG tablet, TAKE 1 TABLET BY MOUTH THREE TIMES DAILY AS NEEDED FOR MUSCLE SPASMS, Disp: 30 tablet, Rfl: 5;  Levonorgestrel-Ethinyl Estradiol (SEASONIQUE) 0.15-0.03 &0.01 MG tablet, Take 1 tablet by mouth daily., Disp: , Rfl:  LORazepam (ATIVAN) 0.5 MG tablet, TAKE 1 TABLET BY MOUTH TWICE DAILY AS NEEDED, Disp: 60 tablet, Rfl: 2;  polyethylene glycol powder (GLYCOLAX/MIRALAX) powder, Take 17 g by mouth daily.  , Disp: , Rfl: ;  pramipexole (MIRAPEX)  0.25 MG tablet, TAKE 1 TABLET BY MOUTH EVERY NIGHT AT BEDTIME, Disp: 90 tablet, Rfl: 1;  propranolol (INDERAL) 40 MG tablet, TAKE 1 TABLET BY MOUTH THREE TIMES DAILY, Disp: 90 tablet, Rfl: 1 traZODone (DESYREL) 50 MG tablet, TAKE 1/2 TO 1 TABLET BY MOUTH EVERY NIGHT AT BEDTIME AS NEEDED FOR SLEEP, Disp: 30 tablet, Rfl: 5;  amoxicillin-clavulanate (AUGMENTIN) 875-125 MG per tablet, Take 1 tablet by mouth 2 (two) times daily., Disp: 20 tablet, Rfl: 0;  HYDROcodone-homatropine (HYCODAN) 5-1.5 MG/5ML syrup, Take 5 mLs by mouth every 8 (eight) hours as needed for cough., Disp: 120 mL, Rfl: 0  EXAM:  Filed Vitals:   06/27/14 1407  BP: 138/78  Pulse: 117  Temp: 98 F (36.7 C)    Body mass index is 32.67 kg/(m^2).  GENERAL: vitals reviewed and listed above, alert, oriented, appears well hydrated and in no acute distress  HEENT: atraumatic, conjunttiva clear, no obvious abnormalities on inspection of external nose and ears, normal appearance of ear canals and TMs, thick white nasal congestion on th R, mild post oropharyngeal erythema with PND, no tonsillar edema or exudate, R max sinus TTP  NECK: no obvious masses on inspection  LUNGS: clear to auscultation bilaterally, no wheezes, rales or rhonchi, good air movement  CV: HRRR, no peripheral edema  MS: moves all extremities without noticeable abnormality  PSYCH: pleasant and cooperative, no obvious depression or anxiety  ASSESSMENT AND PLAN:  Discussed the following assessment  and plan:  Maxillary sinusitis, unspecified chronicity - Plan: amoxicillin-clavulanate (AUGMENTIN) 875-125 MG per tablet, HYDROcodone-homatropine (HYCODAN) 5-1.5 MG/5ML syrup  -We discussed potential etiologies, with sinusitis being likely, and advised supportive care and monitoring. We discussed treatment side effects, likely course, transmission, and signs of developing a serious worsening illness. -of course, we advised to return or notify a doctor immediately if  symptoms worsen or persist or new concerns arise.    There are no Patient Instructions on file for this visit.   Colin Benton R.

## 2014-08-22 ENCOUNTER — Other Ambulatory Visit (INDEPENDENT_AMBULATORY_CARE_PROVIDER_SITE_OTHER): Payer: BLUE CROSS/BLUE SHIELD

## 2014-08-22 DIAGNOSIS — R7989 Other specified abnormal findings of blood chemistry: Secondary | ICD-10-CM

## 2014-08-22 DIAGNOSIS — Z0000 Encounter for general adult medical examination without abnormal findings: Secondary | ICD-10-CM

## 2014-08-22 LAB — POCT URINALYSIS DIP (MANUAL ENTRY)
BILIRUBIN UA: NEGATIVE
GLUCOSE UA: NEGATIVE
Ketones, POC UA: NEGATIVE
Nitrite, UA: NEGATIVE
Protein Ur, POC: NEGATIVE
SPEC GRAV UA: 1.01
Urobilinogen, UA: 0.2
pH, UA: 7

## 2014-08-22 LAB — CBC WITH DIFFERENTIAL/PLATELET
BASOS PCT: 0.4 % (ref 0.0–3.0)
Basophils Absolute: 0 10*3/uL (ref 0.0–0.1)
EOS PCT: 1.6 % (ref 0.0–5.0)
Eosinophils Absolute: 0.1 10*3/uL (ref 0.0–0.7)
HCT: 42.8 % (ref 36.0–46.0)
Hemoglobin: 14.5 g/dL (ref 12.0–15.0)
LYMPHS PCT: 30.8 % (ref 12.0–46.0)
Lymphs Abs: 2.6 10*3/uL (ref 0.7–4.0)
MCHC: 33.9 g/dL (ref 30.0–36.0)
MCV: 88.9 fl (ref 78.0–100.0)
Monocytes Absolute: 0.9 10*3/uL (ref 0.1–1.0)
Monocytes Relative: 10.4 % (ref 3.0–12.0)
NEUTROS PCT: 56.8 % (ref 43.0–77.0)
Neutro Abs: 4.7 10*3/uL (ref 1.4–7.7)
Platelets: 354 10*3/uL (ref 150.0–400.0)
RBC: 4.82 Mil/uL (ref 3.87–5.11)
RDW: 13.1 % (ref 11.5–15.5)
WBC: 8.3 10*3/uL (ref 4.0–10.5)

## 2014-08-22 LAB — HEPATIC FUNCTION PANEL
ALBUMIN: 3.9 g/dL (ref 3.5–5.2)
ALK PHOS: 41 U/L (ref 39–117)
ALT: 24 U/L (ref 0–35)
AST: 16 U/L (ref 0–37)
BILIRUBIN DIRECT: 0.1 mg/dL (ref 0.0–0.3)
Total Bilirubin: 0.3 mg/dL (ref 0.2–1.2)
Total Protein: 6.7 g/dL (ref 6.0–8.3)

## 2014-08-22 LAB — BASIC METABOLIC PANEL
BUN: 11 mg/dL (ref 6–23)
CALCIUM: 9.6 mg/dL (ref 8.4–10.5)
CO2: 24 meq/L (ref 19–32)
Chloride: 106 mEq/L (ref 96–112)
Creatinine, Ser: 0.86 mg/dL (ref 0.40–1.20)
GFR: 79.55 mL/min (ref >60.00)
GLUCOSE: 93 mg/dL (ref 70–99)
POTASSIUM: 3.6 meq/L (ref 3.5–5.1)
SODIUM: 138 meq/L (ref 135–145)

## 2014-08-22 LAB — LIPID PANEL
CHOL/HDL RATIO: 4
Cholesterol: 209 mg/dL — ABNORMAL HIGH (ref 0–200)
HDL: 47.6 mg/dL (ref >39.00)
NONHDL: 161.4
TRIGLYCERIDES: 207 mg/dL — AB (ref 0.0–149.0)
VLDL: 41.4 mg/dL — ABNORMAL HIGH (ref 0.0–40.0)

## 2014-08-22 LAB — TSH: TSH: 2.07 u[IU]/mL (ref 0.35–4.50)

## 2014-08-22 LAB — LDL CHOLESTEROL, DIRECT: Direct LDL: 146 mg/dL

## 2014-08-29 ENCOUNTER — Encounter: Payer: Self-pay | Admitting: Internal Medicine

## 2014-08-29 ENCOUNTER — Ambulatory Visit (INDEPENDENT_AMBULATORY_CARE_PROVIDER_SITE_OTHER): Payer: BLUE CROSS/BLUE SHIELD | Admitting: Internal Medicine

## 2014-08-29 VITALS — BP 150/90 | HR 90 | Temp 98.7°F | Resp 20 | Ht 64.5 in | Wt 176.0 lb

## 2014-08-29 DIAGNOSIS — Z0000 Encounter for general adult medical examination without abnormal findings: Secondary | ICD-10-CM

## 2014-08-29 NOTE — Patient Instructions (Signed)
Avoids foods high in acid such as tomatoes citrus juices, and spicy foods.  Avoid eating within two hours of lying down or before exercising.  Do not overheat.  Try smaller more frequent meals.  If symptoms persist, elevate the head of her bed 12 inches while sleeping.    It is important that you exercise regularly, at least 20 minutes 3 to 4 times per week.  If you develop chest pain or shortness of breath seek  medical attention.    Gastroesophageal Reflux Disease, Adult Gastroesophageal reflux disease (GERD) happens when acid from your stomach flows up into the esophagus. When acid comes in contact with the esophagus, the acid causes soreness (inflammation) in the esophagus. Over time, GERD may create small holes (ulcers) in the lining of the esophagus. CAUSES   Increased body weight. This puts pressure on the stomach, making acid rise from the stomach into the esophagus.  Smoking. This increases acid production in the stomach.  Drinking alcohol. This causes decreased pressure in the lower esophageal sphincter (valve or ring of muscle between the esophagus and stomach), allowing acid from the stomach into the esophagus.  Late evening meals and a full stomach. This increases pressure and acid production in the stomach.  A malformed lower esophageal sphincter. Sometimes, no cause is found. SYMPTOMS   Burning pain in the lower part of the mid-chest behind the breastbone and in the mid-stomach area. This may occur twice a week or more often.  Trouble swallowing.  Sore throat.  Dry cough.  Asthma-like symptoms including chest tightness, shortness of breath, or wheezing. DIAGNOSIS  Your caregiver may be able to diagnose GERD based on your symptoms. In some cases, X-rays and other tests may be done to check for complications or to check the condition of your stomach and esophagus. TREATMENT  Your caregiver may recommend over-the-counter or prescription medicines to help decrease acid  production. Ask your caregiver before starting or adding any new medicines.  HOME CARE INSTRUCTIONS   Change the factors that you can control. Ask your caregiver for guidance concerning weight loss, quitting smoking, and alcohol consumption.  Avoid foods and drinks that make your symptoms worse, such as:  Caffeine or alcoholic drinks.  Chocolate.  Peppermint or mint flavorings.  Garlic and onions.  Spicy foods.  Citrus fruits, such as oranges, lemons, or limes.  Tomato-based foods such as sauce, chili, salsa, and pizza.  Fried and fatty foods.  Avoid lying down for the 3 hours prior to your bedtime or prior to taking a nap.  Eat small, frequent meals instead of large meals.  Wear loose-fitting clothing. Do not wear anything tight around your waist that causes pressure on your stomach.  Raise the head of your bed 6 to 8 inches with wood blocks to help you sleep. Extra pillows will not help.  Only take over-the-counter or prescription medicines for pain, discomfort, or fever as directed by your caregiver.  Do not take aspirin, ibuprofen, or other nonsteroidal anti-inflammatory drugs (NSAIDs). SEEK IMMEDIATE MEDICAL CARE IF:   You have pain in your arms, neck, jaw, teeth, or back.  Your pain increases or changes in intensity or duration.  You develop nausea, vomiting, or sweating (diaphoresis).  You develop shortness of breath, or you faint.  Your vomit is green, yellow, black, or looks like coffee grounds or blood.  Your stool is red, bloody, or black. These symptoms could be signs of other problems, such as heart disease, gastric bleeding, or esophageal bleeding. MAKE SURE  YOU:   Understand these instructions.  Will watch your condition.  Will get help right away if you are not doing well or get worse. Document Released: 03/26/2005 Document Revised: 09/08/2011 Document Reviewed: 01/03/2011 [facility] Patient Information 2015 [place], [place]. This information is  not intended to replace advice given to you by your health care provider. Make sure you discuss any questions you have with your health care provider. Heartburn During Pregnancy  Heartburn is a burning sensation in the chest caused by stomach acid backing up into the esophagus. Heartburn is common in pregnancy because a certain hormone (progesterone) is released when a woman is pregnant. The progesterone hormone may relax the valve that separates the esophagus from the stomach. This allows acid to go up into the esophagus, causing heartburn. Heartburn may also happen in pregnancy because the enlarging uterus pushes up on the stomach, which pushes more acid into the esophagus. This is especially true in the later stages of pregnancy. Heartburn problems usually go away after giving birth. CAUSES  Heartburn is caused by stomach acid backing up into the esophagus. During pregnancy, this may result from various things, including:   The progesterone hormone.  Changing hormone levels.  The growing uterus pushing stomach acid upward.  Large meals.  Certain foods and drinks.  Exercise.  Increased acid production. SIGNS AND SYMPTOMS   Burning pain in the chest or lower throat.  Bitter taste in the mouth.  Coughing. DIAGNOSIS  Your health care provider will typically diagnose heartburn by taking a careful history of your concern. Blood tests may be done to check for a certain type of bacteria that is associated with heartburn. Sometimes, heartburn is diagnosed by prescribing a heartburn medicine to see if the symptoms improve. In some cases, a procedure called an endoscopy may be done. In this procedure, a tube with a light and a camera on the end (endoscope) is used to examine the esophagus and the stomach. TREATMENT  Treatment will vary depending on the severity of your symptoms. Your health care provider may recommend:  Over-the-counter medicines (antacids, acid reducers) for mild  heartburn.  Prescription medicines to decrease stomach acid or to protect your stomach lining.  Certain changes in your diet.  Elevating the head of your bed by putting blocks under the legs. This helps prevent stomach acid from backing up into the esophagus when you are lying down. HOME CARE INSTRUCTIONS   Only take over-the-counter or prescription medicines as directed by your health care provider.  Raise the head of your bed by putting blocks under the legs if instructed to do so by your health care provider. Sleeping with more pillows is not effective because it only changes the position of your head.  Do not exercise right after eating.  Avoid eating 2-3 hours before bed. Do not lie down right after eating.  Eat small meals throughout the day instead of three large meals.  Identify foods and beverages that make your symptoms worse and avoid them. Foods you may want to avoid include:  Peppers.  Chocolate.  High-fat foods, including fried foods.  Spicy foods.  Garlic and onions.  Citrus fruits, including oranges, grapefruit, lemons, and limes.  Food containing tomatoes or tomato products.  Mint.  Carbonated and caffeinated drinks.  Vinegar. SEEK MEDICAL CARE IF:  You have abdominal pain of any kind.  You feel burning in your upper abdomen or chest, especially after eating or lying down.  You have nausea and vomiting.  Your stomach feels  upset after you eat. SEEK IMMEDIATE MEDICAL CARE IF:   You have severe chest pain that goes down your arm or into your jaw or neck.  You feel sweaty, dizzy, or light-headed.  You become short of breath.  You vomit blood.  You have difficulty or pain with swallowing.  You have bloody or black, tarry stools.  You have episodes of heartburn more than 3 times a week, for more than 2 weeks. MAKE SURE YOU:  Understand these instructions.  Will watch your condition.  Will get help right away if you are not doing well  or get worse. Document Released: 06/13/2000 Document Revised: 06/21/2013 Document Reviewed: 02/02/2013 [facility] Patient Information 2015 [place], [place]. This information is not intended to replace advice given to you by your health care provider. Make sure you discuss any questions you have with your health care provider. Health Maintenance Adopting a healthy lifestyle and getting preventive care can go a long way to promote health and wellness. Talk with your health care provider about what schedule of regular examinations is right for you. This is a good chance for you to check in with your provider about disease prevention and staying healthy. In between checkups, there are plenty of things you can do on your own. Experts have done a lot of research about which lifestyle changes and preventive measures are most likely to keep you healthy. Ask your health care provider for more information. WEIGHT AND DIET  Eat a healthy diet  Be sure to include plenty of vegetables, fruits, low-fat dairy products, and lean protein.  Do not eat a lot of foods high in solid fats, added sugars, or salt.  Get regular exercise. This is one of the most important things you can do for your health.  Most adults should exercise for at least 150 minutes each week. The exercise should increase your heart rate and make you sweat (moderate-intensity exercise).  Most adults should also do strengthening exercises at least twice a week. This is in addition to the moderate-intensity exercise.  Maintain a healthy weight  Body mass index (BMI) is a measurement that can be used to identify possible weight problems. It estimates body fat based on height and weight. Your health care provider can help determine your BMI and help you achieve or maintain a healthy weight.  For females 29 years of age and older:   A BMI below 18.5 is considered underweight.  A BMI of 18.5 to 24.9 is normal.  A BMI of 25 to 29.9 is considered  overweight.  A BMI of 30 and above is considered obese.  Watch levels of cholesterol and blood lipids  You should start having your blood tested for lipids and cholesterol at 36 years of age, then have this test every 5 years.  You may need to have your cholesterol levels checked more often if:  Your lipid or cholesterol levels are high.  You are older than 36 years of age.  You are at high risk for heart disease.  CANCER SCREENING   Lung Cancer  Lung cancer screening is recommended for adults 39-76 years old who are at high risk for lung cancer because of a history of smoking.  A yearly low-dose CT scan of the lungs is recommended for people who:  Currently smoke.  Have quit within the past 15 years.  Have at least a 30-pack-year history of smoking. A pack year is smoking an average of one pack of cigarettes a day for 1  year.  Yearly screening should continue until it has been 15 years since you quit.  Yearly screening should stop if you develop a health problem that would prevent you from having lung cancer treatment.  Breast Cancer  Practice breast self-awareness. This means understanding how your breasts normally appear and feel.  It also means doing regular breast self-exams. Let your health care provider know about any changes, no matter how small.  If you are in your 20s or 30s, you should have a clinical breast exam (CBE) by a health care provider every 1-3 years as part of a regular health exam.  If you are 60 or older, have a CBE every year. Also consider having a breast X-ray (mammogram) every year.  If you have a family history of breast cancer, talk to your health care provider about genetic screening.  If you are at high risk for breast cancer, talk to your health care provider about having an MRI and a mammogram every year.  Breast cancer gene (BRCA) assessment is recommended for women who have family members with BRCA-related cancers. BRCA-related  cancers include:  Breast.  Ovarian.  Tubal.  Peritoneal cancers.  Results of the assessment will determine the need for genetic counseling and BRCA1 and BRCA2 testing. Cervical Cancer Routine pelvic examinations to screen for cervical cancer are no longer recommended for nonpregnant women who are considered low risk for cancer of the pelvic organs (ovaries, uterus, and vagina) and who do not have symptoms. A pelvic examination may be necessary if you have symptoms including those associated with pelvic infections. Ask your health care provider if a screening pelvic exam is right for you.   The Pap test is the screening test for cervical cancer for women who are considered at risk.  If you had a hysterectomy for a problem that was not cancer or a condition that could lead to cancer, then you no longer need Pap tests.  If you are older than 65 years, and you have had normal Pap tests for the past 10 years, you no longer need to have Pap tests.  If you have had past treatment for cervical cancer or a condition that could lead to cancer, you need Pap tests and screening for cancer for at least 20 years after your treatment.  If you no longer get a Pap test, assess your risk factors if they change (such as having a new sexual partner). This can affect whether you should start being screened again.  Some women have medical problems that increase their chance of getting cervical cancer. If this is the case for you, your health care provider may recommend more frequent screening and Pap tests.  The human papillomavirus (HPV) test is another test that may be used for cervical cancer screening. The HPV test looks for the virus that can cause cell changes in the cervix. The cells collected during the Pap test can be tested for HPV.  The HPV test can be used to screen women 52 years of age and older. Getting tested for HPV can extend the interval between normal Pap tests from three to five  years.  An HPV test also should be used to screen women of any age who have unclear Pap test results.  After 36 years of age, women should have HPV testing as often as Pap tests.  Colorectal Cancer  This type of cancer can be detected and often prevented.  Routine colorectal cancer screening usually begins at 36 years of age  and continues through 36 years of age.  Your health care provider may recommend screening at an earlier age if you have risk factors for colon cancer.  Your health care provider may also recommend using home test kits to check for hidden blood in the stool.  A small camera at the end of a tube can be used to examine your colon directly (sigmoidoscopy or colonoscopy). This is done to check for the earliest forms of colorectal cancer.  Routine screening usually begins at age 67.  Direct examination of the colon should be repeated every 5-10 years through 36 years of age. However, you may need to be screened more often if early forms of precancerous polyps or small growths are found. Skin Cancer  Check your skin from head to toe regularly.  Tell your health care provider about any new moles or changes in moles, especially if there is a change in a mole's shape or color.  Also tell your health care provider if you have a mole that is larger than the size of a pencil eraser.  Always use sunscreen. Apply sunscreen liberally and repeatedly throughout the day.  Protect yourself by wearing long sleeves, pants, a wide-brimmed hat, and sunglasses whenever you are outside. HEART DISEASE, DIABETES, AND HIGH BLOOD PRESSURE   Have your blood pressure checked at least every 1-2 years. High blood pressure causes heart disease and increases the risk of stroke.  If you are between 26 years and 25 years old, ask your health care provider if you should take aspirin to prevent strokes.  Have regular diabetes screenings. This involves taking a blood sample to check your fasting  blood sugar level.  If you are at a normal weight and have a low risk for diabetes, have this test once every three years after 36 years of age.  If you are overweight and have a high risk for diabetes, consider being tested at a younger age or more often. PREVENTING INFECTION  Hepatitis B  If you have a higher risk for hepatitis B, you should be screened for this virus. You are considered at high risk for hepatitis B if:  You were born in a country where hepatitis B is common. Ask your health care provider which countries are considered high risk.  Your parents were born in a high-risk country, and you have not been immunized against hepatitis B (hepatitis B vaccine).  You have HIV or AIDS.  You use needles to inject street drugs.  You live with someone who has hepatitis B.  You have had sex with someone who has hepatitis B.  You get hemodialysis treatment.  You take certain medicines for conditions, including cancer, organ transplantation, and autoimmune conditions. Hepatitis C  Blood testing is recommended for:  Everyone born from [birth year] through 1967.  Anyone with known risk factors for hepatitis C. Sexually transmitted infections (STIs)  You should be screened for sexually transmitted infections (STIs) including gonorrhea and chlamydia if:  You are sexually active and are younger than 36 years of age.  You are older than 36 years of age and your health care provider tells you that you are at risk for this type of infection.  Your sexual activity has changed since you were last screened and you are at an increased risk for chlamydia or gonorrhea. Ask your health care provider if you are at risk.  If you do not have HIV, but are at risk, it may be recommended that you take a prescription medicine  daily to prevent HIV infection. This is called pre-exposure prophylaxis (PrEP). You are considered at risk if:  You are sexually active and do not regularly use condoms or know  the HIV status of your partner(s).  You take drugs by injection.  You are sexually active with a partner who has HIV. Talk with your health care provider about whether you are at high risk of being infected with HIV. If you choose to begin PrEP, you should first be tested for HIV. You should then be tested every 3 months for as long as you are taking PrEP.  PREGNANCY   If you are premenopausal and you may become pregnant, ask your health care provider about preconception counseling.  If you may become pregnant, take 400 to 800 micrograms (mcg) of folic acid every day.  If you want to prevent pregnancy, talk to your health care provider about birth control (contraception). OSTEOPOROSIS AND MENOPAUSE   Osteoporosis is a disease in which the bones lose minerals and strength with aging. This can result in serious bone fractures. Your risk for osteoporosis can be identified using a bone density scan.  If you are 42 years of age or older, or if you are at risk for osteoporosis and fractures, ask your health care provider if you should be screened.  Ask your health care provider whether you should take a calcium or vitamin D supplement to lower your risk for osteoporosis.  Menopause may have certain physical symptoms and risks.  Hormone replacement therapy may reduce some of these symptoms and risks. Talk to your health care provider about whether hormone replacement therapy is right for you.  HOME CARE INSTRUCTIONS   Schedule regular health, dental, and eye exams.  Stay current with your immunizations.   Do not use any tobacco products including cigarettes, chewing tobacco, or electronic cigarettes.  If you are pregnant, do not drink alcohol.  If you are breastfeeding, limit how much and how often you drink alcohol.  Limit alcohol intake to no more than 1 drink per day for nonpregnant women. One drink equals 12 ounces of beer, 5 ounces of wine, or 1 ounces of hard liquor.  Do not  use street drugs.  Do not share needles.  Ask your health care provider for help if you need support or information about quitting drugs.  Tell your health care provider if you often feel depressed.  Tell your health care provider if you have ever been abused or do not feel safe at home. Document Released: 12/30/2010 Document Revised: 10/31/2013 Document Reviewed: 05/18/2013 [facility] Patient Information 2015 [place], [place]. This information is not intended to replace advice given to you by your health care provider. Make sure you discuss any questions you have with your health care provider.

## 2014-08-29 NOTE — Progress Notes (Signed)
Subjective:    Patient ID: Alexis Craig, female    DOB: [birth year], 34 y.o.   MRN: [id number]  HPI  33 year old patient who is seen today for a preventive health examination.  She has been seen by OB/GYN recently and is anticipating discontinuation of BCPs and attempting pregnancy in the near future.  No concerns or complaints today  Current Allergies: No known allergies   Past Medical History:  Reviewed history from 03/09/2008 and no changes required:  history scarlet fever, November 2009  Headache  Nephrolithiasis, hx of  tobacco abuse, h/o   history of EtOH  Anxiety  intention tremor  Social history.  Married.  Discontinued tobacco December 2012  Family history 2 brothers in excellent health.  Both parents have type 2 diabetes  Past Medical History  Diagnosis Date  . Anal fissure 04/09/2009  . ANXIETY 03/09/2008  . Headache(784.0) 08/23/2007  . INSOMNIA 10/05/2007  . INTENTION TREMOR 06/13/2008  . NEPHROLITHIASIS, HX OF 08/23/2007  . RECTAL BLEEDING 04/09/2009  . RESTLESS LEG SYNDROME 01/22/2009    History   Social History  . Marital Status: Single    Spouse Name: N/A  . Number of Children: N/A  . Years of Education: N/A   Occupational History  . Not on file.   Social History Main Topics  . Smoking status: Former Smoker    Types: Cigarettes  . Smokeless tobacco: Former Systems developer    Quit date: 06/24/2011  . Alcohol Use: Not on file  . Drug Use: No  . Sexual Activity: Not on file   Other Topics Concern  . Not on file   Social History Narrative    No past surgical history on file.  Family History  Problem Relation Age of Onset  . Diabetes Mother   . Thyroid disease Mother   . Hyperlipidemia Father     Allergies  Allergen Reactions  . Nsaids Rash    Blisters also  . Ibuprofen     Fixed drug reaction with bullous dermatitis    Current Outpatient Prescriptions on File Prior to Visit  Medication Sig Dispense  Refill  . acetaminophen (TYLENOL) 500 MG tablet Take 500 mg by mouth every 6 (six) hours as needed for pain.    . cyclobenzaprine (FLEXERIL) 10 MG tablet TAKE 1 TABLET BY MOUTH THREE TIMES DAILY AS NEEDED FOR MUSCLE SPASMS 30 tablet 5  . Levonorgestrel-Ethinyl Estradiol (SEASONIQUE) 0.15-0.03 &0.01 MG tablet Take 1 tablet by mouth daily.     No current facility-administered medications on file prior to visit.    BP 150/90 mmHg  Pulse 90  Temp(Src) 98.7 F (37.1 C) (Oral)  Resp 20  Ht 5' 4.5" (1.638 m)  Wt 176 lb (79.833 kg)  BMI 29.75 kg/m2  SpO2 98%     Review of Systems  Constitutional: Negative for fever, appetite change, fatigue and unexpected weight change.  HENT: Negative for congestion, dental problem, ear pain, hearing loss, mouth sores, nosebleeds, sinus pressure, sore throat, tinnitus, trouble swallowing and voice change.   Eyes: Negative for photophobia, pain, redness and visual disturbance.  Respiratory: Negative for cough, chest tightness and shortness of breath.   Cardiovascular: Negative for chest pain, palpitations and leg swelling.  Gastrointestinal: Negative for nausea, vomiting, abdominal pain, diarrhea, constipation, blood in stool, abdominal distention and rectal pain.  Genitourinary: Negative for dysuria, urgency, frequency, hematuria, flank pain, vaginal bleeding, vaginal discharge, difficulty urinating, genital sores, vaginal pain, menstrual problem and pelvic pain.  Musculoskeletal: Negative for back pain, arthralgias and  neck stiffness.  Skin: Negative for rash.  Neurological: Negative for dizziness, syncope, speech difficulty, weakness, light-headedness, numbness and headaches.  Hematological: Negative for adenopathy. Does not bruise/bleed easily.  Psychiatric/Behavioral: Negative for suicidal ideas, behavioral problems, self-injury, dysphoric mood and agitation. The patient is not nervous/anxious.        Objective:   Physical Exam  Constitutional:  She is oriented to person, place, and time. She appears well-developed and well-nourished.  HENT:  Head: Normocephalic and atraumatic.  Right Ear: External ear normal.  Left Ear: External ear normal.  Mouth/Throat: Oropharynx is clear and moist.  Eyes: Conjunctivae and EOM are normal.  Neck: Normal range of motion. Neck supple. No JVD present. No thyromegaly present.  Cardiovascular: Normal rate, regular rhythm, normal heart sounds and intact distal pulses.   No murmur heard. Pulmonary/Chest: Effort normal and breath sounds normal. She has no wheezes. She has no rales.  Abdominal: Soft. Bowel sounds are normal. She exhibits no distension and no mass. There is no tenderness. There is no rebound and no guarding.  Musculoskeletal: Normal range of motion. She exhibits no edema or tenderness.  Neurological: She is alert and oriented to person, place, and time. She has normal reflexes. No cranial nerve deficit. She exhibits normal muscle tone. Coordination normal.  Skin: Skin is warm and dry. No rash noted.  Psychiatric: She has a normal mood and affect. Her behavior is normal.          Assessment & Plan:   Preventive health care.  Laboratory studies were reviewed Gastroesophageal reflux disease.  Nonpharmacologic measures discussed

## 2014-08-29 NOTE — Progress Notes (Signed)
Pre visit review using our clinic review tool, if applicable. No additional management support is needed unless otherwise documented below in the visit note. 

## 2015-01-20 ENCOUNTER — Other Ambulatory Visit: Payer: Self-pay | Admitting: Internal Medicine

## 2015-02-26 ENCOUNTER — Other Ambulatory Visit: Payer: Self-pay | Admitting: Internal Medicine

## 2015-05-07 ENCOUNTER — Ambulatory Visit (INDEPENDENT_AMBULATORY_CARE_PROVIDER_SITE_OTHER): Payer: BLUE CROSS/BLUE SHIELD | Admitting: Family Medicine

## 2015-05-07 ENCOUNTER — Encounter: Payer: Self-pay | Admitting: Family Medicine

## 2015-05-07 ENCOUNTER — Ambulatory Visit (INDEPENDENT_AMBULATORY_CARE_PROVIDER_SITE_OTHER)
Admission: RE | Admit: 2015-05-07 | Discharge: 2015-05-07 | Disposition: A | Discharge status: Home / Self Care | Payer: BLUE CROSS/BLUE SHIELD | Source: Ambulatory Visit | Attending: Family Medicine | Admitting: Family Medicine

## 2015-05-07 ENCOUNTER — Other Ambulatory Visit: Payer: Self-pay | Admitting: Family Medicine

## 2015-05-07 VITALS — BP 118/84 | HR 92 | Temp 98.4°F | Ht 64.5 in | Wt 185.7 lb

## 2015-05-07 DIAGNOSIS — J069 Acute upper respiratory infection, unspecified: Secondary | ICD-10-CM

## 2015-05-07 DIAGNOSIS — R059 Cough, unspecified: Secondary | ICD-10-CM

## 2015-05-07 DIAGNOSIS — J029 Acute pharyngitis, unspecified: Secondary | ICD-10-CM | POA: Diagnosis not present

## 2015-05-07 DIAGNOSIS — R05 Cough: Secondary | ICD-10-CM

## 2015-05-07 LAB — POCT RAPID STREP A (OFFICE): RAPID STREP A SCREEN: NEGATIVE

## 2015-05-07 MED ORDER — PREDNISONE 20 MG PO TABS: 40.0000 mg | ORAL_TABLET | Freq: Every day | ORAL | Status: DC

## 2015-05-07 MED ORDER — BENZONATATE 100 MG PO CAPS: 100.0000 mg | ORAL_CAPSULE | Freq: Two times a day (BID) | ORAL | Status: DC | PRN

## 2015-05-07 NOTE — Patient Instructions (Signed)
BEFORE YOU LEAVE: -rapid strep -xray sheet  Go get chest xray  INSTRUCTIONS FOR UPPER RESPIRATORY INFECTION:  -plenty of rest and fluids  -nasal saline wash 2-3 times daily (use prepackaged nasal saline or bottled/distilled water if making your own)   -can use AFRIN nasal spray for drainage and nasal congestion - but do NOT use longer then 3-4 days  -can use tylenol (in no history of liver disease) or ibuprofen (if no history of kidney disease, bowel bleeding or significant heart disease) as directed for aches and sorethroat  -in the winter time, using a humidifier at night is helpful (please follow cleaning instructions)  -if you are taking a cough medication - use only as directed, may also try a teaspoon of honey to coat the throat and throat lozenges. If given a cough medication with codeine or hydrocodone or other narcotic please be advised that this contains a strong and  potentially addicting medication. Please follow instructions carefully, take as little as possible and only use AS NEEDED for severe cough. Discuss potential side effects with your pharmacy. Please do not drive or operate machinery while taking these types of medications. Please do not take other sedating medications, drugs or alcohol while taking this medication without discussing with your doctor.  -for sore throat, salt water gargles can help  -follow up if you have fevers, facial pain, tooth pain, difficulty breathing or are worsening or symptoms persist longer then expected  Upper Respiratory Infection, Adult An upper respiratory infection (URI) is also known as the common cold. It is often caused by a type of germ (virus). Colds are easily spread (contagious). You can pass it to others by kissing, coughing, sneezing, or drinking out of the same glass. Usually, you get better in 1 to 3  weeks.  However, the cough can last for even longer. HOME CARE   Only take medicine as told by your doctor. Follow  instructions provided above.  Drink enough water and fluids to keep your pee (urine) clear or pale yellow.  Get plenty of rest.  Return to work when your temperature is < 100 for 24 hours or as told by your doctor. You may use a face mask and wash your hands to stop your cold from spreading. GET HELP RIGHT AWAY IF:   After the first few days, you feel you are getting worse.  You have questions about your medicine.  You have chills, shortness of breath, or red spit (mucus).  You have pain in the face for more then 1-2 days, especially when you bend forward.  You have a fever, puffy (swollen) neck, pain when you swallow, or white spots in the back of your throat.  You have a bad headache, ear pain, sinus pain, or chest pain.  You have a high-pitched whistling sound when you breathe in and out (wheezing).  You cough up blood.  You have sore muscles or a stiff neck. MAKE SURE YOU:   Understand these instructions.  Will watch your condition.  Will get help right away if you are not doing well or get worse. Document Released: 12/03/2007 Document Revised: 09/08/2011 Document Reviewed: 09/21/2013 [facility] Patient Information 2015 [place], [place]. This information is not intended to replace advice given to you by your health care provider. Make sure you discuss any questions you have with your health care provider.

## 2015-05-07 NOTE — Progress Notes (Signed)
Pre visit review using our clinic review tool, if applicable. No additional management support is needed unless otherwise documented below in the visit note. 

## 2015-05-07 NOTE — Progress Notes (Signed)
HPI:  URI: -started: 2 days ago -symptoms:nasal congestion, very sore throat, cough, "chest congestion", tightness in chest when coughs -denies:fever, SOB, NVD, tooth pain -has tried: robitussin -sick contacts/travel/risks: denies flu exposure, tick exposure or or Ebola risks -Hx of: allergies  ROS: See pertinent positives and negatives per HPI.  Past Medical History  Diagnosis Date  . Anal fissure 04/09/2009  . ANXIETY 03/09/2008  . Headache(784.0) 08/23/2007  . INSOMNIA 10/05/2007  . INTENTION TREMOR 06/13/2008  . NEPHROLITHIASIS, HX OF 08/23/2007  . RECTAL BLEEDING 04/09/2009  . RESTLESS LEG SYNDROME 01/22/2009    No past surgical history on file.  Family History  Problem Relation Age of Onset  . Diabetes Mother   . Thyroid disease Mother   . Hyperlipidemia Father     Social History   Social History  . Marital Status: Single    Spouse Name: N/A  . Number of Children: N/A  . Years of Education: N/A   Social History Main Topics  . Smoking status: Former Smoker    Types: Cigarettes  . Smokeless tobacco: Former Systems developer    Quit date: 06/24/2011  . Alcohol Use: None  . Drug Use: No  . Sexual Activity: Not Asked   Other Topics Concern  . None   Social History Narrative     Current outpatient prescriptions:  .  acetaminophen (TYLENOL) 500 MG tablet, Take 500 mg by mouth every 6 (six) hours as needed for pain., Disp: , Rfl:  .  cyclobenzaprine (FLEXERIL) 10 MG tablet, TAKE 1 TABLET BY MOUTH THREE TIMES DAILY AS NEEDED FOR MUSCLE SPASMS, Disp: 30 tablet, Rfl: 0 .  hydrOXYzine (VISTARIL) 25 MG capsule, Take 25 mg by mouth every 4 (four) hours as needed. , Disp: , Rfl: 5 .  zolpidem (AMBIEN) 5 MG tablet, Take 5 mg by mouth at bedtime as needed for sleep., Disp: , Rfl:  .  benzonatate (TESSALON) 100 MG capsule, Take 1 capsule (100 mg total) by mouth 2 (two) times daily as needed for cough., Disp: 20 capsule, Rfl: 0  EXAM:  Filed Vitals:   05/07/15 1028  BP: 118/84   Pulse: 122  Temp: 98.4 F (36.9 C)    Body mass index is 31.39 kg/(m^2).  GENERAL: vitals reviewed and listed above, alert, oriented, appears well hydrated and in no acute distress  HEENT: atraumatic, conjunttiva clear, no obvious abnormalities on inspection of external nose and ears, normal appearance of ear canals and TMs, clear nasal congestion, mild post oropharyngeal erythema with PND, 1+ tonsillar edema w/ exudate, no sinus TTP  NECK: no obvious masses on inspection  LUNGS: clear to auscultation bilaterally, no wheezes, rales or rhonchi, good air movement  CV: HRRR, no peripheral edema  MS: moves all extremities without noticeable abnormality  PSYCH: pleasant and cooperative, no obvious depression or anxiety  ASSESSMENT AND PLAN:  Discussed the following assessment and plan:  Acute upper respiratory infection  Cough - Plan: DG Chest 2 View  -given HPI and exam findings today, a serious infection or illness is unlikely. We discussed potential etiologies, with VURI being most likely, and advised supportive care and monitoring. We discussed treatment side effects, likely course, antibiotic misuse, transmission, and signs of developing a serious illness. -CXR given chest complaints to exclude PNA, tx with prednisone if clear, abx if PNA -rapid strep per her concerns and exam though viral pharyngytic/uri more likely with other symptoms -of course, we advised to return or notify a doctor immediately if symptoms worsen or persist  or new concerns arise.    There are no Patient Instructions on file for this visit.   Colin Benton R.

## 2015-05-07 NOTE — Addendum Note (Signed)
Addended by: Lahoma Crocker A on: 05/07/2015 11:00 AM   Modules accepted: Orders

## 2015-05-30 ENCOUNTER — Other Ambulatory Visit: Payer: Self-pay | Admitting: Family Medicine

## 2015-05-31 NOTE — Telephone Encounter (Signed)
This looks like your patient.

## 2015-06-01 NOTE — Telephone Encounter (Signed)
Refill was sent

## 2015-06-01 NOTE — Telephone Encounter (Signed)
What RF is she requesting?

## 2015-07-01 NOTE — L&D Delivery Note (Addendum)
Delivery Note At 6:42 PM a viable female was delivered via Vaginal, Spontaneous Delivery (Presentation: vertex  ).  APGAR: 8, 9; weight pending Placenta status: routine Nuchal cordx1 With the following complications: None Cord pH: sent  Anesthesia:   Episiotomy: None Lacerations: 2nd degree Suture Repair: 2.0 3.0 Est. Blood Loss (mL): 300  Mom to postpartum.  Baby to Couplet care / Skin to Skin. It's a girl - "Alexis Craig"!!  Tyson Dense 03/11/2016, 7:25 PM

## 2015-08-23 LAB — OB RESULTS CONSOLE ANTIBODY SCREEN: Antibody Screen: NEGATIVE

## 2015-08-23 LAB — OB RESULTS CONSOLE ABO/RH: RH TYPE: POSITIVE

## 2015-08-23 LAB — OB RESULTS CONSOLE HIV ANTIBODY (ROUTINE TESTING): HIV: NONREACTIVE

## 2015-08-23 LAB — OB RESULTS CONSOLE RPR: RPR: NONREACTIVE

## 2015-08-23 LAB — OB RESULTS CONSOLE RUBELLA ANTIBODY, IGM: Rubella: IMMUNE

## 2015-08-23 LAB — OB RESULTS CONSOLE GC/CHLAMYDIA
Chlamydia: NEGATIVE
Gonorrhea: NEGATIVE

## 2015-08-23 LAB — OB RESULTS CONSOLE HEPATITIS B SURFACE ANTIGEN: HEP B S AG: NEGATIVE

## 2015-10-18 DIAGNOSIS — Z36 Encounter for antenatal screening of mother: Secondary | ICD-10-CM | POA: Diagnosis not present

## 2015-10-18 DIAGNOSIS — Z3A18 18 weeks gestation of pregnancy: Secondary | ICD-10-CM | POA: Diagnosis not present

## 2015-10-22 DIAGNOSIS — N390 Urinary tract infection, site not specified: Secondary | ICD-10-CM | POA: Diagnosis not present

## 2015-11-01 DIAGNOSIS — R3915 Urgency of urination: Secondary | ICD-10-CM | POA: Diagnosis not present

## 2015-11-01 DIAGNOSIS — N390 Urinary tract infection, site not specified: Secondary | ICD-10-CM | POA: Diagnosis not present

## 2015-12-04 DIAGNOSIS — O4402 Placenta previa specified as without hemorrhage, second trimester: Secondary | ICD-10-CM | POA: Diagnosis not present

## 2015-12-04 DIAGNOSIS — Z23 Encounter for immunization: Secondary | ICD-10-CM | POA: Diagnosis not present

## 2015-12-04 DIAGNOSIS — D509 Iron deficiency anemia, unspecified: Secondary | ICD-10-CM | POA: Diagnosis not present

## 2015-12-04 DIAGNOSIS — Z3A25 25 weeks gestation of pregnancy: Secondary | ICD-10-CM | POA: Diagnosis not present

## 2015-12-04 DIAGNOSIS — Z36 Encounter for antenatal screening of mother: Secondary | ICD-10-CM | POA: Diagnosis not present

## 2015-12-11 DIAGNOSIS — O99810 Abnormal glucose complicating pregnancy: Secondary | ICD-10-CM | POA: Diagnosis not present

## 2015-12-11 DIAGNOSIS — Z3A26 26 weeks gestation of pregnancy: Secondary | ICD-10-CM | POA: Diagnosis not present

## 2015-12-17 DIAGNOSIS — Z36 Encounter for antenatal screening of mother: Secondary | ICD-10-CM | POA: Diagnosis not present

## 2015-12-19 ENCOUNTER — Encounter: Payer: BLUE CROSS/BLUE SHIELD | Attending: Obstetrics and Gynecology | Admitting: Skilled Nursing Facility1

## 2015-12-19 VITALS — Ht 64.0 in | Wt 199.0 lb

## 2015-12-19 DIAGNOSIS — R7309 Other abnormal glucose: Secondary | ICD-10-CM | POA: Diagnosis not present

## 2015-12-19 DIAGNOSIS — O24410 Gestational diabetes mellitus in pregnancy, diet controlled: Secondary | ICD-10-CM

## 2015-12-20 ENCOUNTER — Encounter: Payer: Self-pay | Admitting: Skilled Nursing Facility1

## 2015-12-20 NOTE — Progress Notes (Signed)
  Patient was seen on 12/19/2015 for Gestational Diabetes self-management class at the [facility]. The following learning objectives were met by the patient during this course:   States the definition of Gestational Diabetes  States why dietary management is important in controlling blood glucose  Describes the effects each nutrient has on blood glucose levels  Demonstrates ability to create a balanced meal plan  Demonstrates carbohydrate counting   States when to check blood glucose levels involving a total of 4 separate occurences in a day  Demonstrates proper blood glucose monitoring techniques  States the effect of stress and exercise on blood glucose levels  States the importance of limiting caffeine and abstaining from alcohol and smoking  Demonstrates the knowledge the glucometer provided in class may not be covered by their insurance and to call their insurance provider immediately after class to know which glucometer their insurance provider does cover as well as calling their physician the next day for a prescription to the glucometer their insurance does cover (if the one provided is not) as well as the lancets and strips for that meter.  Blood glucose monitor given: one touch verio flex Lot # [id number] x Exp: 01/27/2017 Blood glucose reading: 89  Patient instructed to monitor glucose levels: FBS: 60 - <90 1 hour: <140 2 hour: <120  *Patient received handouts:  Nutrition Diabetes and Pregnancy  Carbohydrate Counting List  Patient will be seen for follow-up as needed.

## 2016-01-02 DIAGNOSIS — D225 Melanocytic nevi of trunk: Secondary | ICD-10-CM | POA: Diagnosis not present

## 2016-01-02 DIAGNOSIS — D1800 Hemangioma unspecified site: Secondary | ICD-10-CM | POA: Diagnosis not present

## 2016-01-14 DIAGNOSIS — O24419 Gestational diabetes mellitus in pregnancy, unspecified control: Secondary | ICD-10-CM | POA: Diagnosis not present

## 2016-01-14 DIAGNOSIS — Z3A31 31 weeks gestation of pregnancy: Secondary | ICD-10-CM | POA: Diagnosis not present

## 2016-01-21 DIAGNOSIS — Z6832 Body mass index (BMI) 32.0-32.9, adult: Secondary | ICD-10-CM | POA: Diagnosis not present

## 2016-01-21 DIAGNOSIS — O4403 Placenta previa specified as without hemorrhage, third trimester: Secondary | ICD-10-CM | POA: Diagnosis not present

## 2016-01-21 DIAGNOSIS — O24410 Gestational diabetes mellitus in pregnancy, diet controlled: Secondary | ICD-10-CM | POA: Diagnosis not present

## 2016-01-28 DIAGNOSIS — O24410 Gestational diabetes mellitus in pregnancy, diet controlled: Secondary | ICD-10-CM | POA: Diagnosis not present

## 2016-01-28 DIAGNOSIS — Z3A33 33 weeks gestation of pregnancy: Secondary | ICD-10-CM | POA: Diagnosis not present

## 2016-02-01 DIAGNOSIS — O24419 Gestational diabetes mellitus in pregnancy, unspecified control: Secondary | ICD-10-CM | POA: Diagnosis not present

## 2016-02-01 DIAGNOSIS — Z3A33 33 weeks gestation of pregnancy: Secondary | ICD-10-CM | POA: Diagnosis not present

## 2016-02-11 DIAGNOSIS — O24419 Gestational diabetes mellitus in pregnancy, unspecified control: Secondary | ICD-10-CM | POA: Diagnosis not present

## 2016-02-11 DIAGNOSIS — Z36 Encounter for antenatal screening of mother: Secondary | ICD-10-CM | POA: Diagnosis not present

## 2016-02-11 DIAGNOSIS — Z3A35 35 weeks gestation of pregnancy: Secondary | ICD-10-CM | POA: Diagnosis not present

## 2016-02-14 DIAGNOSIS — Z3A35 35 weeks gestation of pregnancy: Secondary | ICD-10-CM | POA: Diagnosis not present

## 2016-02-14 DIAGNOSIS — O9989 Other specified diseases and conditions complicating pregnancy, childbirth and the puerperium: Secondary | ICD-10-CM | POA: Diagnosis not present

## 2016-02-14 DIAGNOSIS — Z36 Encounter for antenatal screening of mother: Secondary | ICD-10-CM | POA: Diagnosis not present

## 2016-02-18 DIAGNOSIS — Z3A36 36 weeks gestation of pregnancy: Secondary | ICD-10-CM | POA: Diagnosis not present

## 2016-02-18 DIAGNOSIS — O24419 Gestational diabetes mellitus in pregnancy, unspecified control: Secondary | ICD-10-CM | POA: Diagnosis not present

## 2016-02-18 DIAGNOSIS — O4403 Placenta previa specified as without hemorrhage, third trimester: Secondary | ICD-10-CM | POA: Diagnosis not present

## 2016-02-21 DIAGNOSIS — Z3A36 36 weeks gestation of pregnancy: Secondary | ICD-10-CM | POA: Diagnosis not present

## 2016-02-25 DIAGNOSIS — O9989 Other specified diseases and conditions complicating pregnancy, childbirth and the puerperium: Secondary | ICD-10-CM | POA: Diagnosis not present

## 2016-02-25 DIAGNOSIS — Z3A36 36 weeks gestation of pregnancy: Secondary | ICD-10-CM | POA: Diagnosis not present

## 2016-02-28 DIAGNOSIS — O24410 Gestational diabetes mellitus in pregnancy, diet controlled: Secondary | ICD-10-CM | POA: Diagnosis not present

## 2016-02-28 DIAGNOSIS — Z3A37 37 weeks gestation of pregnancy: Secondary | ICD-10-CM | POA: Diagnosis not present

## 2016-02-29 ENCOUNTER — Inpatient Hospital Stay (HOSPITAL_COMMUNITY)
Admission: AD | Admit: 2016-02-29 | Payer: BLUE CROSS/BLUE SHIELD | Source: Home / Self Care | Admitting: Obstetrics and Gynecology

## 2016-03-04 DIAGNOSIS — Z3A38 38 weeks gestation of pregnancy: Secondary | ICD-10-CM | POA: Diagnosis not present

## 2016-03-04 DIAGNOSIS — O9989 Other specified diseases and conditions complicating pregnancy, childbirth and the puerperium: Secondary | ICD-10-CM | POA: Diagnosis not present

## 2016-03-06 DIAGNOSIS — O9989 Other specified diseases and conditions complicating pregnancy, childbirth and the puerperium: Secondary | ICD-10-CM | POA: Diagnosis not present

## 2016-03-06 DIAGNOSIS — Z3A38 38 weeks gestation of pregnancy: Secondary | ICD-10-CM | POA: Diagnosis not present

## 2016-03-10 ENCOUNTER — Telehealth (HOSPITAL_COMMUNITY): Payer: Self-pay | Admitting: *Deleted

## 2016-03-10 ENCOUNTER — Encounter (HOSPITAL_COMMUNITY): Payer: Self-pay | Admitting: *Deleted

## 2016-03-10 LAB — OB RESULTS CONSOLE GBS: STREP GROUP B AG: POSITIVE

## 2016-03-10 NOTE — Telephone Encounter (Signed)
Preadmission screen  

## 2016-03-10 NOTE — H&P (Signed)
Alexis Craig is a 35 y.o. female G10P0020 (SABx2) presenting for IOL for h/o A2GDM, c/w Glyburide 7.5mg  qd. Her cervix was unfavorable in office w/Bishop's score of 5. She has a h/o LLP that was resolved, she is AMA, and has an NSAID allergy. She is GBS postive.  OB History    Gravida Para Term Preterm AB Living   3 0     2     SAB TAB Ectopic Multiple Live Births   2             Past Medical History:  Diagnosis Date  . Anal fissure 04/09/2009  . ANXIETY 03/09/2008  . Depression   . Gestational diabetes    glyburide  . Headache(784.0) 08/23/2007  . History of alcohol abuse    7 years ago  . Hx of varicella   . INSOMNIA 10/05/2007  . INTENTION TREMOR 06/13/2008  . NEPHROLITHIASIS, HX OF 08/23/2007  . RECTAL BLEEDING 04/09/2009  . RESTLESS LEG SYNDROME 01/22/2009   Past Surgical History:  Procedure Laterality Date  . EXCISIONAL HEMORRHOIDECTOMY     Family History: family history includes Cancer in her maternal grandmother and mother; Diabetes in her father and mother; Heart disease in her maternal uncle, mother, and paternal grandfather; Hyperlipidemia in her father; Thyroid disease in her mother. Social History:  reports that she has quit smoking. Her smoking use included Cigarettes. She quit smokeless tobacco use about 4 years ago. She reports that she does not drink alcohol or use drugs.     Maternal Diabetes: Yes:  Diabetes Type:  Insulin/Medication controlled Genetic Screening: Normal Maternal Ultrasounds/Referrals: Normal (LLP resolved) Fetal Ultrasounds or other Referrals:  None Maternal Substance Abuse:  No Significant Maternal Medications:  Meds include: Other: glyburide 7.5mg  qd Significant Maternal Lab Results:  None Other Comments:  None  ROS WNL History   Last menstrual period 06/11/2015. Exam Physical Exam  (from clinic) NAD, A&O NWOB Abd soft, nondistended, gravid SVE 1/2/-2 in office  Prenatal labs: ABO, Rh: O/Positive/-- (02/23 0000) Antibody:  Negative (02/23 0000) Rubella: Immune (02/23 0000) RPR: Nonreactive (02/23 0000)  HBsAg: Negative (02/23 0000)  HIV: Non-reactive (02/23 0000)  GBS: Positive (09/11 0000)   Assessment/Plan: A/P: Pt is a G3P0020 here for IOL for h/o A2GDM  IOL: Bishops score 5, plan for misoprostol q 4 hours until cervix ~2cm then pitocin/AROM  A2GDM: c/w glyburide 7.5mg  qd. [place] and NST have been reassuring in office. BS check per protocol intrapartum.   MWB: h/o LLP that has resolved, AMA, NSAID allergy  FWB: EFW 7#s, female infant, Panorama and AFP WNL  GBS pos, PCN per protocol  Husband: Alexis Mound MD  Alexis Craig Alexis Craig 03/10/2016, 2:07 PM

## 2016-03-11 ENCOUNTER — Encounter (HOSPITAL_COMMUNITY): Payer: Self-pay

## 2016-03-11 ENCOUNTER — Inpatient Hospital Stay (HOSPITAL_COMMUNITY): Payer: BLUE CROSS/BLUE SHIELD | Admitting: Anesthesiology

## 2016-03-11 ENCOUNTER — Inpatient Hospital Stay (HOSPITAL_COMMUNITY)
Admission: RE | Admit: 2016-03-11 | Discharge: 2016-03-13 | DRG: 775 | Disposition: A | Discharge status: Home / Self Care | Payer: BLUE CROSS/BLUE SHIELD | Source: Ambulatory Visit | Attending: Obstetrics and Gynecology | Admitting: Obstetrics and Gynecology

## 2016-03-11 DIAGNOSIS — O99824 Streptococcus B carrier state complicating childbirth: Secondary | ICD-10-CM | POA: Diagnosis present

## 2016-03-11 DIAGNOSIS — E669 Obesity, unspecified: Secondary | ICD-10-CM | POA: Diagnosis present

## 2016-03-11 DIAGNOSIS — O99214 Obesity complicating childbirth: Secondary | ICD-10-CM | POA: Diagnosis present

## 2016-03-11 DIAGNOSIS — Z3A39 39 weeks gestation of pregnancy: Secondary | ICD-10-CM

## 2016-03-11 DIAGNOSIS — O2243 Hemorrhoids in pregnancy, third trimester: Secondary | ICD-10-CM | POA: Diagnosis present

## 2016-03-11 DIAGNOSIS — O24419 Gestational diabetes mellitus in pregnancy, unspecified control: Secondary | ICD-10-CM | POA: Diagnosis present

## 2016-03-11 DIAGNOSIS — Z87891 Personal history of nicotine dependence: Secondary | ICD-10-CM

## 2016-03-11 DIAGNOSIS — O24425 Gestational diabetes mellitus in childbirth, controlled by oral hypoglycemic drugs: Secondary | ICD-10-CM | POA: Diagnosis not present

## 2016-03-11 DIAGNOSIS — Z8249 Family history of ischemic heart disease and other diseases of the circulatory system: Secondary | ICD-10-CM

## 2016-03-11 DIAGNOSIS — Z6837 Body mass index (BMI) 37.0-37.9, adult: Secondary | ICD-10-CM

## 2016-03-11 DIAGNOSIS — Z833 Family history of diabetes mellitus: Secondary | ICD-10-CM

## 2016-03-11 DIAGNOSIS — Z23 Encounter for immunization: Secondary | ICD-10-CM | POA: Diagnosis not present

## 2016-03-11 LAB — TYPE AND SCREEN
ABO/RH(D): O POS
Antibody Screen: NEGATIVE

## 2016-03-11 LAB — GLUCOSE, CAPILLARY
GLUCOSE-CAPILLARY: 86 mg/dL (ref 65–99)
GLUCOSE-CAPILLARY: 82 mg/dL (ref 65–99)
Glucose-Capillary: 80 mg/dL (ref 65–99)
Glucose-Capillary: 83 mg/dL (ref 65–99)
Glucose-Capillary: 73 mg/dL (ref 65–99)

## 2016-03-11 LAB — CBC
HEMATOCRIT: 36.4 % (ref 36.0–46.0)
Hemoglobin: 12.6 g/dL (ref 12.0–15.0)
MCH: 28.9 pg (ref 26.0–34.0)
MCHC: 34.6 g/dL (ref 30.0–36.0)
MCV: 83.5 fL (ref 78.0–100.0)
Platelets: 270 10*3/uL (ref 150–400)
RBC: 4.36 MIL/uL (ref 3.87–5.11)
RDW: 14.3 % (ref 11.5–15.5)
WBC: 12 10*3/uL — ABNORMAL HIGH (ref 4.0–10.5)

## 2016-03-11 LAB — RPR: RPR Ser Ql: NONREACTIVE

## 2016-03-11 LAB — ABO/RH: ABO/RH(D): O POS

## 2016-03-11 MED ORDER — LACTATED RINGERS IV SOLN
INTRAVENOUS | Status: DC
  Administered 2016-03-11 (×4): via INTRAVENOUS

## 2016-03-11 MED ORDER — PHENYLEPHRINE 40 MCG/ML (10ML) SYRINGE FOR IV PUSH (FOR BLOOD PRESSURE SUPPORT)
80.0000 ug | PREFILLED_SYRINGE | INTRAVENOUS | Status: DC | PRN
  Filled 2016-03-11: qty 5

## 2016-03-11 MED ORDER — DIBUCAINE 1 % RE OINT
1.0000 "application " | TOPICAL_OINTMENT | RECTAL | Status: DC | PRN
  Administered 2016-03-12 – 2016-03-13 (×2): 1 via RECTAL
  Filled 2016-03-11 (×2): qty 28

## 2016-03-11 MED ORDER — WITCH HAZEL-GLYCERIN EX PADS
1.0000 "application " | MEDICATED_PAD | CUTANEOUS | Status: DC | PRN
  Administered 2016-03-12 – 2016-03-13 (×2): 1 via TOPICAL

## 2016-03-11 MED ORDER — DIPHENHYDRAMINE HCL 50 MG/ML IJ SOLN: 12.5000 mg | INTRAMUSCULAR | Status: DC | PRN

## 2016-03-11 MED ORDER — TETANUS-DIPHTH-ACELL PERTUSSIS 5-2.5-18.5 LF-MCG/0.5 IM SUSP: 0.5000 mL | Freq: Once | INTRAMUSCULAR | Status: DC

## 2016-03-11 MED ORDER — OXYCODONE-ACETAMINOPHEN 5-325 MG PO TABS
1.0000 | ORAL_TABLET | ORAL | Status: DC | PRN
  Administered 2016-03-12 – 2016-03-13 (×4): 1 via ORAL
  Filled 2016-03-11 (×4): qty 1

## 2016-03-11 MED ORDER — PHENYLEPHRINE 40 MCG/ML (10ML) SYRINGE FOR IV PUSH (FOR BLOOD PRESSURE SUPPORT)
80.0000 ug | PREFILLED_SYRINGE | INTRAVENOUS | Status: DC | PRN
  Filled 2016-03-11: qty 5
  Filled 2016-03-11: qty 10

## 2016-03-11 MED ORDER — TERBUTALINE SULFATE 1 MG/ML IJ SOLN
0.2500 mg | Freq: Once | INTRAMUSCULAR | Status: DC | PRN
  Filled 2016-03-11: qty 1

## 2016-03-11 MED ORDER — MISOPROSTOL 25 MCG QUARTER TABLET
25.0000 ug | ORAL_TABLET | ORAL | Status: DC | PRN
  Administered 2016-03-11: 25 ug via VAGINAL
  Filled 2016-03-11: qty 1
  Filled 2016-03-11: qty 0.25

## 2016-03-11 MED ORDER — DEXTROSE IN LACTATED RINGERS 5 % IV SOLN: INTRAVENOUS | Status: DC

## 2016-03-11 MED ORDER — BENZOCAINE-MENTHOL 20-0.5 % EX AERO
1.0000 "application " | INHALATION_SPRAY | CUTANEOUS | Status: DC | PRN
  Administered 2016-03-12 – 2016-03-13 (×2): 1 via TOPICAL
  Filled 2016-03-11 (×2): qty 56

## 2016-03-11 MED ORDER — SENNOSIDES-DOCUSATE SODIUM 8.6-50 MG PO TABS
2.0000 | ORAL_TABLET | ORAL | Status: DC
  Administered 2016-03-12: 2 via ORAL
  Filled 2016-03-11: qty 2

## 2016-03-11 MED ORDER — SOD CITRATE-CITRIC ACID 500-334 MG/5ML PO SOLN: 30.0000 mL | ORAL | Status: DC | PRN

## 2016-03-11 MED ORDER — LIDOCAINE HCL (PF) 1 % IJ SOLN
INTRAMUSCULAR | Status: DC | PRN
  Administered 2016-03-11 (×2): 4 mL

## 2016-03-11 MED ORDER — ACETAMINOPHEN 325 MG PO TABS: 650.0000 mg | ORAL_TABLET | ORAL | Status: DC | PRN

## 2016-03-11 MED ORDER — OXYTOCIN BOLUS FROM INFUSION
500.0000 mL | Freq: Once | INTRAVENOUS | Status: AC
  Administered 2016-03-11: 999 mL via INTRAVENOUS

## 2016-03-11 MED ORDER — EPHEDRINE 5 MG/ML INJ
10.0000 mg | INTRAVENOUS | Status: DC | PRN
  Filled 2016-03-11: qty 4

## 2016-03-11 MED ORDER — DEXTROSE 5 % IV SOLN
2.5000 10*6.[IU] | INTRAVENOUS | Status: DC
  Administered 2016-03-11 (×3): 2.5 10*6.[IU] via INTRAVENOUS
  Filled 2016-03-11 (×6): qty 2.5

## 2016-03-11 MED ORDER — LACTATED RINGERS IV SOLN: 500.0000 mL | Freq: Once | INTRAVENOUS | Status: DC

## 2016-03-11 MED ORDER — OXYTOCIN 40 UNITS IN LACTATED RINGERS INFUSION - SIMPLE MED
1.0000 m[IU]/min | INTRAVENOUS | Status: DC
  Administered 2016-03-11: 2 m[IU]/min via INTRAVENOUS
  Filled 2016-03-11: qty 1000

## 2016-03-11 MED ORDER — ONDANSETRON HCL 4 MG/2ML IJ SOLN: 4.0000 mg | INTRAMUSCULAR | Status: DC | PRN

## 2016-03-11 MED ORDER — FLEET ENEMA 7-19 GM/118ML RE ENEM: 1.0000 | ENEMA | RECTAL | Status: DC | PRN

## 2016-03-11 MED ORDER — LACTATED RINGERS IV SOLN
500.0000 mL | INTRAVENOUS | Status: DC | PRN
  Administered 2016-03-11: 500 mL via INTRAVENOUS

## 2016-03-11 MED ORDER — ONDANSETRON HCL 4 MG/2ML IJ SOLN
4.0000 mg | Freq: Four times a day (QID) | INTRAMUSCULAR | Status: DC | PRN
  Administered 2016-03-11: 4 mg via INTRAVENOUS
  Filled 2016-03-11: qty 2

## 2016-03-11 MED ORDER — OXYCODONE-ACETAMINOPHEN 5-325 MG PO TABS
1.0000 | ORAL_TABLET | ORAL | Status: DC | PRN
Start: 2016-03-11 — End: 2016-03-11

## 2016-03-11 MED ORDER — COCONUT OIL OIL: 1.0000 "application " | TOPICAL_OIL | Status: DC | PRN

## 2016-03-11 MED ORDER — ONDANSETRON HCL 4 MG PO TABS: 4.0000 mg | ORAL_TABLET | ORAL | Status: DC | PRN

## 2016-03-11 MED ORDER — LIDOCAINE HCL (PF) 1 % IJ SOLN
30.0000 mL | INTRAMUSCULAR | Status: AC | PRN
  Administered 2016-03-11: 30 mL via SUBCUTANEOUS
  Filled 2016-03-11: qty 30

## 2016-03-11 MED ORDER — DIPHENHYDRAMINE HCL 25 MG PO CAPS: 25.0000 mg | ORAL_CAPSULE | Freq: Four times a day (QID) | ORAL | Status: DC | PRN

## 2016-03-11 MED ORDER — SIMETHICONE 80 MG PO CHEW: 80.0000 mg | CHEWABLE_TABLET | ORAL | Status: DC | PRN

## 2016-03-11 MED ORDER — OXYCODONE-ACETAMINOPHEN 5-325 MG PO TABS
2.0000 | ORAL_TABLET | ORAL | Status: DC | PRN
  Administered 2016-03-12 – 2016-03-13 (×4): 2 via ORAL
  Filled 2016-03-11 (×4): qty 2

## 2016-03-11 MED ORDER — FENTANYL 2.5 MCG/ML BUPIVACAINE 1/10 % EPIDURAL INFUSION (WH - ANES)
14.0000 mL/h | INTRAMUSCULAR | Status: DC | PRN
Start: 2016-03-11 — End: 2016-03-11
  Administered 2016-03-11: 14 mL/h via EPIDURAL
  Filled 2016-03-11 (×2): qty 125

## 2016-03-11 MED ORDER — OXYCODONE-ACETAMINOPHEN 5-325 MG PO TABS: 2.0000 | ORAL_TABLET | ORAL | Status: DC | PRN

## 2016-03-11 MED ORDER — OXYTOCIN 40 UNITS IN LACTATED RINGERS INFUSION - SIMPLE MED: 2.5000 [IU]/h | INTRAVENOUS | Status: DC

## 2016-03-11 MED ORDER — PENICILLIN G POTASSIUM 5000000 UNITS IJ SOLR
5.0000 10*6.[IU] | Freq: Once | INTRAVENOUS | Status: AC
  Administered 2016-03-11: 5 10*6.[IU] via INTRAVENOUS
  Filled 2016-03-11: qty 5

## 2016-03-11 MED ORDER — HYDROMORPHONE HCL 1 MG/ML IJ SOLN: 1.0000 mg | INTRAMUSCULAR | Status: DC | PRN

## 2016-03-11 MED ORDER — ACETAMINOPHEN 325 MG PO TABS
650.0000 mg | ORAL_TABLET | ORAL | Status: DC | PRN
  Administered 2016-03-11: 650 mg via ORAL
  Filled 2016-03-11: qty 2

## 2016-03-11 MED ORDER — ZOLPIDEM TARTRATE 5 MG PO TABS: 5.0000 mg | ORAL_TABLET | Freq: Every evening | ORAL | Status: DC | PRN

## 2016-03-11 MED ORDER — PRENATAL MULTIVITAMIN CH
1.0000 | ORAL_TABLET | Freq: Every day | ORAL | Status: DC
  Administered 2016-03-12 – 2016-03-13 (×2): 1 via ORAL
  Filled 2016-03-11 (×2): qty 1

## 2016-03-11 NOTE — Progress Notes (Signed)
S: Starting to feel cramping. Otherwise no complaints.   O:  Vitals  Vitals:   03/11/16 0604 03/11/16 0605 03/11/16 0649 03/11/16 0824  BP: 132/70 132/70 127/64   Pulse: (!) 102 (!) 102 (!) 102   Resp:   16   Temp:    97.8 F (36.6 C)  TempSrc:    Oral  Weight:      Height:        Gen: NAD SVE: 3/50/-2, AROM for clear fluid FHT: cat 1 Toco: q2-4 min  A/P: Pt is a G3P0 @39 .1wga for IOL for A2GDM.  IOL:  S/p AROM Cont pit, at 4 now  A2GDM:  Bs per protocol, averaging in 80s now, reassuring Prev c/w glyburide 7.5mg   NSAID allergy - will avoid  GBS pos, cont PCN  Lucillie Garfinkel MD

## 2016-03-11 NOTE — Anesthesia Procedure Notes (Addendum)
Epidural Patient location during procedure: OB  Staffing Anesthesiologist: Lauretta Grill Performed: anesthesiologist   Preanesthetic Checklist Completed: patient identified, site marked, surgical consent, pre-op evaluation, timeout performed, IV checked, risks and benefits discussed and monitors and equipment checked  Epidural Patient position: sitting Prep: site prepped and draped and DuraPrep Patient monitoring: continuous pulse ox and blood pressure Approach: midline Location: L3-L4 Injection technique: LOR saline  Needle:  Needle type: Tuohy  Needle gauge: 17 G Needle length: 9 cm and 9 Needle insertion depth: 8 cm Catheter type: closed end flexible Catheter size: 19 Gauge Catheter at skin depth: 12 cm Test dose: negative  Assessment Events: blood not aspirated, injection not painful, no injection resistance, negative IV test and no paresthesia  Additional Notes Patient identified. Risks/Benefits/Options discussed with patient including but not limited to bleeding, infection, nerve damage, paralysis, failed block, incomplete pain control, headache, blood pressure changes, nausea, vomiting, reactions to medication both or allergic, itching and postpartum back pain. Confirmed with bedside nurse the patient's most recent platelet count. Confirmed with patient that they are not currently taking any anticoagulation, have any bleeding history or any family history of bleeding disorders. Patient expressed understanding and wished to proceed. All questions were answered. Sterile technique was used throughout the entire procedure. Please see nursing notes for vital signs. Test dose was given through epidural catheter and negative prior to continuing to dose epidural or start infusion. Warning signs of high block given to the patient including shortness of breath, tingling/numbness in hands, complete motor block, or any concerning symptoms with instructions to call for help. Patient was given  instructions on fall risk and not to get out of bed. All questions and concerns addressed with instructions to call with any issues or inadequate analgesia.

## 2016-03-11 NOTE — Progress Notes (Signed)
S: Comf w/cle.  O:  Vitals:   03/11/16 0649 03/11/16 0824  BP: 127/64   Pulse: (!) 102   Resp: 16   Temp:  97.8 F (36.6 C)    Gen: NAD SVE: 4/50/-2 FHT: cat 1 Toco: q2-4 min  A/P: Pt is a G3P0 @39 .1wga for IOL for A2GDM.  IOL:  S/p AROM Cont pit, at 6 now  A2GDM:  Bs per protocol, averaging in 80s now, reassuring Prev c/w glyburide 7.5mg   NSAID allergy - will avoid  GBS pos, cont PCN  Lucillie Garfinkel MD

## 2016-03-11 NOTE — Anesthesia Preprocedure Evaluation (Signed)
Anesthesia Evaluation  Patient identified by MRN, date of birth, ID band Patient awake    Reviewed: Allergy & Precautions, NPO status , Patient's Chart, lab work & pertinent test results  History of Anesthesia Complications Negative for: history of anesthetic complications  Airway Mallampati: II  TM Distance: >3 FB Neck ROM: Full    Dental no notable dental hx. (+) Dental Advisory Given   Pulmonary neg pulmonary ROS, former smoker,    Pulmonary exam normal breath sounds clear to auscultation       Cardiovascular negative cardio ROS Normal cardiovascular exam Rhythm:Regular Rate:Normal     Neuro/Psych  Headaches, PSYCHIATRIC DISORDERS Anxiety Depression    GI/Hepatic negative GI ROS, Neg liver ROS,   Endo/Other  diabetes, Gestationalobesity  Renal/GU negative Renal ROS  negative genitourinary   Musculoskeletal negative musculoskeletal ROS (+)   Abdominal   Peds negative pediatric ROS (+)  Hematology negative hematology ROS (+)   Anesthesia Other Findings   Reproductive/Obstetrics (+) Pregnancy                             Anesthesia Physical Anesthesia Plan  ASA: II  Anesthesia Plan: Epidural   Post-op Pain Management:    Induction:   Airway Management Planned:   Additional Equipment:   Intra-op Plan:   Post-operative Plan:   Informed Consent: I have reviewed the patients History and Physical, chart, labs and discussed the procedure including the risks, benefits and alternatives for the proposed anesthesia with the patient or authorized representative who has indicated his/her understanding and acceptance.   Dental advisory given  Plan Discussed with: CRNA  Anesthesia Plan Comments:         Anesthesia Quick Evaluation

## 2016-03-11 NOTE — Progress Notes (Addendum)
Assumed care of patient after receiving report from [place].  1607: MD at bs. Started pushing with ctx   1935: relinquished care over to RN Lamount Cranker

## 2016-03-11 NOTE — Progress Notes (Signed)
SVE 7cm per Rn, will re-SVE in ~ 2hrs or prn pressure. Anticipate second stage soon. FHT reassuring.   Lucillie Garfinkel MD

## 2016-03-11 NOTE — Anesthesia Pain Management Evaluation Note (Signed)
  CRNA Pain Management Visit Note  Patient: Alexis Craig, 35 y.o., female  "Hello I am a member of the anesthesia team at [facility]. We have an anesthesia team available at all times to provide care throughout the hospital, including epidural management and anesthesia for C-section. I don't know your plan for the delivery whether it a natural birth, water birth, IV sedation, nitrous supplementation, doula or epidural, but we want to meet your pain goals."   1.Was your pain managed to your expectations on prior hospitalizations?   Yes   2.What is your expectation for pain management during this hospitalization?     Epidural  3.How can we help you reach that goal?  Explaining options for pain contol  Record the patient's initial score and the patient's pain goal.   Pain: 2  Pain Goal: 6 [facility] wants you to be able to say your pain was always managed very well.  Tanaya Dunigan 03/11/2016

## 2016-03-12 LAB — COMPREHENSIVE METABOLIC PANEL
ALBUMIN: 2.8 g/dL — AB (ref 3.5–5.0)
ALK PHOS: 117 U/L (ref 38–126)
ALT: 25 U/L (ref 14–54)
ANION GAP: 11 (ref 5–15)
AST: 28 U/L (ref 15–41)
BUN: 11 mg/dL (ref 6–20)
CALCIUM: 8.9 mg/dL (ref 8.9–10.3)
CO2: 16 mmol/L — AB (ref 22–32)
Chloride: 108 mmol/L (ref 101–111)
Creatinine, Ser: 0.7 mg/dL (ref 0.44–1.00)
GFR calc Af Amer: >60 mL/min (ref >60)
GFR calc non Af Amer: >60 mL/min (ref >60)
GLUCOSE: 214 mg/dL — AB (ref 65–99)
Potassium: 3.2 mmol/L — ABNORMAL LOW (ref 3.5–5.1)
SODIUM: 135 mmol/L (ref 135–145)
Total Bilirubin: 0.4 mg/dL (ref 0.3–1.2)
Total Protein: 6.1 g/dL — ABNORMAL LOW (ref 6.5–8.1)

## 2016-03-12 LAB — CBC
HEMATOCRIT: 37.2 % (ref 36.0–46.0)
HEMOGLOBIN: 12.9 g/dL (ref 12.0–15.0)
MCH: 29.9 pg (ref 26.0–34.0)
MCHC: 34.7 g/dL (ref 30.0–36.0)
MCV: 86.1 fL (ref 78.0–100.0)
Platelets: 260 10*3/uL (ref 150–400)
RBC: 4.32 MIL/uL (ref 3.87–5.11)
RDW: 14.7 % (ref 11.5–15.5)
WBC: 23.1 10*3/uL — ABNORMAL HIGH (ref 4.0–10.5)

## 2016-03-12 LAB — GLUCOSE, CAPILLARY
Glucose-Capillary: 124 mg/dL — ABNORMAL HIGH (ref 65–99)
Glucose-Capillary: 122 mg/dL — ABNORMAL HIGH (ref 65–99)
Glucose-Capillary: 94 mg/dL (ref 65–99)

## 2016-03-12 LAB — URIC ACID: Uric Acid, Serum: 5 mg/dL (ref 2.3–6.6)

## 2016-03-12 LAB — CCBB MATERNAL DONOR DRAW

## 2016-03-12 LAB — LACTATE DEHYDROGENASE: LDH: 170 U/L (ref 98–192)

## 2016-03-12 MED ORDER — HYDROCORTISONE ACE-PRAMOXINE 1-1 % RE FOAM
1.0000 | Freq: Two times a day (BID) | RECTAL | Status: DC
  Administered 2016-03-12 – 2016-03-13 (×3): 1 via RECTAL
  Filled 2016-03-12 (×3): qty 10

## 2016-03-12 NOTE — Progress Notes (Signed)
Patient ambulated out of bed with minimal to no assistance, voided an adequate amount of urine. Walked to bed with no assistance.

## 2016-03-12 NOTE — Lactation Note (Signed)
This note was copied from a baby's chart. Lactation Consultation Note Initial visit at 56 hours of age.  Mom reports good feedings, denies pain and has no concerns at this time.  Baby has had previous LATCH scores of "7-8" with 5 voids and 6 stools.  [facility] LC resources given and discussed.  Encouraged to feed with early cues on demand.  Early newborn behavior discussed.  Hand expression demonstrated by mom with drops of colostrum visible.  Mom to call for assist as needed.    Patient Name: Alexis Craig Pham [id number] Date: 03/12/2016 Reason for consult: Initial assessment   Maternal Data Has patient been taught Hand Expression?: Yes Does the patient have breastfeeding experience prior to this delivery?: No  Feeding Feeding Type: Breast Fed Length of feed: 20 min  LATCH Score/Interventions Latch: Repeated attempts needed to sustain latch, nipple held in mouth throughout feeding, stimulation needed to elicit sucking reflex. Intervention(s): Skin to skin;Teach feeding cues;Waking techniques Intervention(s): Adjust position;Assist with latch;Breast compression  Audible Swallowing: A few with stimulation Intervention(s): Skin to skin;Hand expression Intervention(s): Skin to skin;Hand expression  Type of Nipple: Everted at rest and after stimulation  Comfort (Breast/Nipple): Soft / non-tender     Hold (Positioning): No assistance needed to correctly position infant at breast. Intervention(s): Breastfeeding basics reviewed  LATCH Score: 8  Lactation Tools Discussed/Used     Consult Status Consult Status: Follow-up Date: 03/13/16 Follow-up type: In-patient    Justice Britain 03/12/2016, 12:46 PM

## 2016-03-12 NOTE — Anesthesia Postprocedure Evaluation (Signed)
Anesthesia Post Note  Patient: Alexis Craig  Procedure(s) Performed: * No procedures listed *  Patient location during evaluation: Mother Baby Anesthesia Type: Epidural Level of consciousness: awake Pain management: satisfactory to patient Vital Signs Assessment: post-procedure vital signs reviewed and stable Respiratory status: spontaneous breathing Cardiovascular status: stable Anesthetic complications: no     Last Vitals:  Vitals:   03/12/16 0200 03/12/16 0920  BP: (!) 148/79 139/90  Pulse: 99 (!) 102  Resp: 18 16  Temp: 37.2 C 36.8 C    Last Pain:  Vitals:   03/12/16 1110  TempSrc:   PainSc: 3    Pain Goal: Patients Stated Pain Goal: 3 (03/12/16 1110)               Casimer Lanius

## 2016-03-12 NOTE — Progress Notes (Signed)
Post Partum Day 1 Subjective: up ad lib, voiding, tolerating PO and c/o perineal pain  Objective: Blood pressure (!) 148/79, pulse 99, temperature 99 F (37.2 C), temperature source Oral, resp. rate 18, height 5' 2.5" (1.588 m), weight 210 lb (95.3 kg), last menstrual period 06/11/2015, unknown if currently breastfeeding.  Physical Exam:  General: alert and cooperative Lochia: appropriate Uterine Fundus: firm Incision: healing well, 4 hemorrhoids noted,  DVT Evaluation: No evidence of DVT seen on physical exam. Negative Homan's sign. No cords or calf tenderness. Calf/Ankle edema is present.   Recent Labs  03/11/16 0120 03/12/16 0013  HGB 12.6 12.9  HCT 36.4 37.2    Assessment/Plan: Plan for discharge tomorrow   Proctofoam HC, tucks pads CBC in am   POCT glucose monitoring  LOS: 1 day   Yesenia Locurto G 03/12/2016, 8:27 AM

## 2016-03-12 NOTE — Plan of Care (Signed)
Problem: Pain Management: Goal: General experience of comfort will improve and pain level will decrease Outcome: Progressing Pt reporting perineal pain.  Pt has hemorrhoids and a 2nd degree laceration.  Pt given dermaplast spray, tucks pads, proctofoam, and dibucaine.  Pt also taking percocet as needed.

## 2016-03-13 LAB — CBC
HCT: 34.6 % — ABNORMAL LOW (ref 36.0–46.0)
HEMOGLOBIN: 11.7 g/dL — AB (ref 12.0–15.0)
MCH: 28.9 pg (ref 26.0–34.0)
MCHC: 33.8 g/dL (ref 30.0–36.0)
MCV: 85.4 fL (ref 78.0–100.0)
PLATELETS: 253 10*3/uL (ref 150–400)
RBC: 4.05 MIL/uL (ref 3.87–5.11)
RDW: 14.7 % (ref 11.5–15.5)
WBC: 13.2 10*3/uL — AB (ref 4.0–10.5)

## 2016-03-13 LAB — GLUCOSE, CAPILLARY: GLUCOSE-CAPILLARY: 94 mg/dL (ref 65–99)

## 2016-03-13 MED ORDER — INFLUENZA VAC SPLIT QUAD 0.5 ML IM SUSY
0.5000 mL | PREFILLED_SYRINGE | Freq: Once | INTRAMUSCULAR | Status: AC
  Administered 2016-03-13: 0.5 mL via INTRAMUSCULAR

## 2016-03-13 MED ORDER — OXYCODONE-ACETAMINOPHEN 5-325 MG PO TABS: 1.0000 | ORAL_TABLET | ORAL | 0 refills | Status: DC | PRN

## 2016-03-13 NOTE — Progress Notes (Signed)
Post Partum Day 2 Subjective: up ad lib, voiding, tolerating PO, + flatus and denies HA, blurred vision. or RUQ pain. Baby is under phototherapy. Mom reports pain with hemorrhoids  Objective: Blood pressure (!) 146/89, pulse 98, temperature 98.5 F (36.9 C), temperature source Oral, resp. rate 18, height 5' 2.5" (1.588 m), weight 210 lb (95.3 kg), last menstrual period 06/11/2015, unknown if currently breastfeeding.  Physical Exam:  General: alert and cooperative Lochia: appropriate Uterine Fundus: firm Incision: healing well, 2 large hemorrhoids, one questionably thrombosed dVT Evaluation: No evidence of DVT seen on physical exam. Negative Homan's sign. No cords or calf tenderness. Calf/Ankle edema is present. DTR's 2+, no clonus   Recent Labs  03/12/16 0013 03/13/16 0537  HGB 12.9 11.7*  HCT 37.2 34.6*    Assessment/Plan: Discharge home and Breastfeeding   Reviewed signs and symptoms of PIH.  Plan to discuss with DR. Grewal regarding ? Thrombosed hemorrhoids   LOS: 2 days   Kruze Atchley G 03/13/2016, 8:37 AM

## 2016-03-13 NOTE — Discharge Summary (Signed)
Obstetric Discharge Summary Reason for Admission: induction of labor Prenatal Procedures: ultrasound Intrapartum Procedures: spontaneous vaginal delivery Postpartum Procedures: none Complications-Operative and Postpartum: 2 degree perineal laceration Hemoglobin  Date Value Ref Range Status  03/13/2016 11.7 (L) 12.0 - 15.0 g/dL Final   HCT  Date Value Ref Range Status  03/13/2016 34.6 (L) 36.0 - 46.0 % Final    Physical Exam:  General: alert and cooperative Lochia: appropriate Uterine Fundus: firm Incision: healing well, hemorrhoids DVT Evaluation: No evidence of DVT seen on physical exam. Negative Homan's sign. No cords or calf tenderness. Calf/Ankle edema is present. DTR's 2+ no clonus  Discharge Diagnoses: Term Pregnancy-delivered  Discharge Information: Date: 03/13/2016 Activity: pelvic rest Diet: routine Medications: PNV and Percocet Condition: stable Instructions: refer to practice specific booklet Discharge to: home   Newborn Data: Live born female  Birth Weight: 7 lb 2.5 oz (3245 g) APGAR: 8, 9  Home with mother.  Alexis Craig G 03/13/2016, 8:43 AM

## 2016-03-13 NOTE — Lactation Note (Signed)
This note was copied from a baby's chart. Lactation Consultation Note  Patient Name: Alexis Craig [id number] Date: 03/13/2016 Reason for consult: Follow-up assessment Baby at 45 hr of life. Baby with 11 bf, 4 wets, and 6 stools in the last 24hr. Mom reports baby is latching well. She denies breast or nipple pain, voiced no concerns. Discussed baby behavior, feeding frequency, baby belly size, voids, wt loss, breast changes, and nipple care. Parents are aware of lactation services and support group. They will call as needed.    Maternal Data    Feeding Feeding Type: Breast Fed Length of feed: 20 min  LATCH Score/Interventions Latch: Grasps breast easily, tongue down, lips flanged, rhythmical sucking. Intervention(s): Adjust position;Breast massage  Audible Swallowing: A few with stimulation Intervention(s): Skin to skin  Type of Nipple: Everted at rest and after stimulation  Comfort (Breast/Nipple): Soft / non-tender     Hold (Positioning): No assistance needed to correctly position infant at breast.  LATCH Score: 9  Lactation Tools Discussed/Used     Consult Status Consult Status: Follow-up Date: 03/14/16 Follow-up type: In-patient    Denzil Hughes 03/13/2016, 4:21 PM

## 2016-03-14 ENCOUNTER — Ambulatory Visit: Payer: Self-pay

## 2016-03-14 NOTE — Lactation Note (Signed)
This note was copied from a baby's chart. Lactation Consultation Note  Patient Name: Alexis Craig [id number] Date: 03/14/2016 Reason for consult: Follow-up assessment;Hyperbilirubinemia Mom reports baby is nursing well, denies nipple tenderness. Baby has been to breast 10 times in 24 hours, nursing for 10-60 minutes. 6 voids/5 stools in 24 hours. LC encouraged Mom to continue to BF with feeding ques 8-12 times or more in 24 hours, try to keep baby nursing for 15-30 minutes, both breasts with most feedings. Advised if baby sleepy at breast due to jaundice, Mom can post pump for 15 minutes and give baby back any amount of EBM she receives. Baby going home on photo therapy with f/u at [facility] tomorrow. Engorgement care reviewed if needed, advised of OP services and support group. Encouraged to call for questions/concerns.   Maternal Data    Feeding Feeding Type: Breast Fed Length of feed: 25 min  LATCH Score/Interventions Latch: Grasps breast easily, tongue down, lips flanged, rhythmical sucking. Intervention(s): Breast massage  Audible Swallowing: A few with stimulation  Type of Nipple: Everted at rest and after stimulation  Comfort (Breast/Nipple): Soft / non-tender     Hold (Positioning): Assistance needed to correctly position infant at breast and maintain latch. Intervention(s): Breastfeeding basics reviewed;Support Pillows;Position options;Skin to skin  LATCH Score: 8  Lactation Tools Discussed/Used     Consult Status Consult Status: Complete Date: 03/14/16 Follow-up type: In-patient    Katrine Coho 03/14/2016, 9:40 AM

## 2016-04-22 DIAGNOSIS — Z1389 Encounter for screening for other disorder: Secondary | ICD-10-CM | POA: Diagnosis not present

## 2016-12-16 ENCOUNTER — Ambulatory Visit (INDEPENDENT_AMBULATORY_CARE_PROVIDER_SITE_OTHER): Payer: BLUE CROSS/BLUE SHIELD | Admitting: Internal Medicine

## 2016-12-16 ENCOUNTER — Encounter: Payer: Self-pay | Admitting: Internal Medicine

## 2016-12-16 VITALS — BP 134/78 | HR 83 | Temp 98.4°F | Ht 62.5 in | Wt 201.4 lb

## 2016-12-16 DIAGNOSIS — Z0000 Encounter for general adult medical examination without abnormal findings: Secondary | ICD-10-CM | POA: Diagnosis not present

## 2016-12-16 MED ORDER — LORAZEPAM 0.5 MG PO TABS: 0.5000 mg | ORAL_TABLET | Freq: Two times a day (BID) | ORAL | 0 refills | Status: DC | PRN

## 2016-12-16 NOTE — Progress Notes (Signed)
Subjective:    Patient ID: Alexis Craig, female    DOB: 30-Nov-1980, 36 y.o.   MRN: [id number]  HPI  36 year old patient who is seen today for a preventive health examination. She is a gravida 1 para 1 abortus 0 and delivered in September of last year.  She continues to breast-feed.  Doing quite well.  Her daughter sleeps through the night She has had some postpartum anxiety and has been prescribed sertraline with nice benefit. She has insomnia and also is taking Ambien.  She is requesting a small supply of lorazepam to assist with a flight to the [place] history.  Works full time  Current Allergies: No known allergies   Past Medical History:  Reviewed history from 03/09/2008 and no changes required:  history scarlet fever, November 2009  Headache  Nephrolithiasis, hx of  tobacco abuse, h/o   history of EtOH  Anxiety  intention tremor  Social history.  Married.  Discontinued tobacco December 2012  Family history 2 brothers in excellent health.  Both parents have type 2 diabetes  Past Medical History:  Diagnosis Date  . Anal fissure 04/09/2009  . ANXIETY 03/09/2008  . Depression   . Gestational diabetes    glyburide  . Headache(784.0) 08/23/2007  . History of alcohol abuse    7 years ago  . Hx of varicella   . INSOMNIA 10/05/2007  . INTENTION TREMOR 06/13/2008  . NEPHROLITHIASIS, HX OF 08/23/2007  . RECTAL BLEEDING 04/09/2009  . RESTLESS LEG SYNDROME 01/22/2009     Social History   Social History  . Marital status: Married    Spouse name: N/A  . Number of children: N/A  . Years of education: N/A   Occupational History  . Not on file.   Social History Main Topics  . Smoking status: Former Smoker    Types: Cigarettes  . Smokeless tobacco: Former Systems developer    Quit date: 06/24/2011  . Alcohol use No     Comment: hx etoh abuse 7 years ago  . Drug use: No  . Sexual activity: Not on file   Other Topics  Concern  . Not on file   Social History Narrative  . No narrative on file    Past Surgical History:  Procedure Laterality Date  . EXCISIONAL HEMORRHOIDECTOMY      Family History  Problem Relation Age of Onset  . Diabetes Mother   . Thyroid disease Mother   . Cancer Mother   . Heart disease Mother   . Hyperlipidemia Father   . Diabetes Father   . Heart disease Maternal Uncle   . Cancer Maternal Grandmother   . Heart disease Paternal Grandfather     Allergies  Allergen Reactions  . Nsaids Rash    Blisters also  . Ibuprofen     Fixed drug reaction with bullous dermatitis    Current Outpatient Prescriptions on File Prior to Visit  Medication Sig Dispense Refill  . acetaminophen (TYLENOL) 500 MG tablet Take 500 mg by mouth every 6 (six) hours as needed for moderate pain.      No current facility-administered medications on file prior to visit.     BP 134/78 (BP Location: Left Arm, Patient Position: Sitting, Cuff Size: Normal)   Pulse 83   Temp 98.4 F (36.9 C) (Oral)   Ht 5' 2.5" (1.588 m)   Wt 201 lb 6.4 oz (91.4 kg)   SpO2 99%   BMI 36.25 kg/m     Review  of Systems  Constitutional: Negative.   HENT: Negative for congestion, dental problem, hearing loss, rhinorrhea, sinus pressure, sore throat and tinnitus.   Eyes: Negative for pain, discharge and visual disturbance.  Respiratory: Negative for cough and shortness of breath.   Cardiovascular: Negative for chest pain, palpitations and leg swelling.  Gastrointestinal: Negative for abdominal distention, abdominal pain, blood in stool, constipation, diarrhea, nausea and vomiting.  Genitourinary: Negative for difficulty urinating, dysuria, flank pain, frequency, hematuria, pelvic pain, urgency, vaginal bleeding, vaginal discharge and vaginal pain.  Musculoskeletal: Negative for arthralgias, gait problem and joint swelling.  Skin: Negative for rash.  Neurological: Negative for dizziness, syncope, speech difficulty,  weakness, numbness and headaches.  Hematological: Negative for adenopathy.  Psychiatric/Behavioral: Positive for sleep disturbance. Negative for agitation, behavioral problems and dysphoric mood. The patient is nervous/anxious.        Objective:   Physical Exam  Constitutional: She is oriented to person, place, and time. She appears well-developed and well-nourished.  Weight 201 Blood pressure 130/70  HENT:  Head: Normocephalic and atraumatic.  Right Ear: External ear normal.  Left Ear: External ear normal.  Mouth/Throat: Oropharynx is clear and moist.  Eyes: Conjunctivae and EOM are normal.  Neck: Normal range of motion. Neck supple. No JVD present. No thyromegaly present.  Cardiovascular: Normal rate, regular rhythm, normal heart sounds and intact distal pulses.   No murmur heard. Pulmonary/Chest: Effort normal and breath sounds normal. She has no wheezes. She has no rales.  Abdominal: Soft. Bowel sounds are normal. She exhibits no distension and no mass. There is no tenderness. There is no rebound and no guarding.  Genitourinary: Vagina normal.  Musculoskeletal: Normal range of motion. She exhibits no edema or tenderness.  Neurological: She is alert and oriented to person, place, and time. She has normal reflexes. No cranial nerve deficit. She exhibits normal muscle tone. Coordination normal.  Skin: Skin is warm and dry. No rash noted.  Psychiatric: She has a normal mood and affect. Her behavior is normal.          Assessment & Plan:   Preventive health exam Anxiety disorder.  Continue sertraline 100 mg.  Prescription for lorazepam, dispensed due to anxiety associated with flying Overweight.  Weight loss encouraged  Nyoka Cowden

## 2016-12-16 NOTE — Patient Instructions (Addendum)
WE NOW OFFER   [facility] FAST TRACK!!!  SAME DAY Appointments for ACUTE CARE  Such as: Sprains, Injuries, cuts, abrasions, rashes, muscle pain, joint pain, back pain Colds, flu, sore throats, headache, allergies, cough, fever  Ear pain, sinus and eye infections Abdominal pain, nausea, vomiting, diarrhea, upset stomach Animal/insect bites  3 Easy Ways to Schedule: Walk-In Scheduling Call in scheduling Mychart Sign-up: [email or web address]    Limit your sodium (Salt) intake  You need to lose weight.  Consider a lower calorie diet and regular exercise.    It is important that you exercise regularly, at least 20 minutes 3 to 4 times per week.  If you develop chest pain or shortness of breath seek  medical attention.

## 2017-05-18 ENCOUNTER — Ambulatory Visit (INDEPENDENT_AMBULATORY_CARE_PROVIDER_SITE_OTHER): Payer: BLUE CROSS/BLUE SHIELD

## 2017-05-18 ENCOUNTER — Encounter: Payer: Self-pay | Admitting: Podiatry

## 2017-05-18 ENCOUNTER — Ambulatory Visit: Payer: BLUE CROSS/BLUE SHIELD | Admitting: Podiatry

## 2017-05-18 VITALS — BP 128/87 | HR 89 | Ht 63.0 in | Wt 188.0 lb

## 2017-05-18 DIAGNOSIS — M79671 Pain in right foot: Secondary | ICD-10-CM

## 2017-05-18 DIAGNOSIS — M79673 Pain in unspecified foot: Secondary | ICD-10-CM | POA: Diagnosis not present

## 2017-05-18 DIAGNOSIS — M79672 Pain in left foot: Secondary | ICD-10-CM | POA: Diagnosis not present

## 2017-05-18 DIAGNOSIS — M722 Plantar fascial fibromatosis: Secondary | ICD-10-CM

## 2017-05-18 DIAGNOSIS — M216X9 Other acquired deformities of unspecified foot: Secondary | ICD-10-CM

## 2017-05-18 MED ORDER — METHYLPREDNISOLONE 4 MG PO TBPK: ORAL_TABLET | ORAL | 0 refills | Status: DC

## 2017-05-18 NOTE — Progress Notes (Signed)
Subjective:    Patient ID: Alexis Craig, female    DOB: 1980/07/15, 36 y.o.   MRN: [id number]  HPI  Chief Complaint  Patient presents with  . Foot Pain    bilateral foot and heel pain   Alexis Craig presents the office today for concerns of bilateral foot pain which is been ongoing for about 1 year after she was pregnant.  She states that she gets pain in the morning when she first gets up or if she is been sitting for some time and stands back up.  She states the pain is intermittent.  She denies any numbness or tingling.  Denies any recent injury.  No swelling or redness that she has noticed.  The pain does not wake her up at night.  She said no recent treatment for this.  She has no other concerns today.    Review of Systems  Musculoskeletal: Positive for myalgias.  All other systems reviewed and are negative.  Past Medical History:  Diagnosis Date  . Anal fissure 04/09/2009  . ANXIETY 03/09/2008  . Depression   . Gestational diabetes    glyburide  . Headache(784.0) 08/23/2007  . History of alcohol abuse    7 years ago  . Hx of varicella   . INSOMNIA 10/05/2007  . INTENTION TREMOR 06/13/2008  . NEPHROLITHIASIS, HX OF 08/23/2007  . RECTAL BLEEDING 04/09/2009  . RESTLESS LEG SYNDROME 01/22/2009    Past Surgical History:  Procedure Laterality Date  . EXCISIONAL HEMORRHOIDECTOMY       Current Outpatient Medications:  .  acetaminophen (TYLENOL) 500 MG tablet, Take 500 mg by mouth every 6 (six) hours as needed for moderate pain. , Disp: , Rfl:  .  LORazepam (ATIVAN) 0.5 MG tablet, Take 1 tablet (0.5 mg total) by mouth 2 (two) times daily as needed for anxiety., Disp: 30 tablet, Rfl: 0 .  methylPREDNISolone (MEDROL DOSEPAK) 4 MG TBPK tablet, Take as directed, Disp: 21 tablet, Rfl: 0 .  NORLYDA 0.35 MG tablet, TK UTD, Disp: , Rfl: 12 .  sertraline (ZOLOFT) 100 MG tablet, TK 1 T PO QD, Disp: , Rfl: 12 .  zolpidem (AMBIEN) 10 MG tablet, TK 1 T PO QHS, Disp: , Rfl:  2  Allergies  Allergen Reactions  . Nsaids Rash    Blisters also  . Ibuprofen     Fixed drug reaction with bullous dermatitis    Social History   Socioeconomic History  . Marital status: Married    Spouse name: Not on file  . Number of children: Not on file  . Years of education: Not on file  . Highest education level: Not on file  Social Needs  . Financial resource strain: Not on file  . Food insecurity - worry: Not on file  . Food insecurity - inability: Not on file  . Transportation needs - medical: Not on file  . Transportation needs - non-medical: Not on file  Occupational History  . Not on file  Tobacco Use  . Smoking status: Former Smoker    Types: Cigarettes  . Smokeless tobacco: Former Systems developer    Quit date: 06/24/2011  Substance and Sexual Activity  . Alcohol use: No    Comment: hx etoh abuse 7 years ago  . Drug use: No  . Sexual activity: Not on file  Other Topics Concern  . Not on file  Social History Narrative  . Not on file        Objective:   Physical  Exam  General: AAO x3, NAD  Dermatological:  Nails x 10 are well manicured; remaining integument appears unremarkable at this time. There are no open sores, no preulcerative lesions, no rash or signs of infection present.  Vascular: Dorsalis Pedis artery and Posterior Tibial artery pedal pulses are 2/4 bilateral with immedate capillary fill time.  There is no pain with calf compression, swelling, warmth, erythema.   Neruologic: Grossly intact via light touch bilateral. Protective threshold with Semmes Wienstein monofilament intact to all pedal sites bilateral. Negative tinel sign.   Musculoskeletal: There is mild tenderness palpation on the medial aspect of the foot on the plantar fascia on the arch of the foot.  Plantar fascia appears to be intact.  There is no area pinpoint tenderness there is no pain to vibratory sensation.  There is no pain on the Achilles tendon and that appears to be intact.  Upon  gait evaluation she does have a more cavus foot type.  There is hyperkeratotic lesions left and right foot submetatarsal 1 and 5.  There is no other areas of tenderness identified at this time.  Muscular strength 5/5 in all groups tested bilateral.  Gait: Unassisted, Nonantalgic.      Assessment & Plan:  36 year old female with bilateral foot pain; likely plantar fasciitis -Treatment options discussed including all alternatives, risks, and complications -Etiology of symptoms were discussed -X-rays were obtained and reviewed with the patient. No evidence of acute fracture.  -Medrol dose pack -Night splint to engage the Windlass mechanism.  -Discussed orthotics. She would like to proceed with custom molded inserts. Rick molded her today for this.  -RTC 3 weeks to PUO or sooner if needed.  Trula Slade DPM

## 2017-05-18 NOTE — Patient Instructions (Signed)

## 2017-06-03 DIAGNOSIS — Z6831 Body mass index (BMI) 31.0-31.9, adult: Secondary | ICD-10-CM | POA: Diagnosis not present

## 2017-06-03 DIAGNOSIS — Z01419 Encounter for gynecological examination (general) (routine) without abnormal findings: Secondary | ICD-10-CM | POA: Diagnosis not present

## 2017-06-03 DIAGNOSIS — Z131 Encounter for screening for diabetes mellitus: Secondary | ICD-10-CM | POA: Diagnosis not present

## 2017-06-09 ENCOUNTER — Encounter: Payer: BLUE CROSS/BLUE SHIELD | Admitting: Orthotics

## 2017-06-25 ENCOUNTER — Ambulatory Visit: Payer: BLUE CROSS/BLUE SHIELD | Admitting: Podiatry

## 2017-06-25 ENCOUNTER — Ambulatory Visit (INDEPENDENT_AMBULATORY_CARE_PROVIDER_SITE_OTHER): Payer: BLUE CROSS/BLUE SHIELD | Admitting: Orthotics

## 2017-06-25 DIAGNOSIS — M722 Plantar fascial fibromatosis: Secondary | ICD-10-CM

## 2017-06-25 DIAGNOSIS — M216X9 Other acquired deformities of unspecified foot: Secondary | ICD-10-CM

## 2017-06-26 NOTE — Progress Notes (Signed)
Patient came in today to pick up custom made foot orthotics.  The goals were accomplished and the patient reported no dissatisfaction with said orthotics.  Patient was advised of breakin period and how to report any issues. 

## 2017-06-26 NOTE — Progress Notes (Signed)
Subjective: Tenicia presents the office today for follow-up evaluation of bilateral foot pain.  She says her feet are doing much better.  She finished a course of steroids.  She had some mild discomfort at times but overall she is doing better.  She is been stretching, icing as well as using a night splint.  She presents today to pick up orthotics as well.  She has no other concerns today.  No acute changes. Denies any systemic complaints such as fevers, chills, nausea, vomiting. No acute changes since last appointment, and no other complaints at this time.   Objective: AAO x3, NAD DP/PT pulses palpable bilaterally, CRT less than 3 seconds This time there is very minimal tenderness palpation on the plantar medial tubercle of the calcaneus and there is no significant discomfort along the medial band plantar fascia in the arch of the foot.  Plantar fascia appears to be intact.  Achilles tendon appears to be intact.  There is no pain with lateral compression of the calcaneus.  There is no edema, erythema, increase in warmth bilaterally. No open lesions or pre-ulcerative lesions.  No pain with calf compression, swelling, warmth, erythema  Assessment: Resolving bilateral foot pain, plantar fasciitis  Plan: -All treatment options discussed with the patient including all alternatives, risks, complications.  -Continue with stretching, icing exercises daily.  Orthotics were dispensed and appear to be fitting well.  Oral and written break instructions were discussed.  If she has any issues the orthotics discussed with her to call the office.  I will see her back in 4 weeks to see how she is doing she still having symptoms.  In the meantime I encouraged her to call any questions or concerns.  Should no further questions today. -Patient encouraged to call the office with any questions, concerns, change in symptoms.   Trula Slade DPM

## 2017-07-23 ENCOUNTER — Ambulatory Visit: Payer: BLUE CROSS/BLUE SHIELD | Admitting: Podiatry

## 2017-09-09 DIAGNOSIS — D2261 Melanocytic nevi of right upper limb, including shoulder: Secondary | ICD-10-CM | POA: Diagnosis not present

## 2017-09-09 DIAGNOSIS — D225 Melanocytic nevi of trunk: Secondary | ICD-10-CM | POA: Diagnosis not present

## 2017-09-09 DIAGNOSIS — D1800 Hemangioma unspecified site: Secondary | ICD-10-CM | POA: Diagnosis not present

## 2017-09-09 DIAGNOSIS — L814 Other melanin hyperpigmentation: Secondary | ICD-10-CM | POA: Diagnosis not present

## 2017-09-29 DIAGNOSIS — D2230 Melanocytic nevi of unspecified part of face: Secondary | ICD-10-CM | POA: Diagnosis not present

## 2018-12-13 DIAGNOSIS — Z03818 Encounter for observation for suspected exposure to other biological agents ruled out: Secondary | ICD-10-CM | POA: Diagnosis not present

## 2019-01-13 DIAGNOSIS — Z6829 Body mass index (BMI) 29.0-29.9, adult: Secondary | ICD-10-CM | POA: Diagnosis not present

## 2019-01-13 DIAGNOSIS — Z01419 Encounter for gynecological examination (general) (routine) without abnormal findings: Secondary | ICD-10-CM | POA: Diagnosis not present

## 2019-01-13 DIAGNOSIS — R319 Hematuria, unspecified: Secondary | ICD-10-CM | POA: Diagnosis not present

## 2019-02-09 DIAGNOSIS — Z1231 Encounter for screening mammogram for malignant neoplasm of breast: Secondary | ICD-10-CM | POA: Diagnosis not present

## 2019-03-01 DIAGNOSIS — N912 Amenorrhea, unspecified: Secondary | ICD-10-CM | POA: Diagnosis not present

## 2019-03-03 DIAGNOSIS — N912 Amenorrhea, unspecified: Secondary | ICD-10-CM | POA: Diagnosis not present

## 2019-03-14 DIAGNOSIS — O24419 Gestational diabetes mellitus in pregnancy, unspecified control: Secondary | ICD-10-CM | POA: Diagnosis not present

## 2019-03-14 DIAGNOSIS — R7301 Impaired fasting glucose: Secondary | ICD-10-CM | POA: Diagnosis not present

## 2019-03-14 DIAGNOSIS — Z6829 Body mass index (BMI) 29.0-29.9, adult: Secondary | ICD-10-CM | POA: Diagnosis not present

## 2019-03-18 DIAGNOSIS — N911 Secondary amenorrhea: Secondary | ICD-10-CM | POA: Diagnosis not present

## 2019-03-18 DIAGNOSIS — O200 Threatened abortion: Secondary | ICD-10-CM | POA: Diagnosis not present

## 2019-03-25 DIAGNOSIS — N911 Secondary amenorrhea: Secondary | ICD-10-CM | POA: Diagnosis not present

## 2019-03-29 ENCOUNTER — Other Ambulatory Visit (HOSPITAL_COMMUNITY)
Admission: RE | Admit: 2019-03-29 | Discharge: 2019-03-29 | Disposition: A | Discharge status: Home / Self Care | Payer: BC Managed Care – PPO | Source: Ambulatory Visit | Attending: Obstetrics and Gynecology | Admitting: Obstetrics and Gynecology

## 2019-03-29 DIAGNOSIS — Z20828 Contact with and (suspected) exposure to other viral communicable diseases: Secondary | ICD-10-CM | POA: Diagnosis not present

## 2019-03-29 DIAGNOSIS — Z01812 Encounter for preprocedural laboratory examination: Secondary | ICD-10-CM | POA: Insufficient documentation

## 2019-03-29 DIAGNOSIS — O039 Complete or unspecified spontaneous abortion without complication: Secondary | ICD-10-CM | POA: Diagnosis not present

## 2019-03-29 LAB — SARS CORONAVIRUS 2 (TAT 6-24 HRS): SARS Coronavirus 2: NEGATIVE

## 2019-03-30 ENCOUNTER — Other Ambulatory Visit: Payer: Self-pay

## 2019-03-30 ENCOUNTER — Encounter (HOSPITAL_COMMUNITY): Payer: Self-pay | Admitting: *Deleted

## 2019-03-30 MED ORDER — SODIUM CHLORIDE 0.9 % IV SOLN
2.0000 g | INTRAVENOUS | Status: AC
  Administered 2019-03-31: 09:00:00 2 g via INTRAVENOUS
  Filled 2019-03-30: qty 2

## 2019-03-30 NOTE — Progress Notes (Signed)
Patient denies shortness of breath, fever, cough and chest pain.  PCP - [facility] at [place] - Denies  Chest x-ray - Denies EKG - Denies Stress Test - Denies ECHO - Denies Cardiac Cath - Denies  Patient is a gestational diabetic on metformin. Fasting Blood Sugar - 95-100s Checks Blood Sugar __4-5__ times a day   . Do not take oral diabetes medicines (pills) the morning of surgery.(Metformin)  . If your blood sugar is less than 70 mg/dL, you will need to treat for low blood sugar: o Do not take insulin. o Treat a low blood sugar (less than 70 mg/dL) with  cup of clear juice (cranberry or apple), 4 glucose tablets, OR glucose gel. o Recheck blood sugar in 15 minutes after treatment (to make sure it is greater than 70 mg/dL). If your blood sugar is not greater than 70 mg/dL on recheck, call [phone] for further instructions.  Aspirin Instructions: Last Dose 03/30/19.  ERAS: Clears til 6:10 am DOS.  No drink.  Anesthesia review: No  STOP now taking any Aspirin (unless otherwise instructed by your surgeon), Aleve, Naproxen, Ibuprofen, Motrin, Advil, Goody's, BC's, all herbal medications, fish oil, and all vitamins.   Coronavirus Screening Have you or your husband experienced the following symptoms:  Cough yes/no: No Fever (>100.20F)  yes/no: No Runny nose yes/no: No Sore throat yes/no: No Difficulty breathing/shortness of breath  yes/no: No  Have you or your husband traveled in the last 14 days and where? yes/no: No   Patient verbalized understanding of instructions that were given to her via phone.

## 2019-03-30 NOTE — H&P (Signed)
Alexis Craig is an 38 y.o. female. Presents for D&E due to embryonic demise at [redacted] weeks EGA by [place] in office 2 days ago.  She has a hx of repetitive SAB and wants genetic eval of POCs  Pertinent Gynecological History:    Past Medical History:  Diagnosis Date  . Anal fissure 04/09/2009  . ANXIETY 03/09/2008  . Depression   . Gestational diabetes    glyburide  . Headache(784.0) 08/23/2007  . History of alcohol abuse    7 years ago  . Hx of varicella   . INSOMNIA 10/05/2007  . INTENTION TREMOR 06/13/2008  . NEPHROLITHIASIS, HX OF 08/23/2007  . RECTAL BLEEDING 04/09/2009  . RESTLESS LEG SYNDROME 01/22/2009    Past Surgical History:  Procedure Laterality Date  . EXCISIONAL HEMORRHOIDECTOMY      Family History  Problem Relation Age of Onset  . Diabetes Mother   . Thyroid disease Mother   . Cancer Mother   . Heart disease Mother   . Hyperlipidemia Father   . Diabetes Father   . Heart disease Maternal Uncle   . Cancer Maternal Grandmother   . Heart disease Paternal Grandfather     Social History:  reports that she has quit smoking. Her smoking use included cigarettes. She quit smokeless tobacco use about 7 years ago. She reports that she does not drink alcohol or use drugs.  Allergies:  Allergies  Allergen Reactions  . Nsaids Rash    Blisters also  . Ibuprofen     Fixed drug reaction with bullous dermatitis    No medications prior to admission.    ROS  unknown if currently breastfeeding. Physical Exam  Uterus 8 weeks size.  Cx CL/TH [place] 6.5 size fetus.  8 weeks gest sac and dates  No results found for this or any previous visit (from the past 24 hour(s)).  No results found.  Assessment/Plan: Misses Miscarriage at [redacted] weeks EGA Pt desires D&E.  R&B discussed and informed consent obtained. Genetics on POC. DL  Luz Lex 03/30/2019, 4:27 PM

## 2019-03-31 ENCOUNTER — Ambulatory Visit (HOSPITAL_COMMUNITY): Payer: BC Managed Care – PPO | Admitting: Certified Registered Nurse Anesthetist

## 2019-03-31 ENCOUNTER — Encounter (HOSPITAL_COMMUNITY)
Admission: RE | Disposition: A | Discharge status: Home / Self Care | Payer: Self-pay | Source: Home / Self Care | Attending: Obstetrics and Gynecology

## 2019-03-31 ENCOUNTER — Encounter (HOSPITAL_COMMUNITY): Payer: Self-pay

## 2019-03-31 ENCOUNTER — Ambulatory Visit (HOSPITAL_COMMUNITY)
Admission: RE | Admit: 2019-03-31 | Discharge: 2019-03-31 | Disposition: A | Discharge status: Home / Self Care | Payer: BC Managed Care – PPO | Attending: Obstetrics and Gynecology | Admitting: Obstetrics and Gynecology

## 2019-03-31 ENCOUNTER — Other Ambulatory Visit: Payer: Self-pay

## 2019-03-31 DIAGNOSIS — O021 Missed abortion: Secondary | ICD-10-CM | POA: Insufficient documentation

## 2019-03-31 DIAGNOSIS — Z3A08 8 weeks gestation of pregnancy: Secondary | ICD-10-CM | POA: Insufficient documentation

## 2019-03-31 DIAGNOSIS — Z8349 Family history of other endocrine, nutritional and metabolic diseases: Secondary | ICD-10-CM | POA: Insufficient documentation

## 2019-03-31 DIAGNOSIS — O24415 Gestational diabetes mellitus in pregnancy, controlled by oral hypoglycemic drugs: Secondary | ICD-10-CM | POA: Diagnosis not present

## 2019-03-31 DIAGNOSIS — E119 Type 2 diabetes mellitus without complications: Secondary | ICD-10-CM | POA: Diagnosis not present

## 2019-03-31 DIAGNOSIS — F329 Major depressive disorder, single episode, unspecified: Secondary | ICD-10-CM | POA: Diagnosis not present

## 2019-03-31 DIAGNOSIS — Z8249 Family history of ischemic heart disease and other diseases of the circulatory system: Secondary | ICD-10-CM | POA: Insufficient documentation

## 2019-03-31 DIAGNOSIS — R519 Headache, unspecified: Secondary | ICD-10-CM | POA: Diagnosis not present

## 2019-03-31 DIAGNOSIS — Z87442 Personal history of urinary calculi: Secondary | ICD-10-CM | POA: Insufficient documentation

## 2019-03-31 DIAGNOSIS — N96 Recurrent pregnancy loss: Secondary | ICD-10-CM | POA: Diagnosis not present

## 2019-03-31 DIAGNOSIS — G2581 Restless legs syndrome: Secondary | ICD-10-CM | POA: Diagnosis not present

## 2019-03-31 DIAGNOSIS — K219 Gastro-esophageal reflux disease without esophagitis: Secondary | ICD-10-CM | POA: Insufficient documentation

## 2019-03-31 DIAGNOSIS — G4700 Insomnia, unspecified: Secondary | ICD-10-CM | POA: Diagnosis not present

## 2019-03-31 DIAGNOSIS — Z809 Family history of malignant neoplasm, unspecified: Secondary | ICD-10-CM | POA: Diagnosis not present

## 2019-03-31 DIAGNOSIS — Z87891 Personal history of nicotine dependence: Secondary | ICD-10-CM | POA: Insufficient documentation

## 2019-03-31 DIAGNOSIS — Z888 Allergy status to other drugs, medicaments and biological substances status: Secondary | ICD-10-CM | POA: Insufficient documentation

## 2019-03-31 DIAGNOSIS — F419 Anxiety disorder, unspecified: Secondary | ICD-10-CM | POA: Diagnosis not present

## 2019-03-31 DIAGNOSIS — Z833 Family history of diabetes mellitus: Secondary | ICD-10-CM | POA: Insufficient documentation

## 2019-03-31 DIAGNOSIS — Z7984 Long term (current) use of oral hypoglycemic drugs: Secondary | ICD-10-CM | POA: Diagnosis not present

## 2019-03-31 HISTORY — PX: DILATION AND EVACUATION: SHX1459

## 2019-03-31 HISTORY — DX: Gastro-esophageal reflux disease without esophagitis: K21.9

## 2019-03-31 LAB — CBC
HCT: 44.4 % (ref 36.0–46.0)
Hemoglobin: 14.8 g/dL (ref 12.0–15.0)
MCH: 30 pg (ref 26.0–34.0)
MCHC: 33.3 g/dL (ref 30.0–36.0)
MCV: 89.9 fL (ref 80.0–100.0)
Platelets: 311 10*3/uL (ref 150–400)
RBC: 4.94 MIL/uL (ref 3.87–5.11)
RDW: 13.1 % (ref 11.5–15.5)
WBC: 8.3 10*3/uL (ref 4.0–10.5)
nRBC: 0 % (ref 0.0–0.2)

## 2019-03-31 LAB — GLUCOSE, CAPILLARY
Glucose-Capillary: 107 mg/dL — ABNORMAL HIGH (ref 70–99)
Glucose-Capillary: 115 mg/dL — ABNORMAL HIGH (ref 70–99)

## 2019-03-31 LAB — ABO/RH: ABO/RH(D): O POS

## 2019-03-31 LAB — TYPE AND SCREEN
ABO/RH(D): O POS
Antibody Screen: NEGATIVE

## 2019-03-31 SURGERY — DILATION AND EVACUATION, UTERUS
Anesthesia: Monitor Anesthesia Care | Site: Vagina

## 2019-03-31 MED ORDER — LIDOCAINE-EPINEPHRINE 1 %-1:100000 IJ SOLN
INTRAMUSCULAR | Status: AC
  Filled 2019-03-31: qty 1

## 2019-03-31 MED ORDER — FENTANYL CITRATE (PF) 100 MCG/2ML IJ SOLN
INTRAMUSCULAR | Status: DC | PRN
  Administered 2019-03-31 (×2): 50 ug via INTRAVENOUS

## 2019-03-31 MED ORDER — MIDAZOLAM HCL 2 MG/2ML IJ SOLN
INTRAMUSCULAR | Status: AC
  Filled 2019-03-31: qty 2

## 2019-03-31 MED ORDER — PROPOFOL 10 MG/ML IV BOLUS
INTRAVENOUS | Status: AC
  Filled 2019-03-31: qty 20

## 2019-03-31 MED ORDER — LIDOCAINE 2% (20 MG/ML) 5 ML SYRINGE
INTRAMUSCULAR | Status: DC | PRN
  Administered 2019-03-31: 60 mg via INTRAVENOUS

## 2019-03-31 MED ORDER — LIDOCAINE-EPINEPHRINE 1 %-1:100000 IJ SOLN
INTRAMUSCULAR | Status: DC | PRN
  Administered 2019-03-31: 20 mL

## 2019-03-31 MED ORDER — PROMETHAZINE HCL 25 MG/ML IJ SOLN: 6.2500 mg | INTRAMUSCULAR | Status: DC | PRN

## 2019-03-31 MED ORDER — PROPOFOL 10 MG/ML IV BOLUS
INTRAVENOUS | Status: DC | PRN
  Administered 2019-03-31: 30 mg via INTRAVENOUS
  Administered 2019-03-31: 50 mg via INTRAVENOUS
  Administered 2019-03-31 (×2): 20 mg via INTRAVENOUS
  Administered 2019-03-31: 30 mg via INTRAVENOUS

## 2019-03-31 MED ORDER — LACTATED RINGERS IV SOLN
INTRAVENOUS | Status: DC
  Administered 2019-03-31: 07:00:00 via INTRAVENOUS

## 2019-03-31 MED ORDER — FENTANYL CITRATE (PF) 100 MCG/2ML IJ SOLN
INTRAMUSCULAR | Status: AC
  Filled 2019-03-31: qty 2

## 2019-03-31 MED ORDER — METHYLERGONOVINE MALEATE 0.2 MG/ML IJ SOLN
INTRAMUSCULAR | Status: AC
  Filled 2019-03-31: qty 1

## 2019-03-31 MED ORDER — PROPOFOL 500 MG/50ML IV EMUL
INTRAVENOUS | Status: DC | PRN
  Administered 2019-03-31: 100 ug/kg/min via INTRAVENOUS

## 2019-03-31 MED ORDER — ONDANSETRON HCL 4 MG/2ML IJ SOLN
INTRAMUSCULAR | Status: DC | PRN
  Administered 2019-03-31: 4 mg via INTRAVENOUS

## 2019-03-31 MED ORDER — FERRIC SUBSULFATE 259 MG/GM EX SOLN
CUTANEOUS | Status: AC
  Filled 2019-03-31: qty 8

## 2019-03-31 MED ORDER — MIDAZOLAM HCL 5 MG/5ML IJ SOLN
INTRAMUSCULAR | Status: DC | PRN
  Administered 2019-03-31: 2 mg via INTRAVENOUS

## 2019-03-31 MED ORDER — FENTANYL CITRATE (PF) 100 MCG/2ML IJ SOLN
25.0000 ug | INTRAMUSCULAR | Status: DC | PRN
  Administered 2019-03-31: 25 ug via INTRAVENOUS

## 2019-03-31 MED ORDER — ACETAMINOPHEN 500 MG PO TABS: 1000.0000 mg | ORAL_TABLET | Freq: Once | ORAL | Status: DC

## 2019-03-31 MED ORDER — FERRIC SUBSULFATE 259 MG/GM EX SOLN
CUTANEOUS | Status: DC | PRN
  Administered 2019-03-31: 1

## 2019-03-31 MED ORDER — FENTANYL CITRATE (PF) 250 MCG/5ML IJ SOLN
INTRAMUSCULAR | Status: AC
  Filled 2019-03-31: qty 5

## 2019-03-31 SURGICAL SUPPLY — 20 items
CATH ROBINSON RED A/P 16FR (CATHETERS) ×3 IMPLANT
DECANTER SPIKE VIAL GLASS SM (MISCELLANEOUS) ×3 IMPLANT
FILTER UTR ASPR ASSEMBLY (MISCELLANEOUS) ×3 IMPLANT
GLOVE BIOGEL PI IND STRL 7.0 (GLOVE) ×1 IMPLANT
GLOVE BIOGEL PI INDICATOR 7.0 (GLOVE) ×2
GLOVE SURG ORTHO 8.0 STRL STRW (GLOVE) ×3 IMPLANT
GOWN STRL REUS W/ TWL LRG LVL3 (GOWN DISPOSABLE) ×2 IMPLANT
GOWN STRL REUS W/TWL LRG LVL3 (GOWN DISPOSABLE) ×6
HOSE CONNECTING 18IN BERKELEY (TUBING) ×3 IMPLANT
KIT BERKELEY 1ST TRIMESTER 3/8 (MISCELLANEOUS) ×6 IMPLANT
NS IRRIG 1000ML POUR BTL (IV SOLUTION) ×3 IMPLANT
PACK VAGINAL MINOR WOMEN LF (CUSTOM PROCEDURE TRAY) ×3 IMPLANT
PAD OB MATERNITY 4.3X12.25 (PERSONAL CARE ITEMS) ×3 IMPLANT
SET BERKELEY SUCTION TUBING (SUCTIONS) ×3 IMPLANT
TOWEL GREEN STERILE FF (TOWEL DISPOSABLE) ×6 IMPLANT
UNDERPAD 30X30 (UNDERPADS AND DIAPERS) ×3 IMPLANT
VACURETTE 10 RIGID CVD (CANNULA) IMPLANT
VACURETTE 7MM CVD STRL WRAP (CANNULA) IMPLANT
VACURETTE 8 RIGID CVD (CANNULA) ×2 IMPLANT
VACURETTE 9 RIGID CVD (CANNULA) IMPLANT

## 2019-03-31 NOTE — Progress Notes (Signed)
Dr. Gifford Shave notified that patient did not receive 1000mg  tylenol PO prior to going to OR.

## 2019-03-31 NOTE — Transfer of Care (Signed)
Immediate Anesthesia Transfer of Care Note  Patient: Alexis Craig  Procedure(s) Performed: DILATATION AND EVACUATION with genetic studies (N/A Vagina )  Patient Location: PACU  Anesthesia Type:MAC  Level of Consciousness: awake, alert  and oriented  Airway & Oxygen Therapy: Patient Spontanous Breathing  Post-op Assessment: Report given to RN and Post -op Vital signs reviewed and stable  Post vital signs: Reviewed and stable  Last Vitals:  Vitals Value Taken Time  BP 126/72 03/31/19 0947  Temp 36.2 C 03/31/19 0947  Pulse 95 03/31/19 0951  Resp 14 03/31/19 0952  SpO2 100 % 03/31/19 0951  Vitals shown include unvalidated device data.  Last Pain:  Vitals:   03/31/19 0720  TempSrc: Oral  PainSc: 0-No pain      Patients Stated Pain Goal: 2 (27/25/36 6440)  Complications: No apparent anesthesia complications

## 2019-03-31 NOTE — Anesthesia Postprocedure Evaluation (Signed)
Anesthesia Post Note  Patient: Alexis Craig  Procedure(s) Performed: DILATATION AND EVACUATION with genetic studies (N/A Vagina )     Patient location during evaluation: PACU Anesthesia Type: MAC Level of consciousness: awake and alert Pain management: pain level controlled Vital Signs Assessment: post-procedure vital signs reviewed and stable Respiratory status: spontaneous breathing, nonlabored ventilation, respiratory function stable and patient connected to nasal cannula oxygen Cardiovascular status: stable and blood pressure returned to baseline Postop Assessment: no apparent nausea or vomiting Anesthetic complications: no    Last Vitals:  Vitals:   03/31/19 1002 03/31/19 1017  BP: 132/73 125/67  Pulse: 95 94  Resp: 12 13  Temp:    SpO2: 100% 100%    Last Pain:  Vitals:   03/31/19 0947  TempSrc:   PainSc: [place]

## 2019-03-31 NOTE — Anesthesia Preprocedure Evaluation (Addendum)
Anesthesia Evaluation  Patient identified by MRN, date of birth, ID band Patient awake    Reviewed: Allergy & Precautions, NPO status , Patient's Chart, lab work & pertinent test results  Airway Mallampati: II  TM Distance: >3 FB Neck ROM: Full    Dental  (+) Teeth Intact, Dental Advisory Given, Implants,    Pulmonary former smoker,    Pulmonary exam normal breath sounds clear to auscultation       Cardiovascular negative cardio ROS Normal cardiovascular exam Rhythm:Regular Rate:Normal     Neuro/Psych  Headaches, PSYCHIATRIC DISORDERS Anxiety Depression    GI/Hepatic GERD  Medicated,(+)     substance abuse  alcohol use,   Endo/Other  diabetes, Gestational, Oral Hypoglycemic Agents  Renal/GU negative Renal ROS     Musculoskeletal negative musculoskeletal ROS (+)   Abdominal   Peds  Hematology negative hematology ROS (+)   Anesthesia Other Findings Day of surgery medications reviewed with the patient.  Reproductive/Obstetrics Missed abortion                             Anesthesia Physical Anesthesia Plan  ASA: II  Anesthesia Plan: MAC   Post-op Pain Management:    Induction: Intravenous  PONV Risk Score and Plan: 2 and Propofol infusion and Treatment may vary due to age or medical condition  Airway Management Planned: Natural Airway and Nasal Cannula  Additional Equipment:   Intra-op Plan:   Post-operative Plan:   Informed Consent: I have reviewed the patients History and Physical, chart, labs and discussed the procedure including the risks, benefits and alternatives for the proposed anesthesia with the patient or authorized representative who has indicated his/her understanding and acceptance.     Dental advisory given  Plan Discussed with: CRNA  Anesthesia Plan Comments:         Anesthesia Quick Evaluation

## 2019-03-31 NOTE — Op Note (Signed)
NAME: Alexis Craig, Alexis Craig [facility] MEDICAL RECORD [id number] ACCOUNT [id number] DATE OF BIRTH:May 16, 1981 FACILITY: [facility] LOCATION: [facility] PHYSICIAN:Graycee Greeson C. Samuel Rittenhouse, MD  OPERATIVE REPORT  DATE OF PROCEDURE:  03/31/2019  PREOPERATIVE DIAGNOSIS:  Embryonic demise at [redacted] weeks gestational age.  POSTOPERATIVE DIAGNOSIS:  Embryonic demise at [redacted] weeks gestational age.  PROCEDURE:  Dilation and evacuation with genetic studies.  SURGEON:  Louretta Shorten, MD  ANESTHESIA:  Monitored anesthetic care and paracervical block.  INDICATIONS:  The patient is a 38 year old G4 P1 who presented with an embryonic demise confirmed by ultrasound 3 days ago.  She desires definitive surgical interruption and requests genetic studies of the tissue.  Risks and benefits of the procedure  were discussed at length, and informed consent was obtained.  DESCRIPTION OF PROCEDURE:  After adequate anesthesia, the patient was placed in the dorsal lithotomy position.  She was sterilely prepped and draped.  Bladder was sterilely drained.  Graves speculum was placed.  Tenaculum was placed on the anterior lip  of the cervix.  Paracervical block was placed with 1% Xylocaine 1:100,000 epinephrine, a total of 20 mL used.  Uterus was sounded to 10 cm, easily dilated to a #25 Pratt dilator.  An 8 mm suction curette was inserted.  Suction curettage was performed  until a gritty surface was felt throughout the endometrial cavity and no further return of products of conception.  The curette was then removed.  Tenaculum removed from the cervix, noted to be hemostatic.  Did require some Monsel solution.  Graves  speculum was then removed, and the patient was transferred to recovery room in stable condition.  Sponge and needle count was normal x3.  Estimated blood loss was minimal.  The patient will be discharged home to follow up in the office in 2-3 weeks.   Tissue was sent for genetic studies.  LN/NUANCE  D:03/31/2019 T:03/31/2019  JOB:[id number]

## 2019-03-31 NOTE — OR Nursing (Signed)
Anora Fresh genetic testing performed and sent through [facility] per Dr. Corinna Capra

## 2019-03-31 NOTE — Anesthesia Procedure Notes (Signed)
Procedure Name: MAC Date/Time: 03/31/2019 9:19 AM Performed by: Candis Shine, CRNA Pre-anesthesia Checklist: Patient identified, Emergency Drugs available, Suction available and Patient being monitored Patient Re-evaluated:Patient Re-evaluated prior to induction Oxygen Delivery Method: Simple face mask Dental Injury: Teeth and Oropharynx as per pre-operative assessment

## 2019-04-01 ENCOUNTER — Encounter (HOSPITAL_COMMUNITY): Payer: Self-pay | Admitting: Obstetrics and Gynecology

## 2019-04-25 DIAGNOSIS — Z8759 Personal history of other complications of pregnancy, childbirth and the puerperium: Secondary | ICD-10-CM | POA: Diagnosis not present

## 2019-04-25 DIAGNOSIS — Z31430 Encounter of female for testing for genetic disease carrier status for procreative management: Secondary | ICD-10-CM | POA: Diagnosis not present

## 2019-04-25 DIAGNOSIS — Z315 Encounter for genetic counseling: Secondary | ICD-10-CM | POA: Diagnosis not present

## 2019-04-25 DIAGNOSIS — N96 Recurrent pregnancy loss: Secondary | ICD-10-CM | POA: Diagnosis not present

## 2019-04-26 DIAGNOSIS — N96 Recurrent pregnancy loss: Secondary | ICD-10-CM | POA: Diagnosis not present

## 2019-05-13 DIAGNOSIS — E288 Other ovarian dysfunction: Secondary | ICD-10-CM | POA: Diagnosis not present

## 2019-05-13 DIAGNOSIS — N96 Recurrent pregnancy loss: Secondary | ICD-10-CM | POA: Diagnosis not present

## 2019-05-25 DIAGNOSIS — Z3200 Encounter for pregnancy test, result unknown: Secondary | ICD-10-CM | POA: Diagnosis not present

## 2019-05-25 DIAGNOSIS — Z3201 Encounter for pregnancy test, result positive: Secondary | ICD-10-CM | POA: Diagnosis not present

## 2019-05-27 DIAGNOSIS — Z3201 Encounter for pregnancy test, result positive: Secondary | ICD-10-CM | POA: Diagnosis not present

## 2019-05-27 DIAGNOSIS — N96 Recurrent pregnancy loss: Secondary | ICD-10-CM | POA: Diagnosis not present

## 2019-05-27 DIAGNOSIS — Z3200 Encounter for pregnancy test, result unknown: Secondary | ICD-10-CM | POA: Diagnosis not present

## 2019-06-06 DIAGNOSIS — Z3201 Encounter for pregnancy test, result positive: Secondary | ICD-10-CM | POA: Diagnosis not present

## 2019-06-06 DIAGNOSIS — N96 Recurrent pregnancy loss: Secondary | ICD-10-CM | POA: Diagnosis not present

## 2019-06-06 DIAGNOSIS — Z3200 Encounter for pregnancy test, result unknown: Secondary | ICD-10-CM | POA: Diagnosis not present

## 2019-06-06 DIAGNOSIS — N979 Female infertility, unspecified: Secondary | ICD-10-CM | POA: Diagnosis not present

## 2019-06-06 DIAGNOSIS — Z319 Encounter for procreative management, unspecified: Secondary | ICD-10-CM | POA: Diagnosis not present

## 2019-06-06 DIAGNOSIS — N856 Intrauterine synechiae: Secondary | ICD-10-CM | POA: Diagnosis not present

## 2019-06-06 DIAGNOSIS — N8500 Endometrial hyperplasia, unspecified: Secondary | ICD-10-CM | POA: Diagnosis not present

## 2019-06-17 DIAGNOSIS — Z113 Encounter for screening for infections with a predominantly sexual mode of transmission: Secondary | ICD-10-CM | POA: Diagnosis not present

## 2019-06-17 DIAGNOSIS — Z31430 Encounter of female for testing for genetic disease carrier status for procreative management: Secondary | ICD-10-CM | POA: Diagnosis not present

## 2019-06-23 DIAGNOSIS — Z3200 Encounter for pregnancy test, result unknown: Secondary | ICD-10-CM | POA: Diagnosis not present

## 2019-06-23 DIAGNOSIS — N96 Recurrent pregnancy loss: Secondary | ICD-10-CM | POA: Diagnosis not present

## 2019-06-23 DIAGNOSIS — N979 Female infertility, unspecified: Secondary | ICD-10-CM | POA: Diagnosis not present

## 2019-06-23 DIAGNOSIS — Z113 Encounter for screening for infections with a predominantly sexual mode of transmission: Secondary | ICD-10-CM | POA: Diagnosis not present

## 2019-06-23 DIAGNOSIS — N83202 Unspecified ovarian cyst, left side: Secondary | ICD-10-CM | POA: Diagnosis not present

## 2019-07-02 DIAGNOSIS — N979 Female infertility, unspecified: Secondary | ICD-10-CM | POA: Diagnosis not present

## 2019-07-02 DIAGNOSIS — Z113 Encounter for screening for infections with a predominantly sexual mode of transmission: Secondary | ICD-10-CM | POA: Diagnosis not present

## 2019-07-02 DIAGNOSIS — Z3200 Encounter for pregnancy test, result unknown: Secondary | ICD-10-CM | POA: Diagnosis not present

## 2019-07-02 DIAGNOSIS — Z3201 Encounter for pregnancy test, result positive: Secondary | ICD-10-CM | POA: Diagnosis not present

## 2019-07-04 DIAGNOSIS — Z23 Encounter for immunization: Secondary | ICD-10-CM | POA: Diagnosis not present

## 2019-07-08 DIAGNOSIS — N96 Recurrent pregnancy loss: Secondary | ICD-10-CM | POA: Diagnosis not present

## 2019-07-08 DIAGNOSIS — N856 Intrauterine synechiae: Secondary | ICD-10-CM | POA: Diagnosis not present

## 2019-08-01 DIAGNOSIS — Z23 Encounter for immunization: Secondary | ICD-10-CM | POA: Diagnosis not present

## 2019-08-01 DIAGNOSIS — D6859 Other primary thrombophilia: Secondary | ICD-10-CM | POA: Diagnosis not present

## 2019-08-01 DIAGNOSIS — N96 Recurrent pregnancy loss: Secondary | ICD-10-CM | POA: Diagnosis not present

## 2019-08-01 DIAGNOSIS — E2839 Other primary ovarian failure: Secondary | ICD-10-CM | POA: Diagnosis not present

## 2019-08-01 DIAGNOSIS — N979 Female infertility, unspecified: Secondary | ICD-10-CM | POA: Diagnosis not present

## 2019-08-09 DIAGNOSIS — N8500 Endometrial hyperplasia, unspecified: Secondary | ICD-10-CM | POA: Diagnosis not present

## 2019-08-09 DIAGNOSIS — Z3141 Encounter for fertility testing: Secondary | ICD-10-CM | POA: Diagnosis not present

## 2019-08-22 DIAGNOSIS — Z113 Encounter for screening for infections with a predominantly sexual mode of transmission: Secondary | ICD-10-CM | POA: Diagnosis not present

## 2019-09-07 DIAGNOSIS — Z3201 Encounter for pregnancy test, result positive: Secondary | ICD-10-CM | POA: Diagnosis not present

## 2019-09-07 DIAGNOSIS — Z3200 Encounter for pregnancy test, result unknown: Secondary | ICD-10-CM | POA: Diagnosis not present

## 2019-09-09 DIAGNOSIS — Z3200 Encounter for pregnancy test, result unknown: Secondary | ICD-10-CM | POA: Diagnosis not present

## 2019-09-09 DIAGNOSIS — Z3201 Encounter for pregnancy test, result positive: Secondary | ICD-10-CM | POA: Diagnosis not present

## 2019-10-21 DIAGNOSIS — Z3189 Encounter for other procreative management: Secondary | ICD-10-CM | POA: Diagnosis not present

## 2019-10-28 DIAGNOSIS — D6859 Other primary thrombophilia: Secondary | ICD-10-CM | POA: Diagnosis not present

## 2019-10-28 DIAGNOSIS — Z113 Encounter for screening for infections with a predominantly sexual mode of transmission: Secondary | ICD-10-CM | POA: Diagnosis not present

## 2019-10-28 DIAGNOSIS — N979 Female infertility, unspecified: Secondary | ICD-10-CM | POA: Diagnosis not present

## 2019-10-28 DIAGNOSIS — Z3201 Encounter for pregnancy test, result positive: Secondary | ICD-10-CM | POA: Diagnosis not present

## 2019-11-02 DIAGNOSIS — D2261 Melanocytic nevi of right upper limb, including shoulder: Secondary | ICD-10-CM | POA: Diagnosis not present

## 2019-11-02 DIAGNOSIS — D225 Melanocytic nevi of trunk: Secondary | ICD-10-CM | POA: Diagnosis not present

## 2019-11-02 DIAGNOSIS — L821 Other seborrheic keratosis: Secondary | ICD-10-CM | POA: Diagnosis not present

## 2019-11-02 DIAGNOSIS — L814 Other melanin hyperpigmentation: Secondary | ICD-10-CM | POA: Diagnosis not present

## 2019-11-16 DIAGNOSIS — Z3189 Encounter for other procreative management: Secondary | ICD-10-CM | POA: Diagnosis not present

## 2019-12-12 DIAGNOSIS — Z3189 Encounter for other procreative management: Secondary | ICD-10-CM | POA: Diagnosis not present

## 2020-01-06 DIAGNOSIS — Z3189 Encounter for other procreative management: Secondary | ICD-10-CM | POA: Diagnosis not present

## 2020-01-13 ENCOUNTER — Encounter: Payer: Self-pay | Admitting: Plastic Surgery

## 2020-01-13 ENCOUNTER — Other Ambulatory Visit: Payer: Self-pay

## 2020-01-13 ENCOUNTER — Ambulatory Visit: Payer: BC Managed Care – PPO | Admitting: Plastic Surgery

## 2020-01-13 DIAGNOSIS — L989 Disorder of the skin and subcutaneous tissue, unspecified: Secondary | ICD-10-CM | POA: Insufficient documentation

## 2020-01-13 NOTE — Progress Notes (Signed)
Patient ID: Alexis Craig, female    DOB: [birth year], 39 y.o.   MRN: [id number]   Chief Complaint  Patient presents with   Consult   Skin Problem    Patient is a 39 year old female here for evaluation of her face.  She has had some precancerous skin lesions in the past and has a family history of melanoma.  She has 2 lesions on her face that she is concerned about.  They have been getting larger and seem to be getting darker in time.  She is interested in having them removed and examined pathologically.  I think this is reasonable.  She is otherwise in good health.  She is not a and she is not on blood thinners.  She is on Metformin.  Both lesions have a slight hyperpigmentation and are 7 to 9 mm in size.   Review of Systems  Constitutional: Negative.   HENT: Negative.   Eyes: Negative.   Respiratory: Negative.   Genitourinary: Negative.   Musculoskeletal: Negative.   Skin: Positive for color change. Negative for wound.  Psychiatric/Behavioral: Negative.     Past Medical History:  Diagnosis Date   Anal fissure 04/09/2009   ANXIETY 03/09/2008   Depression    GERD (gastroesophageal reflux disease)    Gestational diabetes    metformin   History of alcohol abuse    7 years ago   Hx of varicella    INSOMNIA 10/05/2007   INTENTION TREMOR 06/13/2008   NEPHROLITHIASIS, HX OF 08/23/2007   passed stone, no surgery   RECTAL BLEEDING 04/09/2009   RESTLESS LEG SYNDROME 01/22/2009    Past Surgical History:  Procedure Laterality Date   DILATION AND EVACUATION N/A 03/31/2019   Procedure: DILATATION AND EVACUATION with genetic studies;  Surgeon: Louretta Shorten, MD;  Location: [facility];  Service: Gynecology;  Laterality: N/A;  with genetic studies   EXCISIONAL HEMORRHOIDECTOMY     WISDOM TOOTH EXTRACTION        Current Outpatient Medications:    acetaminophen (TYLENOL) 500 MG tablet, Take 500 mg by mouth every 6 (six) hours as needed for moderate pain. , Disp: , Rfl:     cholecalciferol (VITAMIN D3) 25 MCG (1000 UT) tablet, Take 1,000 Units by mouth daily., Disp: , Rfl:    metFORMIN (GLUCOPHAGE-XR) 750 MG 24 hr tablet, Take 750 mg by mouth 2 (two) times daily., Disp: , Rfl:    omeprazole (PRILOSEC OTC) 20 MG tablet, Take 20 mg by mouth daily., Disp: , Rfl:    Prenatal Vit-Fe Fumarate-FA (PRENATAL MULTIVITAMIN) TABS tablet, Take 1 tablet by mouth daily at 12 noon., Disp: , Rfl:    Objective:   Vitals:   01/13/20 0907  BP: 125/86  Pulse: 80  Temp: 99.1 F (37.3 C)  SpO2: 97%    Physical Exam Vitals and nursing note reviewed.  Constitutional:      Appearance: Normal appearance.  HENT:     Head: Normocephalic and atraumatic.   Cardiovascular:     Rate and Rhythm: Normal rate.     Pulses: Normal pulses.  Pulmonary:     Effort: Pulmonary effort is normal.  Abdominal:     General: Abdomen is flat. There is no distension.  Neurological:     General: No focal deficit present.     Mental Status: She is alert and oriented to person, place, and time.  Psychiatric:        Mood and Affect: Mood normal.  Behavior: Behavior normal.        Thought Content: Thought content normal.     Assessment & Plan:  Changing skin lesion  Plan on excision of 2 changing skin lesions of her left cheek with pathology evaluation.  I have explained that she will have scars and she says she is okay with a straight line looking scar.  Pictures were obtained of the patient and placed in the chart with the patient's or guardian's permission.   [place], DO

## 2020-03-01 DIAGNOSIS — Z3189 Encounter for other procreative management: Secondary | ICD-10-CM | POA: Diagnosis not present

## 2020-03-01 DIAGNOSIS — Z01419 Encounter for gynecological examination (general) (routine) without abnormal findings: Secondary | ICD-10-CM | POA: Diagnosis not present

## 2020-03-01 DIAGNOSIS — Z6827 Body mass index (BMI) 27.0-27.9, adult: Secondary | ICD-10-CM | POA: Diagnosis not present

## 2020-03-01 DIAGNOSIS — Z1231 Encounter for screening mammogram for malignant neoplasm of breast: Secondary | ICD-10-CM | POA: Diagnosis not present

## 2020-03-01 DIAGNOSIS — R319 Hematuria, unspecified: Secondary | ICD-10-CM | POA: Diagnosis not present

## 2020-03-13 ENCOUNTER — Encounter: Payer: Self-pay | Admitting: Plastic Surgery

## 2020-03-13 ENCOUNTER — Other Ambulatory Visit: Payer: Self-pay

## 2020-03-13 ENCOUNTER — Other Ambulatory Visit (HOSPITAL_COMMUNITY)
Admission: RE | Admit: 2020-03-13 | Discharge: 2020-03-13 | Disposition: A | Discharge status: Home / Self Care | Payer: BC Managed Care – PPO | Source: Ambulatory Visit | Attending: Plastic Surgery | Admitting: Plastic Surgery

## 2020-03-13 ENCOUNTER — Ambulatory Visit (INDEPENDENT_AMBULATORY_CARE_PROVIDER_SITE_OTHER): Payer: BC Managed Care – PPO | Admitting: Plastic Surgery

## 2020-03-13 VITALS — BP 125/76 | HR 83 | Temp 98.3°F

## 2020-03-13 DIAGNOSIS — L989 Disorder of the skin and subcutaneous tissue, unspecified: Secondary | ICD-10-CM | POA: Diagnosis not present

## 2020-03-13 DIAGNOSIS — D2239 Melanocytic nevi of other parts of face: Secondary | ICD-10-CM | POA: Diagnosis not present

## 2020-03-13 NOTE — Progress Notes (Signed)
Procedure Note  Preoperative Dx: Left cheek 1.  Changing skin lesion medial 5 mm 2.  Changing skin lesion lateral 6 mm  Postoperative Dx: Same  Procedure:  1.  Excision of changing skin lesion medial left cheek 5 mm 2.  Excision of changing skin lesion lateral left cheek 6 mm  Anesthesia: Lidocaine 1% with 1:100,000 epinepherine  Indication for Procedure: changing skin lesion  Description of Procedure: Risks and complications were explained to the patient.  Consent was confirmed and the patient understands the risks and benefits.  The potential complications and alternatives were explained and the patient consents.  The patient expressed understanding the option of not having the procedure and the risks of a scar.  Time out was called and all information was confirmed to be correct.    Medial left cheek:  The area was prepped and drapped.  Lidocaine 1% with epinepherine was injected in the subcutaneous area.  After waiting several minutes for the local to take affect a #15 blade was used to excise the area in an eliptical pattern.  A 5-0 Monocryl was used to close the skin edges.  A dressing was applied.    Lateral left cheek the area was prepped and drapped.  Lidocaine 1% with epinepherine was injected in the subcutaneous area.  After waiting several minutes for the local to take affect a #15 blade was used to excise the area in an eliptical pattern.  A 5-0 Monocryl was used to close the skin edges.  A dressing was applied.  The patient was given instructions on how to care for the area and a follow up appointment.  Alexis Craig tolerated the procedure well and there were no complications. The specimen was marked long stitch lateral and short superior then sent to pathology with the biopsy report from the referring physician.

## 2020-03-16 LAB — SURGICAL PATHOLOGY

## 2020-03-23 ENCOUNTER — Encounter: Payer: Self-pay | Admitting: Plastic Surgery

## 2020-03-23 ENCOUNTER — Ambulatory Visit (INDEPENDENT_AMBULATORY_CARE_PROVIDER_SITE_OTHER): Payer: BC Managed Care – PPO | Admitting: Plastic Surgery

## 2020-03-23 ENCOUNTER — Other Ambulatory Visit: Payer: Self-pay

## 2020-03-23 VITALS — HR 85 | Temp 98.9°F

## 2020-03-23 DIAGNOSIS — L989 Disorder of the skin and subcutaneous tissue, unspecified: Secondary | ICD-10-CM

## 2020-03-23 NOTE — Progress Notes (Signed)
Patient is a 39 year old female here for follow-up on 2 skin lesion excisions.  It were compound nevi and they have some margins that are close or involved but no atypia.  The areas are healing well.  Sutures were removed.  Recommend continuing with the Steri-Strip for another week or 2.  She can then start [place] or skinuva.  Avoid sun exposure possible.  Follow-up as needed.

## 2020-03-30 DIAGNOSIS — Z319 Encounter for procreative management, unspecified: Secondary | ICD-10-CM | POA: Diagnosis not present

## 2020-04-04 DIAGNOSIS — R309 Painful micturition, unspecified: Secondary | ICD-10-CM | POA: Diagnosis not present

## 2020-04-04 DIAGNOSIS — M545 Low back pain, unspecified: Secondary | ICD-10-CM | POA: Diagnosis not present

## 2020-04-09 ENCOUNTER — Encounter: Payer: Self-pay | Admitting: Family Medicine

## 2020-04-09 ENCOUNTER — Ambulatory Visit: Payer: BC Managed Care – PPO | Admitting: Family Medicine

## 2020-04-09 ENCOUNTER — Other Ambulatory Visit: Payer: Self-pay

## 2020-04-09 VITALS — BP 128/74 | HR 87 | Temp 98.2°F | Ht 63.0 in | Wt 169.2 lb

## 2020-04-09 DIAGNOSIS — R300 Dysuria: Secondary | ICD-10-CM

## 2020-04-09 DIAGNOSIS — R319 Hematuria, unspecified: Secondary | ICD-10-CM | POA: Diagnosis not present

## 2020-04-09 LAB — POCT URINALYSIS DIPSTICK
Bilirubin, UA: NEGATIVE
Glucose, UA: NEGATIVE
Ketones, UA: NEGATIVE
Nitrite, UA: NEGATIVE
Protein, UA: POSITIVE — AB
Spec Grav, UA: 1.02 (ref 1.010–1.025)
Urobilinogen, UA: 0.2 E.U./dL
pH, UA: 6 (ref 5.0–8.0)

## 2020-04-09 MED ORDER — CIPROFLOXACIN HCL 500 MG PO TABS: 500.0000 mg | ORAL_TABLET | Freq: Two times a day (BID) | ORAL | 0 refills | Status: DC

## 2020-04-09 MED ORDER — PHENAZOPYRIDINE HCL 200 MG PO TABS: 200.0000 mg | ORAL_TABLET | Freq: Three times a day (TID) | ORAL | 0 refills | Status: DC | PRN

## 2020-04-09 NOTE — Addendum Note (Signed)
Addended by: Matilde Sprang on: 04/09/2020 03:01 PM   Modules accepted: Orders

## 2020-04-09 NOTE — Progress Notes (Signed)
   Subjective:    Patient ID: Alexis Craig, female    DOB: 1980-10-01, 39 y.o.   MRN: [id number]  HPI Here for recurrent UTI symptoms. She was found to have blood in her urine last month during a routine exam at her GYN office (Dr. Corinna Capra). She had no symptoms at that time, but a urine sample was sent off for a culture which came back negative. Then a week later she developed burning and urgency on urination, so she was given a week of Macrobid. This did not help the burning, so she was then started on Bactrim and a second urine was sent for culture. This again came back as negative, so she was told to stop the Bactrim (she ended up taking it for 3 days). The symptoms eased up some but never stopped, and for the past few days the burning has gotten worse. No back pain or fever. She has slight nausea off and on. BMs are regular. No vaginal DC. She is currently undergoing infertility treatments, and a recent transvaginal US was normal. Her LMP was 2 weeks ago, and she began ovulating 4 days ago (she is following her temperatures). She has a hx of gestational diabetes, and her am fasting glucoses now average 105-110.    Review of Systems  Constitutional: Negative.   Respiratory: Negative.   Cardiovascular: Negative.   Gastrointestinal: Positive for nausea. Negative for abdominal distention, abdominal pain, anal bleeding, blood in stool, constipation, diarrhea and rectal pain.  Genitourinary: Positive for dysuria and urgency. Negative for flank pain, frequency and hematuria.       Objective:   Physical Exam Constitutional:      Appearance: Normal appearance. She is not ill-appearing.  Cardiovascular:     Rate and Rhythm: Normal rate and regular rhythm.     Pulses: Normal pulses.     Heart sounds: Normal heart sounds.  Pulmonary:     Effort: Pulmonary effort is normal.     Breath sounds: Normal breath sounds.  Abdominal:     General: Abdomen is flat. Bowel sounds are normal. There is no  distension.     Palpations: Abdomen is soft. There is no mass.     Tenderness: There is no right CVA tenderness, left CVA tenderness, guarding or rebound.     Hernia: No hernia is present.     Comments: Mildly tender across the lower abdomen   Neurological:     Mental Status: She is alert.           Assessment & Plan:  Dysuria, likely due to a UTI. We will treat with Cipro and send off another culture. If this returns as negative, we may refer her to Urology for suspected interstitial cystitis.  Alysia Penna, MD

## 2020-04-11 LAB — URINE CULTURE
MICRO NUMBER:: 11056978
SPECIMEN QUALITY:: ADEQUATE

## 2020-04-13 ENCOUNTER — Telehealth: Payer: Self-pay | Admitting: General Practice

## 2020-04-13 MED ORDER — SULFAMETHOXAZOLE-TRIMETHOPRIM 800-160 MG PO TABS: 1.0000 | ORAL_TABLET | Freq: Two times a day (BID) | ORAL | 0 refills | Status: DC

## 2020-04-13 NOTE — Telephone Encounter (Signed)
Patient is requesting a call back because she feels like her UTI is getting worse and not better.  Pt is on Day 5 of antibiotic and is still in pain in bladder and still burning while urinating.

## 2020-04-13 NOTE — Telephone Encounter (Signed)
Patient called and advised Bactrim DS sent to [facility] and to stop Cipro as per Dr. Sarajane Jews in result note 04/13/20. She verbalized understanding.

## 2020-05-17 ENCOUNTER — Telehealth (INDEPENDENT_AMBULATORY_CARE_PROVIDER_SITE_OTHER): Payer: BC Managed Care – PPO | Admitting: Family Medicine

## 2020-05-17 ENCOUNTER — Telehealth: Payer: BC Managed Care – PPO | Admitting: Family Medicine

## 2020-05-17 ENCOUNTER — Other Ambulatory Visit: Payer: Self-pay

## 2020-05-17 DIAGNOSIS — R310 Gross hematuria: Secondary | ICD-10-CM

## 2020-05-17 DIAGNOSIS — R300 Dysuria: Secondary | ICD-10-CM | POA: Diagnosis not present

## 2020-05-17 LAB — POCT URINALYSIS DIPSTICK
Bilirubin, UA: NEGATIVE
Blood, UA: NEGATIVE
Glucose, UA: NEGATIVE
Ketones, UA: NEGATIVE
Leukocytes, UA: NEGATIVE
Nitrite, UA: POSITIVE
Protein, UA: POSITIVE — AB
Spec Grav, UA: 1.025 (ref 1.010–1.025)
Urobilinogen, UA: 1 E.U./dL
pH, UA: 6 (ref 5.0–8.0)

## 2020-05-17 MED ORDER — PHENAZOPYRIDINE HCL 200 MG PO TABS: 200.0000 mg | ORAL_TABLET | Freq: Three times a day (TID) | ORAL | 0 refills | Status: DC | PRN

## 2020-05-17 NOTE — Progress Notes (Signed)
Virtual Visit via Video Note  I connected with Zayden  on 05/17/20 at  3:40 PM EST by a video enabled telemedicine application and verified that I am speaking with the correct person using two identifiers.  Location patient: home,  Location provider:work or home office Persons participating in the virtual visit: patient, provider  I discussed the limitations of evaluation and management by telemedicine and the availability of in person appointments. The patient expressed understanding and agreed to proceed.   HPI:  Acute telemedicine visit for : -Onset: about 8 weeks ago -has seen ob and was seen by Dr. Sarajane Jews -has taken several courses of antibiotic including macrobid and cipro (though per report most cultures were negative, did have < 50,000 CFU) and was treated with Bactrim again but symptoms have not resolved -Symptoms include: pain with urination over bladder area, some low back pain on the R, reports there was blood in her urine initially and saw blood in her urine again with her menstrual cycle 5 days ago -Denies: fever, NVD, vaginal symptoms, abd pain -FDLMP 5 days ago -Has tried:they only thing that has helped I the Pyridium that Dr. Sarajane Jews sent and she requests a refill -she wants to see a urologist asap  ROS: See pertinent positives and negatives per HPI.  Past Medical History:  Diagnosis Date  . Anal fissure 04/09/2009  . ANXIETY 03/09/2008  . Depression   . GERD (gastroesophageal reflux disease)   . Gestational diabetes    metformin  . History of alcohol abuse    7 years ago  . Hx of varicella   . INSOMNIA 10/05/2007  . INTENTION TREMOR 06/13/2008  . NEPHROLITHIASIS, HX OF 08/23/2007   passed stone, no surgery  . RECTAL BLEEDING 04/09/2009  . RESTLESS LEG SYNDROME 01/22/2009    Past Surgical History:  Procedure Laterality Date  . DILATION AND EVACUATION N/A 03/31/2019   Procedure: DILATATION AND EVACUATION with genetic studies;  Surgeon: Louretta Shorten, MD;  Location:  [facility];  Service: Gynecology;  Laterality: N/A;  with genetic studies  . EXCISIONAL HEMORRHOIDECTOMY    . WISDOM TOOTH EXTRACTION       Current Outpatient Medications:  .  acetaminophen (TYLENOL) 500 MG tablet, Take 500 mg by mouth every 6 (six) hours as needed for moderate pain. , Disp: , Rfl:  .  cholecalciferol (VITAMIN D3) 25 MCG (1000 UT) tablet, Take 1,000 Units by mouth daily., Disp: , Rfl:  .  metFORMIN (GLUCOPHAGE-XR) 750 MG 24 hr tablet, Take 750 mg by mouth 2 (two) times daily., Disp: , Rfl:  .  omeprazole (PRILOSEC OTC) 20 MG tablet, Take 20 mg by mouth daily., Disp: , Rfl:  .  phenazopyridine (PYRIDIUM) 200 MG tablet, Take 1 tablet (200 mg total) by mouth 3 (three) times daily as needed for pain., Disp: 30 tablet, Rfl: 0 .  Prenatal Vit-Fe Fumarate-FA (PRENATAL MULTIVITAMIN) TABS tablet, Take 1 tablet by mouth daily at 12 noon., Disp: , Rfl:   EXAM:  VITALS per patient if applicable:  GENERAL: alert, oriented, appears well and in no acute distress  HEENT: atraumatic, conjunttiva clear, no obvious abnormalities on inspection of external nose and ears  NECK: normal movements of the head and neck  LUNGS: on inspection no signs of respiratory distress, breathing rate appears normal, no obvious gross SOB, gasping or wheezing  CV: no obvious cyanosis  MS: moves all visible extremities without noticeable abnormality  PSYCH/NEURO: pleasant and cooperative, no obvious depression or anxiety, speech and thought processing grossly  intact  ASSESSMENT AND PLAN:  Discussed the following assessment and plan:  Gross hematuria - Plan: Ambulatory referral to Urology  Dysuria - Plan: Ambulatory referral to Urology  -we discussed possible serious and likely etiologies, options for evaluation and workup, limitations of telemedicine visit vs in person visit, treatment, treatment risks and precautions.  Advised urology evaluation for her concerning doing symptoms for several months.  She  reports she contacted the [facility] urology office and they do not have openings for 2 months, she is quite worried about her symptoms and wants to see urology ASAP.  I sent an urgent referral through her PCP's office.  She agrees to call the office on Monday to check on the status of this referral if she has not heard from the urology office by then.   Also, on her request, sent a refill of the Pyridium for her symptoms and reached out to PCP office to see if she can do repeat urine studies there, as she dropped off a urine there today.  Sent epic message to the nurse who reached out to me that she had dropped off urine.  I did recommend prompt in person care if any worsening, new symptoms arise, or if is not improving while waiting on the urology appointment. Discussed options for inperson care if future PCP office not available. Did let this patient know that I only do telemedicine on Tuesdays and Thursdays for [place].  I did let her know that there is a the days that I will be able to check results.  She was okay with this.  Advised to schedule follow up visit with PCP or [facility] if any further questions or concerns to avoid delays in care.   I discussed the assessment and treatment plan with the patient.  Spent over 30 minutes on this appointment.  The patient was provided an opportunity to ask questions and all were answered. The patient agreed with the plan and demonstrated an understanding of the instructions.     Lucretia Kern, DO

## 2020-05-17 NOTE — Patient Instructions (Addendum)
I sent an urgent referral to urology through your primary care office referral system.  Please contact your primary care office on Monday to check on the status of that referral if you have not heard from the urology office before then.  I also reached out to your primary care office to see if they could check your urine.  I will be back on shift next Tuesday and will be able to check on those results then.  If you have any questions or wish to check on those results sooner, please contact the office to see if they can check on them for you.  I sent in the medication that you requested.  If you start to feel any worse, develop new symptoms or you are not able to get in with the urologist promptly in the next week or so, please seek in person care with your primary care office or at an urgent care (or emergency room if severe symptoms.)   I hope you are feeling better soon.  It was nice to meet you today. I help [facility] out with telemedicine visits on Tuesdays and Thursdays and am available for visits on those days. If you have any concerns or questions following this visit please schedule a follow up visit with your Primary Care doctor or seek care at a local urgent care clinic to avoid delays in care.

## 2020-05-19 LAB — URINE CULTURE
MICRO NUMBER:: 11221185
SPECIMEN QUALITY:: ADEQUATE

## 2020-06-12 ENCOUNTER — Other Ambulatory Visit: Payer: Self-pay

## 2020-06-12 ENCOUNTER — Encounter: Payer: Self-pay | Admitting: Internal Medicine

## 2020-06-12 ENCOUNTER — Ambulatory Visit: Payer: BC Managed Care – PPO | Admitting: Internal Medicine

## 2020-06-12 VITALS — BP 130/90 | HR 94 | Temp 98.0°F | Ht 63.0 in | Wt 170.0 lb

## 2020-06-12 DIAGNOSIS — N029 Recurrent and persistent hematuria with unspecified morphologic changes: Secondary | ICD-10-CM

## 2020-06-12 DIAGNOSIS — Z3490 Encounter for supervision of normal pregnancy, unspecified, unspecified trimester: Secondary | ICD-10-CM

## 2020-06-12 DIAGNOSIS — R300 Dysuria: Secondary | ICD-10-CM

## 2020-06-12 LAB — POCT URINALYSIS DIPSTICK
Bilirubin, UA: NEGATIVE
Glucose, UA: NEGATIVE
Ketones, UA: NEGATIVE
Nitrite, UA: NEGATIVE
Protein, UA: POSITIVE — AB
Spec Grav, UA: 1.015 (ref 1.010–1.025)
Urobilinogen, UA: 0.2 E.U./dL
pH, UA: 7 (ref 5.0–8.0)

## 2020-06-12 MED ORDER — NITROFURANTOIN MONOHYD MACRO 100 MG PO CAPS: 100.0000 mg | ORAL_CAPSULE | Freq: Two times a day (BID) | ORAL | 0 refills | Status: AC

## 2020-06-12 NOTE — Progress Notes (Signed)
Established Patient Office Visit     This visit occurred during the SARS-CoV-2 public health emergency.  Safety protocols were in place, including screening questions prior to the visit, additional usage of staff PPE, and extensive cleaning of exam room while observing appropriate contact time as indicated for disinfecting solutions.    CC/Reason for Visit: Establish care, discuss hematuria  HPI: Alexis Craig is a 39 y.o. female who is coming in today for the above mentioned reasons.  She has no past medical history of significance.  She has had a longstanding issue with infertility.  Since September she has been dealing with recurrent UTIs and hematuria.  This was first diagnosed by her OB in September, it appears she completed a course of Macrobid.  She has been seen by 2 colleagues here in the office and treated with Cipro and Bactrim.  Cultures have grown coag negative staph and staph epidermidis.  She continues to have hematuria.  She had a kidney ultrasound that was negative.  She tells me that yesterday she had a positive pregnancy test.  A couple weeks ago she had severe right-sided flank pain that subsided.  She has a prior history of nephrolithiasis over 20 years ago.  It appears she needed stone extraction from describes.   Past Medical/Surgical History: Past Medical History:  Diagnosis Date  . Anal fissure 04/09/2009  . ANXIETY 03/09/2008  . Depression   . GERD (gastroesophageal reflux disease)   . Gestational diabetes    metformin  . History of alcohol abuse    7 years ago  . Hx of varicella   . INSOMNIA 10/05/2007  . INTENTION TREMOR 06/13/2008  . NEPHROLITHIASIS, HX OF 08/23/2007   passed stone, no surgery  . RECTAL BLEEDING 04/09/2009  . RESTLESS LEG SYNDROME 01/22/2009    Past Surgical History:  Procedure Laterality Date  . DILATION AND EVACUATION N/A 03/31/2019   Procedure: DILATATION AND EVACUATION with genetic studies;  Surgeon: Louretta Shorten, MD;   Location: [facility];  Service: Gynecology;  Laterality: N/A;  with genetic studies  . EXCISIONAL HEMORRHOIDECTOMY    . WISDOM TOOTH EXTRACTION      Social History:  reports that she quit smoking about 8 years ago. Her smoking use included cigarettes. She has a 6.00 pack-year smoking history. She has never used smokeless tobacco. She reports that she does not drink alcohol and does not use drugs.  Allergies: Allergies  Allergen Reactions  . Nsaids Rash    Blisters also  . Ibuprofen     Fixed drug reaction with bullous dermatitis    Family History:  Family History  Problem Relation Age of Onset  . Diabetes Mother   . Thyroid disease Mother   . Cancer Mother   . Heart disease Mother   . Hyperlipidemia Father   . Diabetes Father   . Heart disease Maternal Uncle   . Cancer Maternal Grandmother   . Heart disease Paternal Grandfather      Current Outpatient Medications:  .  acetaminophen (TYLENOL) 500 MG tablet, Take 500 mg by mouth every 6 (six) hours as needed for moderate pain. , Disp: , Rfl:  .  cholecalciferol (VITAMIN D3) 25 MCG (1000 UT) tablet, Take 1,000 Units by mouth daily., Disp: , Rfl:  .  metFORMIN (GLUCOPHAGE-XR) 750 MG 24 hr tablet, Take 750 mg by mouth 2 (two) times daily., Disp: , Rfl:  .  omeprazole (PRILOSEC OTC) 20 MG tablet, Take 20 mg by mouth daily., Disp: ,  Rfl:  .  Prenatal Vit-Fe Fumarate-FA (PRENATAL MULTIVITAMIN) TABS tablet, Take 1 tablet by mouth daily at 12 noon., Disp: , Rfl:  .  nitrofurantoin, macrocrystal-monohydrate, (MACROBID) 100 MG capsule, Take 1 capsule (100 mg total) by mouth 2 (two) times daily for 7 days., Disp: 14 capsule, Rfl: 0  Review of Systems:  Constitutional: Denies fever, chills, diaphoresis, appetite change and fatigue.  HEENT: Denies photophobia, eye pain, redness, hearing loss, ear pain, congestion, sore throat, rhinorrhea, sneezing, mouth sores, trouble swallowing, neck pain, neck stiffness and tinnitus.   Respiratory: Denies  SOB, DOE, cough, chest tightness,  and wheezing.   Cardiovascular: Denies chest pain, palpitations and leg swelling.  Gastrointestinal: Denies nausea, vomiting, abdominal pain, diarrhea, constipation, blood in stool and abdominal distention.  Genitourinary: Denies dysuria, urgency, frequency, difficulty urinating.  Endocrine: Denies: hot or cold intolerance, sweats, changes in hair or nails, polyuria, polydipsia. Musculoskeletal: Denies myalgias, back pain, joint swelling, arthralgias and gait problem.  Skin: Denies pallor, rash and wound.  Neurological: Denies dizziness, seizures, syncope, weakness, light-headedness, numbness and headaches.  Hematological: Denies adenopathy. Easy bruising, personal or family bleeding history  Psychiatric/Behavioral: Denies suicidal ideation, mood changes, confusion, nervousness, sleep disturbance and agitation    Physical Exam: Vitals:   06/12/20 0907  BP: 130/90  Pulse: 94  Temp: 98 F (36.7 C)  TempSrc: Oral  SpO2: 99%  Weight: 170 lb (77.1 kg)  Height: 5\' 3"  (1.6 m)    Body mass index is 30.11 kg/m.   Constitutional: NAD, calm, comfortable Eyes: PERRL, lids and conjunctivae normal ENMT: Mucous membranes are moist.  Respiratory: clear to auscultation bilaterally, no wheezing, no crackles. Normal respiratory effort. No accessory muscle use.  Cardiovascular: Regular rate and rhythm, no murmurs / rubs / gallops. No extremity edema. 2+ pedal pulses.   Neurologic: Grossly intact and nonfocal. Psychiatric: Normal judgment and insight. Alert and oriented x 3. Normal mood.    Impression and Plan:  Recurrent hematuria Early stage of pregnancy  -At this point I believe her recurrent hematuria needs further work-up.  Ideally I would start with a CT scan, however with the possibility of early pregnancy I will hold off on this.  She already has an appointment scheduled in early January with urology.  Urine dipstick in office today does have 3+ blood  and positive leukocytes.  I will go ahead and start her on a course of Macrobid, cultures have been sent.  If pregnancy does progress, I recommend further work-up by urology with possibly a cystoscopy, may be repeat ultrasound.    Patient Instructions  -Nice seeing you today!!  -Start macrobid 100 mg twice daily for 7 days.  -Make sure you see your urologist as scheduled in January.  -Let me know if/when safe to order CT scan.     Lelon Frohlich, MD [facility] at [facility]

## 2020-06-12 NOTE — Patient Instructions (Signed)
-  Nice seeing you today!!  -Start macrobid 100 mg twice daily for 7 days.  -Make sure you see your urologist as scheduled in January.  -Let me know if/when safe to order CT scan.

## 2020-06-13 LAB — URINALYSIS
Bilirubin Urine: NEGATIVE
Glucose, UA: NEGATIVE
Ketones, ur: NEGATIVE
Nitrite: NEGATIVE
Specific Gravity, Urine: 1.01 (ref 1.001–1.03)
pH: 7 (ref 5.0–8.0)

## 2020-06-13 LAB — URINE CULTURE
MICRO NUMBER:: 11315298
SPECIMEN QUALITY:: ADEQUATE

## 2020-06-14 ENCOUNTER — Encounter: Payer: Self-pay | Admitting: Internal Medicine

## 2020-06-15 ENCOUNTER — Other Ambulatory Visit: Payer: Self-pay | Admitting: Internal Medicine

## 2020-06-15 DIAGNOSIS — R109 Unspecified abdominal pain: Secondary | ICD-10-CM

## 2020-07-06 DIAGNOSIS — R8271 Bacteriuria: Secondary | ICD-10-CM | POA: Diagnosis not present

## 2020-07-06 DIAGNOSIS — R310 Gross hematuria: Secondary | ICD-10-CM | POA: Diagnosis not present

## 2020-07-23 DIAGNOSIS — K5750 Diverticulosis of both small and large intestine without perforation or abscess without bleeding: Secondary | ICD-10-CM | POA: Diagnosis not present

## 2020-07-23 DIAGNOSIS — N132 Hydronephrosis with renal and ureteral calculous obstruction: Secondary | ICD-10-CM | POA: Diagnosis not present

## 2020-07-23 DIAGNOSIS — R634 Abnormal weight loss: Secondary | ICD-10-CM | POA: Diagnosis not present

## 2020-07-23 DIAGNOSIS — R310 Gross hematuria: Secondary | ICD-10-CM | POA: Diagnosis not present

## 2020-07-23 DIAGNOSIS — N281 Cyst of kidney, acquired: Secondary | ICD-10-CM | POA: Diagnosis not present

## 2020-07-31 HISTORY — PX: KIDNEY STONE SURGERY: SHX686

## 2020-08-02 DIAGNOSIS — N202 Calculus of kidney with calculus of ureter: Secondary | ICD-10-CM | POA: Diagnosis not present

## 2020-08-24 DIAGNOSIS — N201 Calculus of ureter: Secondary | ICD-10-CM | POA: Diagnosis not present

## 2020-08-24 DIAGNOSIS — N202 Calculus of kidney with calculus of ureter: Secondary | ICD-10-CM | POA: Diagnosis not present

## 2020-08-27 DIAGNOSIS — N202 Calculus of kidney with calculus of ureter: Secondary | ICD-10-CM | POA: Diagnosis not present

## 2020-08-28 HISTORY — PX: KIDNEY STONE SURGERY: SHX686

## 2020-09-05 DIAGNOSIS — N201 Calculus of ureter: Secondary | ICD-10-CM | POA: Diagnosis not present

## 2020-09-05 DIAGNOSIS — N200 Calculus of kidney: Secondary | ICD-10-CM | POA: Diagnosis not present

## 2020-09-10 ENCOUNTER — Encounter: Payer: Self-pay | Admitting: Urology

## 2020-09-10 DIAGNOSIS — R8271 Bacteriuria: Secondary | ICD-10-CM | POA: Diagnosis not present

## 2020-09-10 DIAGNOSIS — N23 Unspecified renal colic: Secondary | ICD-10-CM | POA: Diagnosis not present

## 2020-10-16 DIAGNOSIS — N202 Calculus of kidney with calculus of ureter: Secondary | ICD-10-CM | POA: Diagnosis not present

## 2020-10-21 DIAGNOSIS — N200 Calculus of kidney: Secondary | ICD-10-CM | POA: Diagnosis not present

## 2020-10-30 DIAGNOSIS — N202 Calculus of kidney with calculus of ureter: Secondary | ICD-10-CM | POA: Diagnosis not present

## 2020-11-05 DIAGNOSIS — L821 Other seborrheic keratosis: Secondary | ICD-10-CM | POA: Diagnosis not present

## 2020-11-05 DIAGNOSIS — D225 Melanocytic nevi of trunk: Secondary | ICD-10-CM | POA: Diagnosis not present

## 2020-11-05 DIAGNOSIS — D2261 Melanocytic nevi of right upper limb, including shoulder: Secondary | ICD-10-CM | POA: Diagnosis not present

## 2020-11-05 DIAGNOSIS — D485 Neoplasm of uncertain behavior of skin: Secondary | ICD-10-CM | POA: Diagnosis not present

## 2020-11-05 DIAGNOSIS — L814 Other melanin hyperpigmentation: Secondary | ICD-10-CM | POA: Diagnosis not present

## 2020-11-12 DIAGNOSIS — J101 Influenza due to other identified influenza virus with other respiratory manifestations: Secondary | ICD-10-CM | POA: Diagnosis not present

## 2021-01-15 ENCOUNTER — Other Ambulatory Visit: Payer: Self-pay

## 2021-01-16 ENCOUNTER — Encounter: Payer: Self-pay | Admitting: Internal Medicine

## 2021-01-16 ENCOUNTER — Ambulatory Visit: Payer: BC Managed Care – PPO | Admitting: Internal Medicine

## 2021-01-16 VITALS — BP 150/66 | HR 94 | Temp 98.1°F | Ht 63.0 in | Wt 176.1 lb

## 2021-01-16 DIAGNOSIS — F411 Generalized anxiety disorder: Secondary | ICD-10-CM

## 2021-01-16 DIAGNOSIS — N200 Calculus of kidney: Secondary | ICD-10-CM | POA: Diagnosis not present

## 2021-01-16 DIAGNOSIS — R030 Elevated blood-pressure reading, without diagnosis of hypertension: Secondary | ICD-10-CM | POA: Diagnosis not present

## 2021-01-16 MED ORDER — SERTRALINE HCL 50 MG PO TABS: 50.0000 mg | ORAL_TABLET | Freq: Every day | ORAL | 1 refills | Status: DC
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
## 2021-01-16 NOTE — Patient Instructions (Addendum)
-  Nice seeing you today!!  -Start sertraline 50 mg at bedtime.  -Make sure you are checking your blood pressure 2-3 times a week and bring those measurements into your next visit.  -Schedule follow up in 6-8 weeks.

## 2021-02-15 DIAGNOSIS — N202 Calculus of kidney with calculus of ureter: Secondary | ICD-10-CM | POA: Diagnosis not present

## 2021-02-15 DIAGNOSIS — N132 Hydronephrosis with renal and ureteral calculous obstruction: Secondary | ICD-10-CM | POA: Diagnosis not present

## 2021-02-26 ENCOUNTER — Other Ambulatory Visit: Payer: Self-pay

## 2021-02-27 ENCOUNTER — Other Ambulatory Visit: Payer: Self-pay | Admitting: Internal Medicine

## 2021-02-27 ENCOUNTER — Encounter: Payer: Self-pay | Admitting: Internal Medicine

## 2021-02-27 ENCOUNTER — Ambulatory Visit: Payer: BC Managed Care – PPO | Admitting: Internal Medicine

## 2021-02-27 VITALS — BP 120/90 | HR 79 | Temp 98.2°F | Wt 174.2 lb

## 2021-02-27 DIAGNOSIS — E785 Hyperlipidemia, unspecified: Secondary | ICD-10-CM | POA: Insufficient documentation

## 2021-02-27 DIAGNOSIS — F411 Generalized anxiety disorder: Secondary | ICD-10-CM

## 2021-02-27 DIAGNOSIS — Z23 Encounter for immunization: Secondary | ICD-10-CM | POA: Diagnosis not present

## 2021-02-27 DIAGNOSIS — E782 Mixed hyperlipidemia: Secondary | ICD-10-CM

## 2021-02-27 DIAGNOSIS — I10 Essential (primary) hypertension: Secondary | ICD-10-CM

## 2021-02-27 DIAGNOSIS — F40243 Fear of flying: Secondary | ICD-10-CM | POA: Diagnosis not present

## 2021-02-27 LAB — CBC WITH DIFFERENTIAL/PLATELET
Basophils Absolute: 0 10*3/uL (ref 0.0–0.1)
Basophils Relative: 0.5 % (ref 0.0–3.0)
Eosinophils Absolute: 0.1 10*3/uL (ref 0.0–0.7)
Eosinophils Relative: 1.2 % (ref 0.0–5.0)
HCT: 42.7 % (ref 36.0–46.0)
Hemoglobin: 14.2 g/dL (ref 12.0–15.0)
Lymphocytes Relative: 30.7 % (ref 12.0–46.0)
Lymphs Abs: 2 10*3/uL (ref 0.7–4.0)
MCHC: 33.2 g/dL (ref 30.0–36.0)
MCV: 87.1 fl (ref 78.0–100.0)
Monocytes Absolute: 0.7 10*3/uL (ref 0.1–1.0)
Monocytes Relative: 10.4 % (ref 3.0–12.0)
Neutro Abs: 3.7 10*3/uL (ref 1.4–7.7)
Neutrophils Relative %: 57.2 % (ref 43.0–77.0)
Platelets: 321 10*3/uL (ref 150.0–400.0)
RBC: 4.91 Mil/uL (ref 3.87–5.11)
RDW: 13.7 % (ref 11.5–15.5)
WBC: 6.5 10*3/uL (ref 4.0–10.5)

## 2021-02-27 LAB — COMPREHENSIVE METABOLIC PANEL
ALT: 13 U/L (ref 0–35)
AST: 11 U/L (ref 0–37)
Albumin: 4.5 g/dL (ref 3.5–5.2)
Alkaline Phosphatase: 57 U/L (ref 39–117)
BUN: 12 mg/dL (ref 6–23)
CO2: 24 mEq/L (ref 19–32)
Calcium: 9.6 mg/dL (ref 8.4–10.5)
Chloride: 104 mEq/L (ref 96–112)
Creatinine, Ser: 0.73 mg/dL (ref 0.40–1.20)
GFR: 101.67 mL/min (ref >60.00)
Glucose, Bld: 86 mg/dL (ref 70–99)
Potassium: 3.9 mEq/L (ref 3.5–5.1)
Sodium: 138 mEq/L (ref 135–145)
Total Bilirubin: 0.6 mg/dL (ref 0.2–1.2)
Total Protein: 7 g/dL (ref 6.0–8.3)

## 2021-02-27 LAB — LIPID PANEL
Cholesterol: 280 mg/dL — ABNORMAL HIGH (ref 0–200)
HDL: 52 mg/dL (ref >39.00)
NonHDL: 228.41
Total CHOL/HDL Ratio: 5
Triglycerides: 214 mg/dL — ABNORMAL HIGH (ref 0.0–149.0)
VLDL: 42.8 mg/dL — ABNORMAL HIGH (ref 0.0–40.0)

## 2021-02-27 LAB — LDL CHOLESTEROL, DIRECT: Direct LDL: 189 mg/dL

## 2021-02-27 MED ORDER — AMLODIPINE BESYLATE 5 MG PO TABS: 5.0000 mg | ORAL_TABLET | Freq: Every day | ORAL | 1 refills | Status: DC

## 2021-02-27 MED ORDER — SERTRALINE HCL 100 MG PO TABS: 100.0000 mg | ORAL_TABLET | Freq: Every day | ORAL | 1 refills | Status: DC

## 2021-02-27 MED ORDER — ALPRAZOLAM 0.5 MG PO TABS
0.5000 mg | ORAL_TABLET | Freq: Every day | ORAL | 0 refills | Status: DC | PRN
Start: 2021-02-27 — End: 2021-06-13

## 2021-02-27 MED ORDER — ATORVASTATIN CALCIUM 40 MG PO TABS: 40.0000 mg | ORAL_TABLET | Freq: Every day | ORAL | 1 refills | Status: DC

## 2021-02-27 NOTE — Progress Notes (Signed)
Established Patient Office Visit     This visit occurred during the SARS-CoV-2 public health emergency.  Safety protocols were in place, including screening questions prior to the visit, additional usage of staff PPE, and extensive cleaning of exam room while observing appropriate contact time as indicated for disinfecting solutions.    CC/Reason for Visit: Follow-up anxiety, elevated blood pressure.  HPI: Alexis Craig is a 40 y.o. female who is coming in today for the above mentioned reasons. Past Medical History is significant for: Generalized anxiety disorder, nephrolithiasis and a history of elevated blood pressures.  She feels like she has done well on Zoloft 50 mg.  She wonders about increasing dose to 100.  We had discussed last time a prescription for Xanax for her to take for airplane travel due to extreme fear of flying.  She is requesting flu vaccine today.  She is due for labs.  She brings in her ambulatory blood pressure measurements which are as follows:  178/89 168/87 152/107 159/87 165/95 149/91 149/89  She is feeling well and has no acute concerns.   Past Medical/Surgical History: Past Medical History:  Diagnosis Date   Anal fissure 04/09/2009   ANXIETY 03/09/2008   Depression    GERD (gastroesophageal reflux disease)    Gestational diabetes    metformin   History of alcohol abuse    7 years ago   Hx of varicella    INSOMNIA 10/05/2007   INTENTION TREMOR 06/13/2008   NEPHROLITHIASIS, HX OF 08/23/2007   passed stone, no surgery   RECTAL BLEEDING 04/09/2009   RESTLESS LEG SYNDROME 01/22/2009    Past Surgical History:  Procedure Laterality Date   DILATION AND EVACUATION N/A 03/31/2019   Procedure: DILATATION AND EVACUATION with genetic studies;  Surgeon: Louretta Shorten, MD;  Location: [facility];  Service: Gynecology;  Laterality: N/A;  with genetic studies   EXCISIONAL HEMORRHOIDECTOMY     KIDNEY STONE SURGERY Right 07/2020   KIDNEY STONE SURGERY  Left 08/2020   WISDOM TOOTH EXTRACTION      Social History:  reports that she quit smoking about 9 years ago. Her smoking use included cigarettes. She has a 6.00 pack-year smoking history. She has never used smokeless tobacco. She reports that she does not drink alcohol and does not use drugs.  Allergies: Allergies  Allergen Reactions   Nsaids Rash    Blisters also   Ibuprofen     Fixed drug reaction with bullous dermatitis    Family History:  Family History  Problem Relation Age of Onset   Diabetes Mother    Thyroid disease Mother    Cancer Mother    Heart disease Mother    Hyperlipidemia Father    Diabetes Father    Heart disease Maternal Uncle    Cancer Maternal Grandmother    Heart disease Paternal Grandfather      Current Outpatient Medications:    acetaminophen (TYLENOL) 500 MG tablet, Take 500 mg by mouth every 6 (six) hours as needed for moderate pain. , Disp: , Rfl:    ALPRAZolam (XANAX) 0.5 MG tablet, Take 1 tablet (0.5 mg total) by mouth daily as needed for anxiety (for airplane travel)., Disp: 20 tablet, Rfl: 0   amLODipine (NORVASC) 5 MG tablet, Take 1 tablet (5 mg total) by mouth daily., Disp: 90 tablet, Rfl: 1   cholecalciferol (VITAMIN D3) 25 MCG (1000 UT) tablet, Take 1,000 Units by mouth daily., Disp: , Rfl:    metFORMIN (GLUCOPHAGE-XR) 750 MG  24 hr tablet, Take 750 mg by mouth 2 (two) times daily., Disp: , Rfl:    omeprazole (PRILOSEC OTC) 20 MG tablet, Take 20 mg by mouth daily., Disp: , Rfl:    Prenatal Vit-Fe Fumarate-FA (PRENATAL MULTIVITAMIN) TABS tablet, Take 1 tablet by mouth daily at 12 noon., Disp: , Rfl:    sertraline (ZOLOFT) 100 MG tablet, Take 1 tablet (100 mg total) by mouth daily., Disp: 90 tablet, Rfl: 1  Review of Systems:  Constitutional: Denies fever, chills, diaphoresis, appetite change and fatigue.  HEENT: Denies photophobia, eye pain, redness, hearing loss, ear pain, congestion, sore throat, rhinorrhea, sneezing, mouth sores, trouble  swallowing, neck pain, neck stiffness and tinnitus.   Respiratory: Denies SOB, DOE, cough, chest tightness,  and wheezing.   Cardiovascular: Denies chest pain, palpitations and leg swelling.  Gastrointestinal: Denies nausea, vomiting, abdominal pain, diarrhea, constipation, blood in stool and abdominal distention.  Genitourinary: Denies dysuria, urgency, frequency, hematuria, flank pain and difficulty urinating.  Endocrine: Denies: hot or cold intolerance, sweats, changes in hair or nails, polyuria, polydipsia. Musculoskeletal: Denies myalgias, back pain, joint swelling, arthralgias and gait problem.  Skin: Denies pallor, rash and wound.  Neurological: Denies dizziness, seizures, syncope, weakness, light-headedness, numbness and headaches.  Hematological: Denies adenopathy. Easy bruising, personal or family bleeding history  Psychiatric/Behavioral: Denies suicidal ideation, mood changes, confusion, nervousness, sleep disturbance and agitation    Physical Exam: Vitals:   02/27/21 0816  BP: 120/90  Pulse: 79  Temp: 98.2 F (36.8 C)  TempSrc: Oral  SpO2: 99%  Weight: 174 lb 3.2 oz (79 kg)    Body mass index is 30.86 kg/m.   Constitutional: NAD, calm, comfortable Eyes: PERRL, lids and conjunctivae normal ENMT: Mucous membranes are moist.  Respiratory: clear to auscultation bilaterally, no wheezing, no crackles. Normal respiratory effort. No accessory muscle use.  Cardiovascular: Regular rate and rhythm, no murmurs / rubs / gallops. No extremity edema.  Neurologic: Grossly intact and nonfocal Psychiatric: Normal judgment and insight. Alert and oriented x 3. Normal mood.    Impression and Plan:  GAD (generalized anxiety disorder)  -Improved, I agree with increasing sertraline from 50 to 100 mg daily.  Fear of flying  - Plan: ALPRAZolam (XANAX) 0.5 MG tablet  Primary hypertension  - Plan: amLODipine (NORVASC) 5 MG tablet, CBC with Differential/Platelet, Comprehensive  metabolic panel, Lipid panel -With ambulatory and in office elevated blood pressures, I will go ahead and make this diagnosis today. -Start amlodipine 5 mg daily. -She will return in 6 weeks with ambulatory blood pressure measurements.  Need for influenza vaccination -Flu vaccine administered today.  Time spent: 32 minutes reviewing chart, interviewing and examining patient and formulating plan of care.   Patient Instructions  -Nice seeing you today!!  -Lab work today; will notify you once results are available.  -Flu vaccine today.  -Increase sertraline to 100 mg daily.  -Alprazolam 0.5 mg as needed for airplane travel.  -Start amlodipine 5 mg daily.  -Schedule follow up in 6 weeks.   Lelon Frohlich, MD [facility] at [facility]

## 2021-02-27 NOTE — Patient Instructions (Addendum)
-  Nice seeing you today!!  -Lab work today; will notify you once results are available.  -Flu vaccine today.  -Increase sertraline to 100 mg daily.  -Alprazolam 0.5 mg as needed for airplane travel.  -Start amlodipine 5 mg daily.  -Schedule follow up in 6 weeks.

## 2021-02-27 NOTE — Addendum Note (Signed)
Addended by: Elmer Picker on: 02/27/2021 08:48 AM   Modules accepted: Orders

## 2021-02-28 ENCOUNTER — Other Ambulatory Visit: Payer: Self-pay | Admitting: Internal Medicine

## 2021-02-28 DIAGNOSIS — E782 Mixed hyperlipidemia: Secondary | ICD-10-CM

## 2021-04-08 DIAGNOSIS — Z1231 Encounter for screening mammogram for malignant neoplasm of breast: Secondary | ICD-10-CM | POA: Diagnosis not present

## 2021-04-08 DIAGNOSIS — Z6829 Body mass index (BMI) 29.0-29.9, adult: Secondary | ICD-10-CM | POA: Diagnosis not present

## 2021-04-08 DIAGNOSIS — Z01419 Encounter for gynecological examination (general) (routine) without abnormal findings: Secondary | ICD-10-CM | POA: Diagnosis not present

## 2021-04-15 ENCOUNTER — Other Ambulatory Visit: Payer: Self-pay | Admitting: Obstetrics and Gynecology

## 2021-04-15 DIAGNOSIS — R928 Other abnormal and inconclusive findings on diagnostic imaging of breast: Secondary | ICD-10-CM

## 2021-04-18 ENCOUNTER — Encounter: Payer: Self-pay | Admitting: Internal Medicine

## 2021-04-18 ENCOUNTER — Other Ambulatory Visit: Payer: Self-pay

## 2021-04-18 ENCOUNTER — Ambulatory Visit: Payer: BC Managed Care – PPO | Admitting: Internal Medicine

## 2021-04-18 VITALS — BP 140/90 | Temp 98.1°F | Wt 176.6 lb

## 2021-04-18 DIAGNOSIS — G4700 Insomnia, unspecified: Secondary | ICD-10-CM

## 2021-04-18 DIAGNOSIS — F411 Generalized anxiety disorder: Secondary | ICD-10-CM

## 2021-04-18 DIAGNOSIS — G2581 Restless legs syndrome: Secondary | ICD-10-CM

## 2021-04-18 DIAGNOSIS — I10 Essential (primary) hypertension: Secondary | ICD-10-CM | POA: Diagnosis not present

## 2021-04-18 LAB — COMPREHENSIVE METABOLIC PANEL
ALT: 22 U/L (ref 0–35)
AST: 16 U/L (ref 0–37)
Albumin: 4.5 g/dL (ref 3.5–5.2)
Alkaline Phosphatase: 61 U/L (ref 39–117)
BUN: 15 mg/dL (ref 6–23)
CO2: 26 mEq/L (ref 19–32)
Calcium: 9.7 mg/dL (ref 8.4–10.5)
Chloride: 105 mEq/L (ref 96–112)
Creatinine, Ser: 0.75 mg/dL (ref 0.40–1.20)
GFR: 98.33 mL/min (ref >60.00)
Glucose, Bld: 88 mg/dL (ref 70–99)
Potassium: 3.7 mEq/L (ref 3.5–5.1)
Sodium: 140 mEq/L (ref 135–145)
Total Bilirubin: 0.5 mg/dL (ref 0.2–1.2)
Total Protein: 7.1 g/dL (ref 6.0–8.3)

## 2021-04-18 LAB — MAGNESIUM: Magnesium: 1.7 mg/dL (ref 1.5–2.5)

## 2021-04-18 LAB — CBC WITH DIFFERENTIAL/PLATELET
Basophils Absolute: 0 10*3/uL (ref 0.0–0.1)
Basophils Relative: 0.2 % (ref 0.0–3.0)
Eosinophils Absolute: 0.1 10*3/uL (ref 0.0–0.7)
Eosinophils Relative: 1.7 % (ref 0.0–5.0)
HCT: 41.2 % (ref 36.0–46.0)
Hemoglobin: 14.1 g/dL (ref 12.0–15.0)
Lymphocytes Relative: 27.3 % (ref 12.0–46.0)
Lymphs Abs: 1.9 10*3/uL (ref 0.7–4.0)
MCHC: 34.1 g/dL (ref 30.0–36.0)
MCV: 86.6 fl (ref 78.0–100.0)
Monocytes Absolute: 0.7 10*3/uL (ref 0.1–1.0)
Monocytes Relative: 10 % (ref 3.0–12.0)
Neutro Abs: 4.2 10*3/uL (ref 1.4–7.7)
Neutrophils Relative %: 60.8 % (ref 43.0–77.0)
Platelets: 307 10*3/uL (ref 150.0–400.0)
RBC: 4.75 Mil/uL (ref 3.87–5.11)
RDW: 13.8 % (ref 11.5–15.5)
WBC: 6.9 10*3/uL (ref 4.0–10.5)

## 2021-04-18 LAB — FERRITIN: Ferritin: 34.4 ng/mL (ref 10.0–291.0)

## 2021-04-18 LAB — IBC PANEL
Iron: 70 ug/dL (ref 42–145)
Saturation Ratios: 17.6 % — ABNORMAL LOW (ref 20.0–50.0)
TIBC: 397.6 ug/dL (ref 250.0–450.0)
Transferrin: 284 mg/dL (ref 212.0–360.0)

## 2021-04-18 MED ORDER — HYDROCHLOROTHIAZIDE 25 MG PO TABS: 25.0000 mg | ORAL_TABLET | Freq: Every day | ORAL | 1 refills | Status: DC

## 2021-04-18 NOTE — Progress Notes (Signed)
Established Patient Office Visit     This visit occurred during the SARS-CoV-2 public health emergency.  Safety protocols were in place, including screening questions prior to the visit, additional usage of staff PPE, and extensive cleaning of exam room while observing appropriate contact time as indicated for disinfecting solutions.    CC/Reason for Visit: 8 weeks follow-up chronic medical conditions  HPI: Alexis Craig is a 40 y.o. female who is coming in today for the above mentioned reasons. Past Medical History is significant for: Hypertension and generalized anxiety disorder.  At last visit she was started on amlodipine 5 mg daily.  I will insert her ambulatory blood pressure measurements below.  She states that her anxiety is significantly improved after increasing Zoloft from 50 to 100 mg.  However she continues to have significant difficulty sleeping.  She thinks part of it might be related to her restless leg syndrome that she has been diagnosed with in the past.  She is wondering about restarting pramipexole.      Past Medical/Surgical History: Past Medical History:  Diagnosis Date   Anal fissure 04/09/2009   ANXIETY 03/09/2008   Depression    GERD (gastroesophageal reflux disease)    Gestational diabetes    metformin   History of alcohol abuse    7 years ago   Hx of varicella    INSOMNIA 10/05/2007   INTENTION TREMOR 06/13/2008   NEPHROLITHIASIS, HX OF 08/23/2007   passed stone, no surgery   RECTAL BLEEDING 04/09/2009   RESTLESS LEG SYNDROME 01/22/2009    Past Surgical History:  Procedure Laterality Date   DILATION AND EVACUATION N/A 03/31/2019   Procedure: DILATATION AND EVACUATION with genetic studies;  Surgeon: Louretta Shorten, MD;  Location: [facility];  Service: Gynecology;  Laterality: N/A;  with genetic studies   EXCISIONAL HEMORRHOIDECTOMY     KIDNEY STONE SURGERY Right 07/2020   KIDNEY STONE SURGERY Left 08/2020   WISDOM TOOTH EXTRACTION      Social  History:  reports that she quit smoking about 9 years ago. Her smoking use included cigarettes. She has a 6.00 pack-year smoking history. She has never used smokeless tobacco. She reports that she does not drink alcohol and does not use drugs.  Allergies: Allergies  Allergen Reactions   Nsaids Rash    Blisters also   Ibuprofen     Fixed drug reaction with bullous dermatitis    Family History:  Family History  Problem Relation Age of Onset   Diabetes Mother    Thyroid disease Mother    Cancer Mother    Heart disease Mother    Hyperlipidemia Father    Diabetes Father    Heart disease Maternal Uncle    Cancer Maternal Grandmother    Heart disease Paternal Grandfather      Current Outpatient Medications:    acetaminophen (TYLENOL) 500 MG tablet, Take 500 mg by mouth every 6 (six) hours as needed for moderate pain. , Disp: , Rfl:    ALPRAZolam (XANAX) 0.5 MG tablet, Take 1 tablet (0.5 mg total) by mouth daily as needed for anxiety (for airplane travel)., Disp: 20 tablet, Rfl: 0   amLODipine (NORVASC) 5 MG tablet, Take 1 tablet (5 mg total) by mouth daily., Disp: 90 tablet, Rfl: 1   atorvastatin (LIPITOR) 40 MG tablet, Take 1 tablet (40 mg total) by mouth daily., Disp: 90 tablet, Rfl: 1   cholecalciferol (VITAMIN D3) 25 MCG (1000 UT) tablet, Take 1,000 Units by mouth daily.,  Disp: , Rfl:    hydrochlorothiazide (HYDRODIURIL) 25 MG tablet, Take 1 tablet (25 mg total) by mouth daily., Disp: 90 tablet, Rfl: 1   metFORMIN (GLUCOPHAGE-XR) 750 MG 24 hr tablet, Take 750 mg by mouth 2 (two) times daily., Disp: , Rfl:    omeprazole (PRILOSEC OTC) 20 MG tablet, Take 20 mg by mouth daily., Disp: , Rfl:    Prenatal Vit-Fe Fumarate-FA (PRENATAL MULTIVITAMIN) TABS tablet, Take 1 tablet by mouth daily at 12 noon., Disp: , Rfl:    sertraline (ZOLOFT) 100 MG tablet, Take 1 tablet (100 mg total) by mouth daily., Disp: 90 tablet, Rfl: 1  Review of Systems:  Constitutional: Denies fever, chills,  diaphoresis, appetite change and fatigue.  HEENT: Denies photophobia, eye pain, redness, hearing loss, ear pain, congestion, sore throat, rhinorrhea, sneezing, mouth sores, trouble swallowing, neck pain, neck stiffness and tinnitus.   Respiratory: Denies SOB, DOE, cough, chest tightness,  and wheezing.   Cardiovascular: Denies chest pain, palpitations and leg swelling.  Gastrointestinal: Denies nausea, vomiting, abdominal pain, diarrhea, constipation, blood in stool and abdominal distention.  Genitourinary: Denies dysuria, urgency, frequency, hematuria, flank pain and difficulty urinating.  Endocrine: Denies: hot or cold intolerance, sweats, changes in hair or nails, polyuria, polydipsia. Musculoskeletal: Denies myalgias, back pain, joint swelling, arthralgias and gait problem.  Skin: Denies pallor, rash and wound.  Neurological: Denies dizziness, seizures, syncope, weakness, light-headedness, numbness and headaches.  Hematological: Denies adenopathy. Easy bruising, personal or family bleeding history  Psychiatric/Behavioral: Denies suicidal ideation, mood changes, confusion, nervousness and agitation    Physical Exam: Vitals:   04/18/21 0831  BP: 140/90  Temp: 98.1 F (36.7 C)  TempSrc: Oral  SpO2: 95%  Weight: 176 lb 9.6 oz (80.1 kg)    Body mass index is 31.28 kg/m.   Constitutional: NAD, calm, comfortable Eyes: PERRL, lids and conjunctivae normal, wears corrective lenses ENMT: Mucous membranes are moist.  Neurologic: Grossly intact and nonfocal Psychiatric: Normal judgment and insight. Alert and oriented x 3. Normal mood.    Impression and Plan:  Primary hypertension  - Plan: hydrochlorothiazide (HYDRODIURIL) 25 MG tablet -Not well controlled. -Continue amlodipine 5 mg daily and add hydrochlorothiazide 25 mg daily. -Continue ambulatory blood pressure monitoring in return in 6 to 8 weeks for follow-up.  Anxiety state -Improved on Zoloft 100 mg.  Insomnia,  unspecified type RESTLESS LEG SYNDROME  - Plan: CBC with Differential/Platelet, Comprehensive metabolic panel, Magnesium, IBC panel, Ferritin -Check labs today to rule out anemia, iron deficiency, electrolyte abnormalities that may play a role into restless leg syndrome. -We have discussed resuming CBT sessions which could be helpful with treatment of her anxiety and insomnia. -We have discussed sleep hygiene techniques and she has also been provided a handout with information. -Assuming above labs are normal, we can consider resuming pramipexole at next visit.  Time spent: 33 minutes reviewing chart, interviewing and examining patient and formulating plan of care.   Patient Instructions  -Nice seeing you today!!  -Lab work today; will notify you once results are available.  -Work on sleep hygiene.  -Start HCTZ 25 mg daily.  -Schedule follow up in 8 weeks.    Lelon Frohlich, MD [facility] at [facility]

## 2021-04-18 NOTE — Patient Instructions (Addendum)
-  Nice seeing you today!!  -Lab work today; will notify you once results are available.  -Work on sleep hygiene.  -Start HCTZ 25 mg daily.  -Schedule follow up in 8 weeks.

## 2021-04-19 ENCOUNTER — Other Ambulatory Visit: Payer: Self-pay | Admitting: Obstetrics and Gynecology

## 2021-04-19 ENCOUNTER — Ambulatory Visit
Admission: RE | Admit: 2021-04-19 | Discharge: 2021-04-19 | Disposition: A | Discharge status: Home / Self Care | Payer: BC Managed Care – PPO | Source: Ambulatory Visit | Attending: Obstetrics and Gynecology | Admitting: Obstetrics and Gynecology

## 2021-04-19 DIAGNOSIS — R921 Mammographic calcification found on diagnostic imaging of breast: Secondary | ICD-10-CM

## 2021-04-19 DIAGNOSIS — R922 Inconclusive mammogram: Secondary | ICD-10-CM | POA: Diagnosis not present

## 2021-04-19 DIAGNOSIS — R928 Other abnormal and inconclusive findings on diagnostic imaging of breast: Secondary | ICD-10-CM

## 2021-04-25 ENCOUNTER — Other Ambulatory Visit: Payer: Self-pay

## 2021-04-25 ENCOUNTER — Ambulatory Visit
Admission: RE | Admit: 2021-04-25 | Discharge: 2021-04-25 | Disposition: A | Discharge status: Home / Self Care | Payer: BC Managed Care – PPO | Source: Ambulatory Visit | Attending: Obstetrics and Gynecology | Admitting: Obstetrics and Gynecology

## 2021-04-25 ENCOUNTER — Other Ambulatory Visit: Payer: Self-pay | Admitting: Obstetrics and Gynecology

## 2021-04-25 DIAGNOSIS — R921 Mammographic calcification found on diagnostic imaging of breast: Secondary | ICD-10-CM | POA: Diagnosis not present

## 2021-04-25 DIAGNOSIS — N62 Hypertrophy of breast: Secondary | ICD-10-CM | POA: Diagnosis not present

## 2021-06-13 ENCOUNTER — Ambulatory Visit: Payer: BC Managed Care – PPO | Admitting: Internal Medicine

## 2021-06-13 ENCOUNTER — Encounter: Payer: Self-pay | Admitting: Internal Medicine

## 2021-06-13 VITALS — BP 122/82 | HR 80 | Temp 98.2°F | Ht 63.0 in | Wt 179.4 lb

## 2021-06-13 DIAGNOSIS — G2581 Restless legs syndrome: Secondary | ICD-10-CM | POA: Diagnosis not present

## 2021-06-13 DIAGNOSIS — I10 Essential (primary) hypertension: Secondary | ICD-10-CM | POA: Diagnosis not present

## 2021-06-13 DIAGNOSIS — F40243 Fear of flying: Secondary | ICD-10-CM | POA: Diagnosis not present

## 2021-06-13 MED ORDER — PRAMIPEXOLE DIHYDROCHLORIDE 0.5 MG PO TABS: 0.5000 mg | ORAL_TABLET | Freq: Every evening | ORAL | 2 refills | Status: DC | PRN

## 2021-06-13 MED ORDER — ALPRAZOLAM 0.5 MG PO TABS: 0.5000 mg | ORAL_TABLET | Freq: Every day | ORAL | 0 refills | Status: AC | PRN

## 2021-06-13 NOTE — Progress Notes (Signed)
Established Patient Office Visit     This visit occurred during the SARS-CoV-2 public health emergency.  Safety protocols were in place, including screening questions prior to the visit, additional usage of staff PPE, and extensive cleaning of exam room while observing appropriate contact time as indicated for disinfecting solutions.    CC/Reason for Visit: Follow-up chronic conditions  HPI: Alexis Craig is a 40 y.o. female who is coming in today for the above mentioned reasons. Past Medical History is significant for: Hypertension and generalized anxiety disorder.  She is now on amlodipine and hydrochlorothiazide and is tolerating it well.  Her home ambulatory measurements are higher than in office measurements, I have asked her to bring in her blood pressure cuff at next visit.  She would like to refill pramipexole for her restless leg syndrome.  At last visit labs were drawn, electrolytes, iron studies were within normal limits.   Past Medical/Surgical History: Past Medical History:  Diagnosis Date   Anal fissure 04/09/2009   ANXIETY 03/09/2008   Depression    GERD (gastroesophageal reflux disease)    Gestational diabetes    metformin   History of alcohol abuse    7 years ago   Hx of varicella    INSOMNIA 10/05/2007   INTENTION TREMOR 06/13/2008   NEPHROLITHIASIS, HX OF 08/23/2007   passed stone, no surgery   RECTAL BLEEDING 04/09/2009   RESTLESS LEG SYNDROME 01/22/2009    Past Surgical History:  Procedure Laterality Date   DILATION AND EVACUATION N/A 03/31/2019   Procedure: DILATATION AND EVACUATION with genetic studies;  Surgeon: Louretta Shorten, MD;  Location: [facility];  Service: Gynecology;  Laterality: N/A;  with genetic studies   EXCISIONAL HEMORRHOIDECTOMY     KIDNEY STONE SURGERY Right 07/2020   KIDNEY STONE SURGERY Left 08/2020   WISDOM TOOTH EXTRACTION      Social History:  reports that she quit smoking about 9 years ago. Her smoking use included  cigarettes. She has a 6.00 pack-year smoking history. She has never used smokeless tobacco. She reports that she does not drink alcohol and does not use drugs.  Allergies: Allergies  Allergen Reactions   Nsaids Rash    Blisters also   Ibuprofen     Fixed drug reaction with bullous dermatitis    Family History:  Family History  Problem Relation Age of Onset   Diabetes Mother    Thyroid disease Mother    Cancer Mother    Heart disease Mother    Hyperlipidemia Father    Diabetes Father    Heart disease Maternal Uncle    Cancer Maternal Grandmother    Heart disease Paternal Grandfather      Current Outpatient Medications:    acetaminophen (TYLENOL) 500 MG tablet, Take 500 mg by mouth every 6 (six) hours as needed for moderate pain. , Disp: , Rfl:    amLODipine (NORVASC) 5 MG tablet, Take 1 tablet (5 mg total) by mouth daily., Disp: 90 tablet, Rfl: 1   atorvastatin (LIPITOR) 40 MG tablet, Take 1 tablet (40 mg total) by mouth daily., Disp: 90 tablet, Rfl: 1   cholecalciferol (VITAMIN D3) 25 MCG (1000 UT) tablet, Take 1,000 Units by mouth daily., Disp: , Rfl:    hydrochlorothiazide (HYDRODIURIL) 25 MG tablet, Take 1 tablet (25 mg total) by mouth daily., Disp: 90 tablet, Rfl: 1   metFORMIN (GLUCOPHAGE-XR) 750 MG 24 hr tablet, Take 750 mg by mouth 2 (two) times daily., Disp: , Rfl:  omeprazole (PRILOSEC OTC) 20 MG tablet, Take 20 mg by mouth daily., Disp: , Rfl:    pramipexole (MIRAPEX) 0.5 MG tablet, Take 1 tablet (0.5 mg total) by mouth at bedtime as needed., Disp: 30 tablet, Rfl: 2   Prenatal Vit-Fe Fumarate-FA (PRENATAL MULTIVITAMIN) TABS tablet, Take 1 tablet by mouth daily at 12 noon., Disp: , Rfl:    sertraline (ZOLOFT) 100 MG tablet, Take 1 tablet (100 mg total) by mouth daily., Disp: 90 tablet, Rfl: 1   ALPRAZolam (XANAX) 0.5 MG tablet, Take 1 tablet (0.5 mg total) by mouth daily as needed for anxiety (for airplane travel)., Disp: 20 tablet, Rfl: 0  Review of Systems:   Constitutional: Denies fever, chills, diaphoresis, appetite change and fatigue.  HEENT: Denies photophobia, eye pain, redness, hearing loss, ear pain, congestion, sore throat, rhinorrhea, sneezing, mouth sores, trouble swallowing, neck pain, neck stiffness and tinnitus.   Respiratory: Denies SOB, DOE, cough, chest tightness,  and wheezing.   Cardiovascular: Denies chest pain, palpitations and leg swelling.  Gastrointestinal: Denies nausea, vomiting, abdominal pain, diarrhea, constipation, blood in stool and abdominal distention.  Genitourinary: Denies dysuria, urgency, frequency, hematuria, flank pain and difficulty urinating.  Endocrine: Denies: hot or cold intolerance, sweats, changes in hair or nails, polyuria, polydipsia. Musculoskeletal: Denies myalgias, back pain, joint swelling, arthralgias and gait problem.  Skin: Denies pallor, rash and wound.  Neurological: Denies dizziness, seizures, syncope, weakness, light-headedness, numbness and headaches.  Hematological: Denies adenopathy. Easy bruising, personal or family bleeding history  Psychiatric/Behavioral: Denies suicidal ideation, mood changes, confusion, nervousness, sleep disturbance and agitation    Physical Exam: Vitals:   06/13/21 0833  BP: 122/82  Pulse: 80  Temp: 98.2 F (36.8 C)  TempSrc: Oral  SpO2: 98%  Weight: 179 lb 6.4 oz (81.4 kg)  Height: 5\' 3"  (1.6 m)    Body mass index is 31.78 kg/m.   Constitutional: NAD, calm, comfortable Eyes: PERRL, lids and conjunctivae normal ENMT: Mucous membranes are moist.  Respiratory: clear to auscultation bilaterally, no wheezing, no crackles. Normal respiratory effort. No accessory muscle use.  Cardiovascular: Regular rate and rhythm, no murmurs / rubs / gallops. No extremity edema.  Neurologic: Grossly intact and nonfocal Psychiatric: Normal judgment and insight. Alert and oriented x 3. Normal mood.    Impression and Plan:  Primary hypertension -Well-controlled per  office measurements, continue hydrochlorothiazide and amlodipine  Fear of flying  - Plan: ALPRAZolam (XANAX) 0.5 MG tablet  RESTLESS LEG SYNDROME  - Plan: pramipexole (MIRAPEX) 0.5 MG tablet at bedtime as needed.  Time spent: 31 minutes reviewing chart, interviewing and examining patient and formulating plan of care.   Patient Instructions  -Nice seeing you today!!  -Start pramipexole 0.5 mg at bedtime.    Lelon Frohlich, MD [facility] at [facility]

## 2021-06-13 NOTE — Patient Instructions (Signed)
-  Nice seeing you today!!  -Start pramipexole 0.5 mg at bedtime.

## 2021-07-08 DIAGNOSIS — N912 Amenorrhea, unspecified: Secondary | ICD-10-CM | POA: Diagnosis not present

## 2021-07-10 DIAGNOSIS — N912 Amenorrhea, unspecified: Secondary | ICD-10-CM | POA: Diagnosis not present

## 2021-07-16 DIAGNOSIS — N912 Amenorrhea, unspecified: Secondary | ICD-10-CM | POA: Diagnosis not present

## 2021-07-24 DIAGNOSIS — N911 Secondary amenorrhea: Secondary | ICD-10-CM | POA: Diagnosis not present

## 2021-07-26 DIAGNOSIS — N911 Secondary amenorrhea: Secondary | ICD-10-CM | POA: Diagnosis not present

## 2021-08-01 DIAGNOSIS — O200 Threatened abortion: Secondary | ICD-10-CM | POA: Diagnosis not present

## 2021-08-01 DIAGNOSIS — N911 Secondary amenorrhea: Secondary | ICD-10-CM | POA: Diagnosis not present

## 2021-08-08 DIAGNOSIS — O021 Missed abortion: Secondary | ICD-10-CM | POA: Diagnosis not present

## 2021-08-12 DIAGNOSIS — M2762 Post-osseointegration biological failure of dental implant: Secondary | ICD-10-CM | POA: Diagnosis not present

## 2021-08-19 ENCOUNTER — Other Ambulatory Visit: Payer: Self-pay | Admitting: Internal Medicine

## 2021-08-19 DIAGNOSIS — F411 Generalized anxiety disorder: Secondary | ICD-10-CM

## 2021-08-19 DIAGNOSIS — I10 Essential (primary) hypertension: Secondary | ICD-10-CM

## 2021-08-21 ENCOUNTER — Other Ambulatory Visit: Payer: Self-pay | Admitting: Internal Medicine

## 2021-08-21 DIAGNOSIS — E782 Mixed hyperlipidemia: Secondary | ICD-10-CM

## 2021-09-20 ENCOUNTER — Other Ambulatory Visit: Payer: Self-pay | Admitting: Internal Medicine

## 2021-09-20 DIAGNOSIS — G2581 Restless legs syndrome: Secondary | ICD-10-CM

## 2021-09-24 DIAGNOSIS — N23 Unspecified renal colic: Secondary | ICD-10-CM | POA: Diagnosis not present

## 2021-09-24 DIAGNOSIS — R311 Benign essential microscopic hematuria: Secondary | ICD-10-CM | POA: Diagnosis not present

## 2021-10-08 ENCOUNTER — Other Ambulatory Visit: Payer: Self-pay | Admitting: Internal Medicine

## 2021-10-08 DIAGNOSIS — I10 Essential (primary) hypertension: Secondary | ICD-10-CM

## 2021-11-05 DIAGNOSIS — D225 Melanocytic nevi of trunk: Secondary | ICD-10-CM | POA: Diagnosis not present

## 2021-11-05 DIAGNOSIS — D2261 Melanocytic nevi of right upper limb, including shoulder: Secondary | ICD-10-CM | POA: Diagnosis not present

## 2021-11-05 DIAGNOSIS — D485 Neoplasm of uncertain behavior of skin: Secondary | ICD-10-CM | POA: Diagnosis not present

## 2021-11-05 DIAGNOSIS — L814 Other melanin hyperpigmentation: Secondary | ICD-10-CM | POA: Diagnosis not present

## 2021-11-05 DIAGNOSIS — L821 Other seborrheic keratosis: Secondary | ICD-10-CM | POA: Diagnosis not present

## 2021-12-06 IMAGING — MG MM BREAST LOCALIZATION CLIP
4 series · 4 of 12 positions shown · non-contrast
Comparison: Previous exam(s).

CLINICAL DATA: Evaluate X biopsy clip placement following LEFT
breast biopsy.

EXAM:
3D DIAGNOSTIC LEFT MAMMOGRAM POST STEREOTACTIC BIOPSY

[L CC synth-2D]
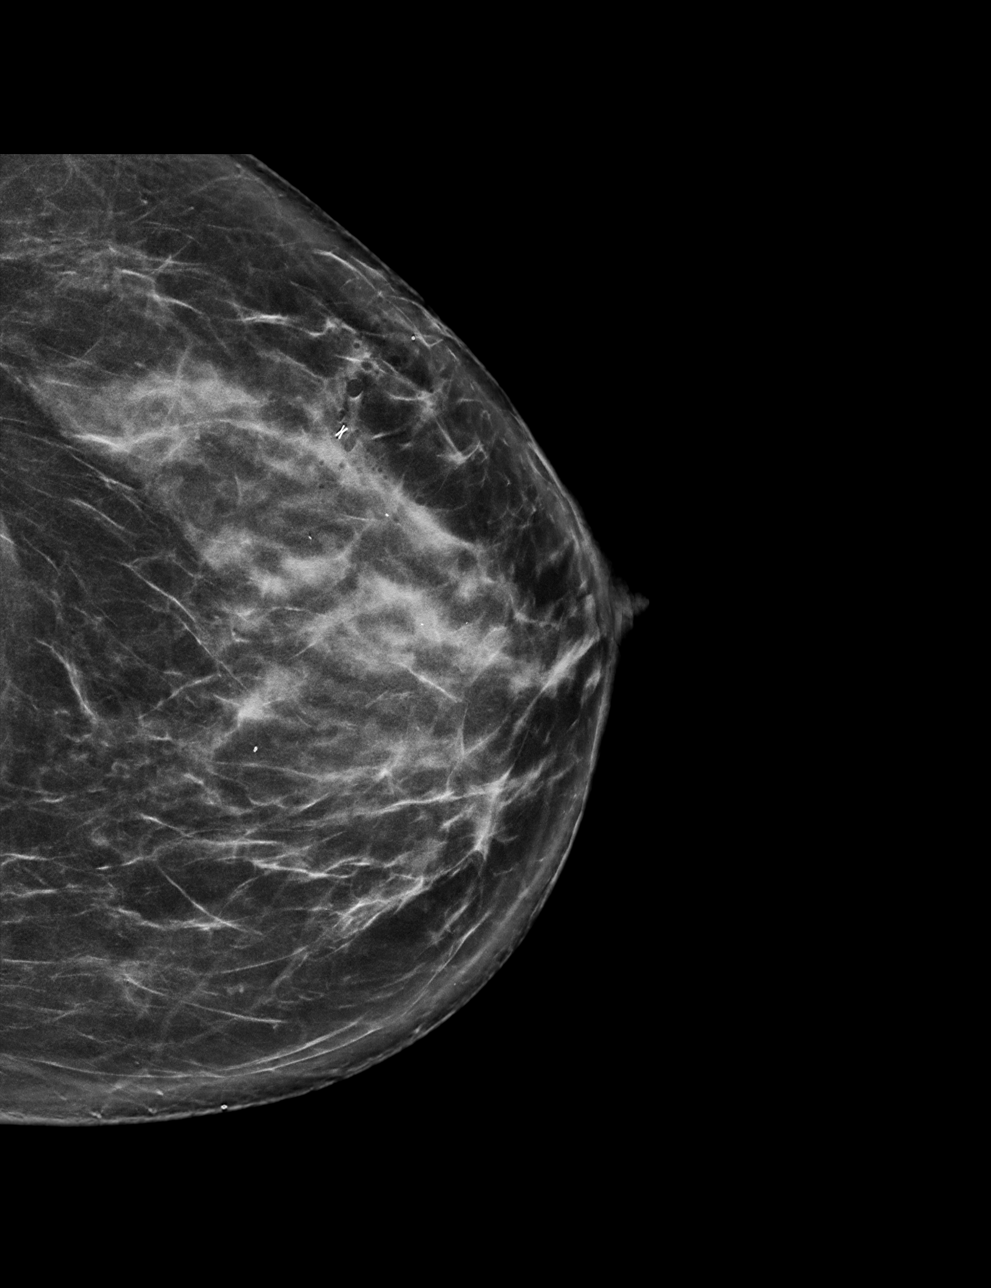

[L LM synth-2D]
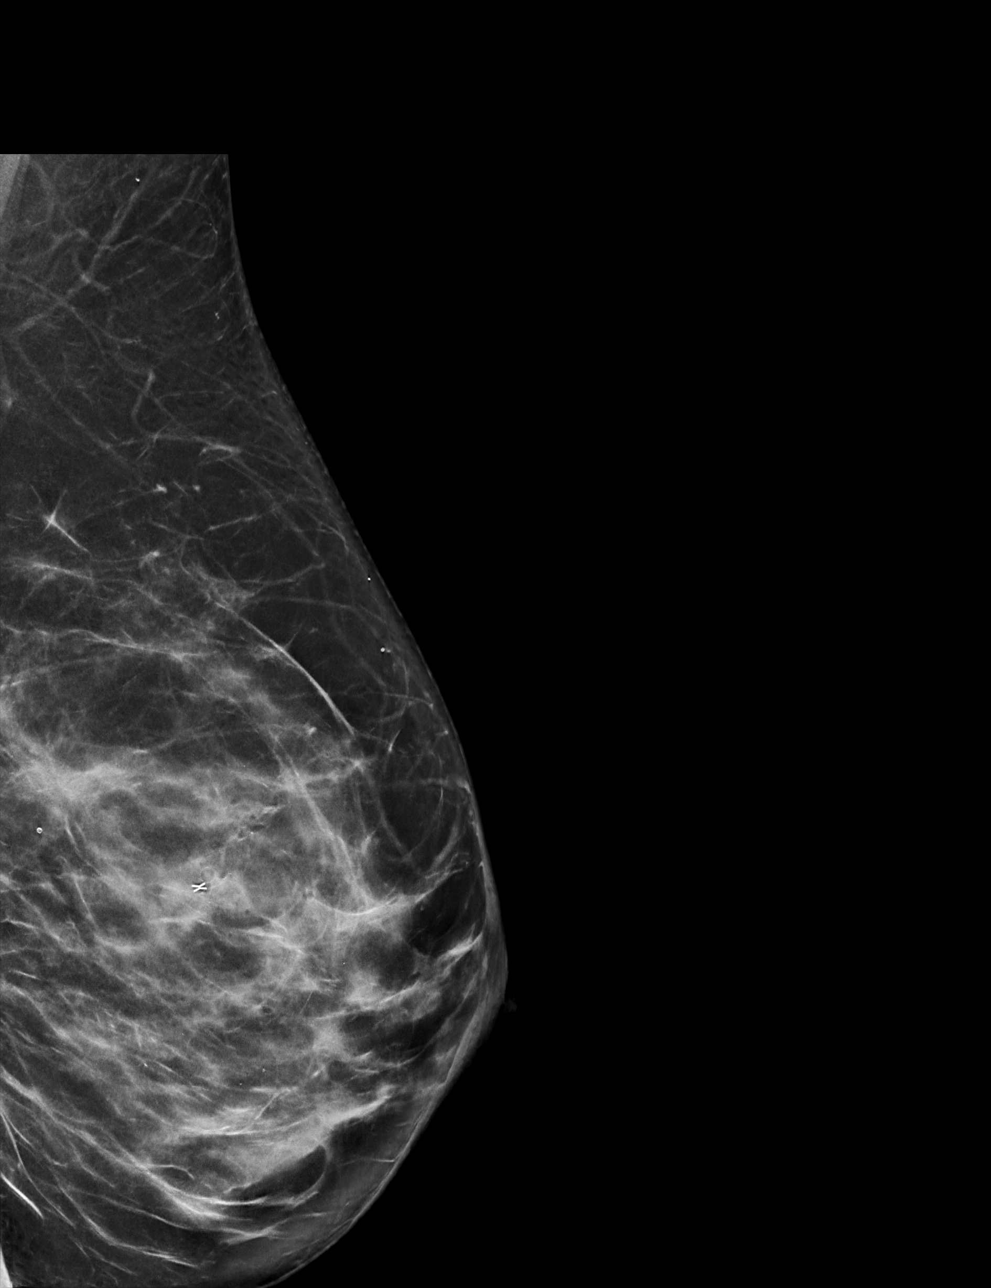

[L CC tomo · tomo slice 37/72.0]
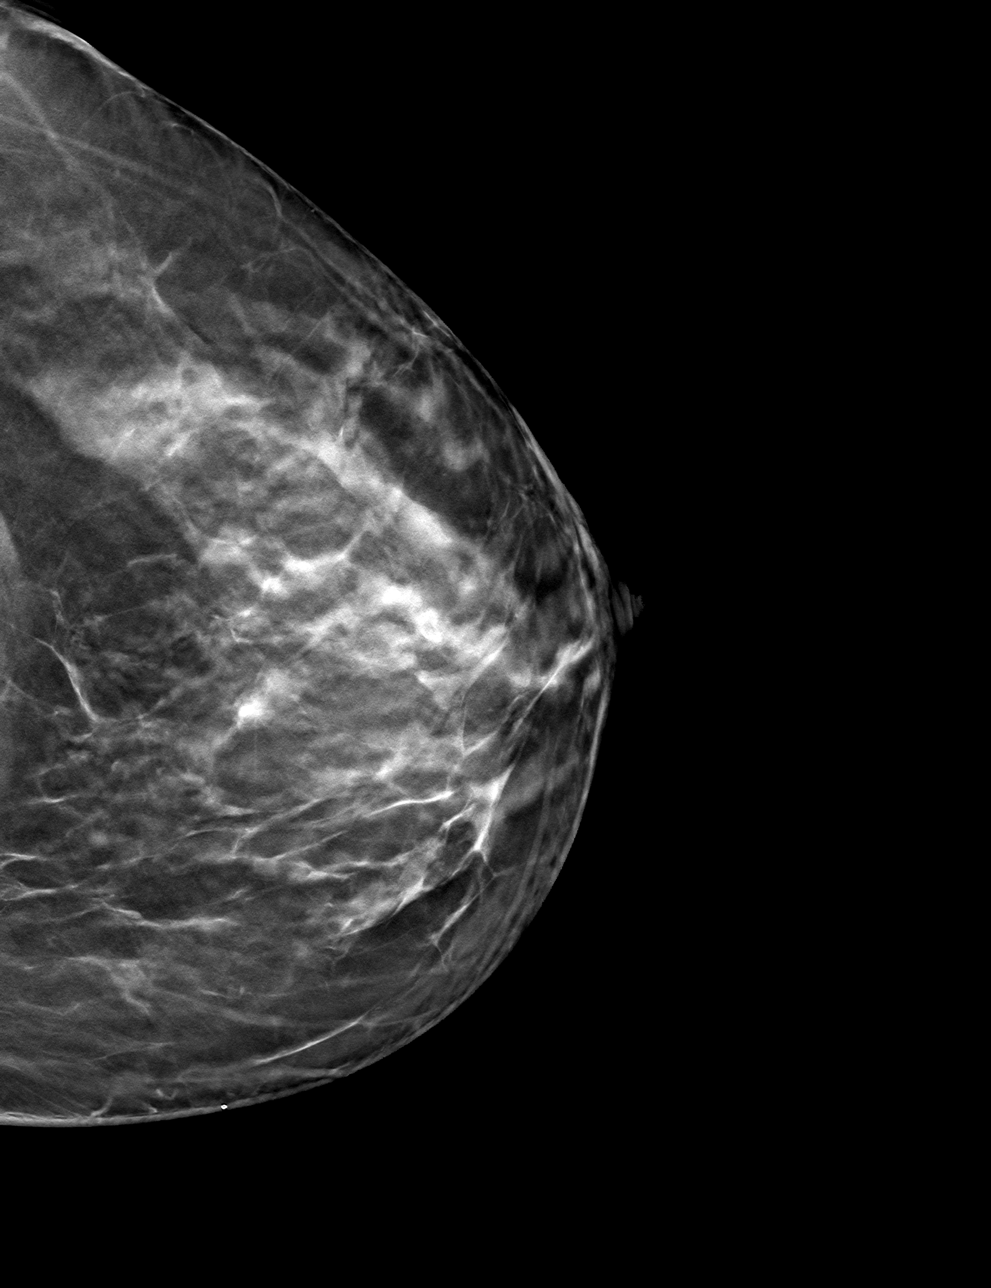

[L LM tomo · tomo slice 37/73.0]
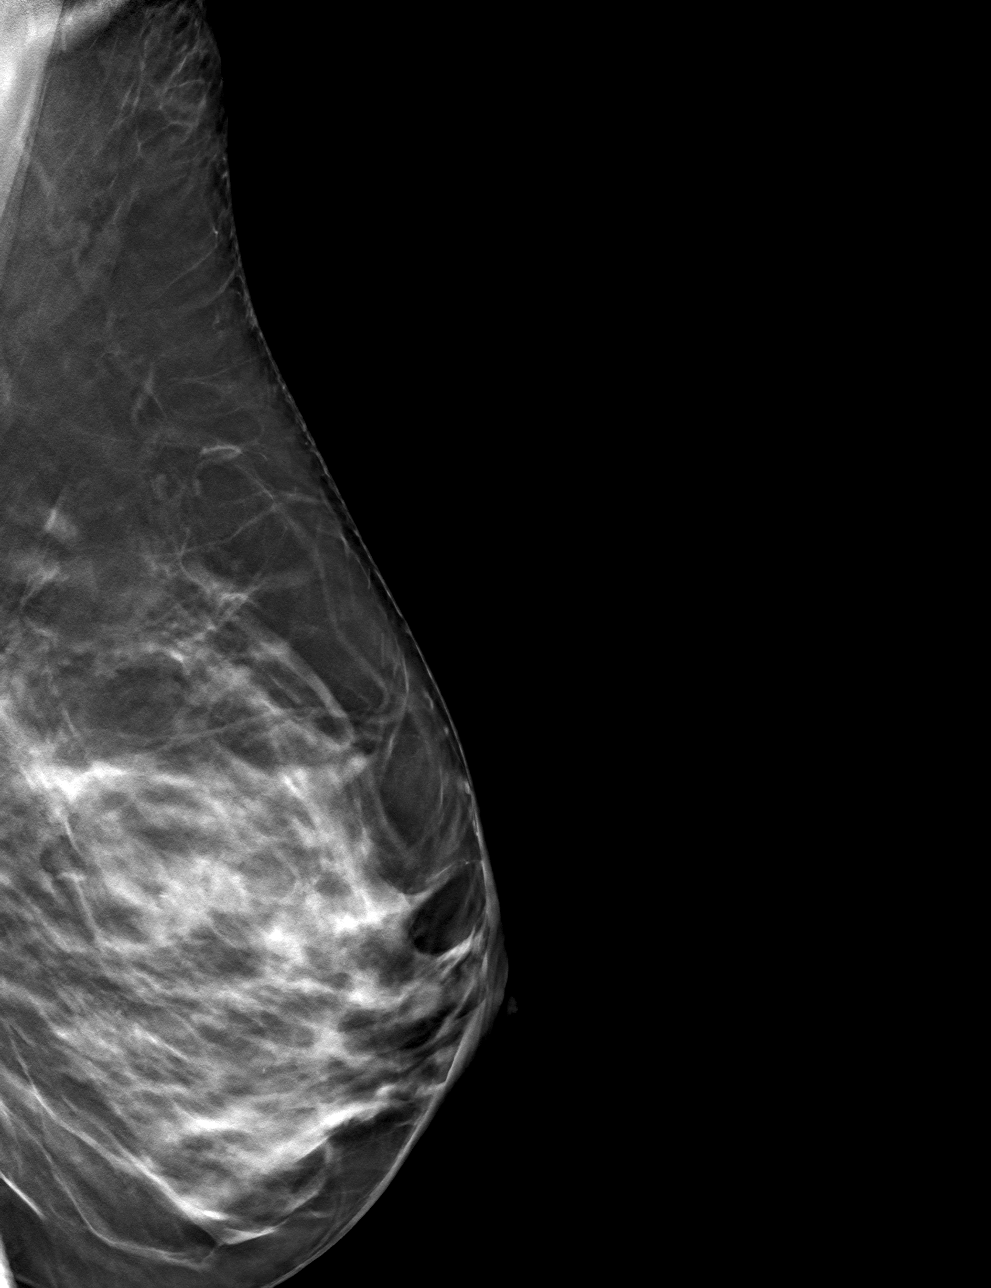

[4 of 12 positions shown; findings below may reference images not displayed]

FINDINGS: 3D Mammographic images were obtained following 3D/stereotactic
guided biopsy of 0.8 cm group of OUTER LEFT breast calcifications.
The X biopsy marking clip is in expected position at the site of
biopsy.
IMPRESSION: Appropriate positioning of the X shaped biopsy marking clip at the
site of biopsy in the OUTER LEFT breast.

Final Assessment: Post Procedure Mammograms for Marker Placement

## 2021-12-12 ENCOUNTER — Encounter: Payer: Self-pay | Admitting: Internal Medicine

## 2021-12-12 ENCOUNTER — Ambulatory Visit: Payer: BC Managed Care – PPO | Admitting: Internal Medicine

## 2021-12-12 VITALS — BP 120/80 | HR 80 | Temp 99.1°F | Wt 188.3 lb

## 2021-12-12 DIAGNOSIS — E669 Obesity, unspecified: Secondary | ICD-10-CM

## 2021-12-12 DIAGNOSIS — G4700 Insomnia, unspecified: Secondary | ICD-10-CM | POA: Diagnosis not present

## 2021-12-12 DIAGNOSIS — I10 Essential (primary) hypertension: Secondary | ICD-10-CM

## 2021-12-12 DIAGNOSIS — E782 Mixed hyperlipidemia: Secondary | ICD-10-CM

## 2021-12-12 DIAGNOSIS — F411 Generalized anxiety disorder: Secondary | ICD-10-CM

## 2021-12-12 LAB — COMPREHENSIVE METABOLIC PANEL
ALT: 23 U/L (ref 0–35)
AST: 16 U/L (ref 0–37)
Albumin: 4.4 g/dL (ref 3.5–5.2)
Alkaline Phosphatase: 54 U/L (ref 39–117)
BUN: 17 mg/dL (ref 6–23)
CO2: 26 mEq/L (ref 19–32)
Calcium: 9.4 mg/dL (ref 8.4–10.5)
Chloride: 105 mEq/L (ref 96–112)
Creatinine, Ser: 0.73 mg/dL (ref 0.40–1.20)
GFR: 101.11 mL/min (ref >60.00)
Glucose, Bld: 90 mg/dL (ref 70–99)
Potassium: 3.5 mEq/L (ref 3.5–5.1)
Sodium: 140 mEq/L (ref 135–145)
Total Bilirubin: 0.4 mg/dL (ref 0.2–1.2)
Total Protein: 7.2 g/dL (ref 6.0–8.3)

## 2021-12-12 LAB — LIPID PANEL
Cholesterol: 175 mg/dL (ref 0–200)
HDL: 61.2 mg/dL (ref >39.00)
LDL Cholesterol: 84 mg/dL (ref 0–99)
NonHDL: 113.31
Total CHOL/HDL Ratio: 3
Triglycerides: 148 mg/dL (ref 0.0–149.0)
VLDL: 29.6 mg/dL (ref 0.0–40.0)

## 2021-12-12 NOTE — Progress Notes (Signed)
Established Patient Office Visit     CC/Reason for Visit: Follow-up chronic conditions, discuss acute concerns  HPI: Alexis Craig is a 41 y.o. female who is coming in today for the above mentioned reasons. Past Medical History is significant for: Hypertension, hyperlipidemia, generalized anxiety disorder.  She has been taking all medications as ordered tolerating them well.  She is due to have lipids rechecked today.  She is concerned about continuous weight gain.  She would also like to discuss her insomnia.  She feels like it is intermittent, some nights she will sleep well but others only a few hours.  She feels like this does not affect her quality of life and she is able to perform all job and family functions well.   Past Medical/Surgical History: Past Medical History:  Diagnosis Date   Anal fissure 04/09/2009   ANXIETY 03/09/2008   Depression    GERD (gastroesophageal reflux disease)    Gestational diabetes    metformin   History of alcohol abuse    7 years ago   Hx of varicella    INSOMNIA 10/05/2007   INTENTION TREMOR 06/13/2008   NEPHROLITHIASIS, HX OF 08/23/2007   passed stone, no surgery   RECTAL BLEEDING 04/09/2009   RESTLESS LEG SYNDROME 01/22/2009    Past Surgical History:  Procedure Laterality Date   DILATION AND EVACUATION N/A 03/31/2019   Procedure: DILATATION AND EVACUATION with genetic studies;  Surgeon: Louretta Shorten, MD;  Location: [place];  Service: Gynecology;  Laterality: N/A;  with genetic studies   EXCISIONAL HEMORRHOIDECTOMY     KIDNEY STONE SURGERY Right 07/2020   KIDNEY STONE SURGERY Left 08/2020   WISDOM TOOTH EXTRACTION      Social History:  reports that she quit smoking about 10 years ago. Her smoking use included cigarettes. She has a 6.00 pack-year smoking history. She has never used smokeless tobacco. She reports that she does not drink alcohol and does not use drugs.  Allergies: Allergies  Allergen Reactions   Nsaids Rash     Blisters also   Ibuprofen     Fixed drug reaction with bullous dermatitis    Family History:  Family History  Problem Relation Age of Onset   Diabetes Mother    Thyroid disease Mother    Cancer Mother    Heart disease Mother    Hyperlipidemia Father    Diabetes Father    Heart disease Maternal Uncle    Cancer Maternal Grandmother    Heart disease Paternal Grandfather      Current Outpatient Medications:    acetaminophen (TYLENOL) 500 MG tablet, Take 500 mg by mouth every 6 (six) hours as needed for moderate pain. , Disp: , Rfl:    ALPRAZolam (XANAX) 0.5 MG tablet, Take 1 tablet (0.5 mg total) by mouth daily as needed for anxiety (for airplane travel)., Disp: 20 tablet, Rfl: 0   amLODipine (NORVASC) 5 MG tablet, TAKE 1 TABLET(5 MG) BY MOUTH DAILY, Disp: 90 tablet, Rfl: 1   atorvastatin (LIPITOR) 40 MG tablet, TAKE 1 TABLET(40 MG) BY MOUTH DAILY, Disp: 90 tablet, Rfl: 1   cholecalciferol (VITAMIN D3) 25 MCG (1000 UT) tablet, Take 1,000 Units by mouth daily., Disp: , Rfl:    hydrochlorothiazide (HYDRODIURIL) 25 MG tablet, TAKE 1 TABLET(25 MG) BY MOUTH DAILY, Disp: 90 tablet, Rfl: 1   metFORMIN (GLUCOPHAGE-XR) 750 MG 24 hr tablet, Take 750 mg by mouth 2 (two) times daily., Disp: , Rfl:    omeprazole (PRILOSEC OTC)  20 MG tablet, Take 20 mg by mouth daily., Disp: , Rfl:    pramipexole (MIRAPEX) 0.5 MG tablet, TAKE 1 TABLET(0.5 MG) BY MOUTH AT BEDTIME AS NEEDED, Disp: 90 tablet, Rfl: 1   Prenatal Vit-Fe Fumarate-FA (PRENATAL MULTIVITAMIN) TABS tablet, Take 1 tablet by mouth daily at 12 noon., Disp: , Rfl:    sertraline (ZOLOFT) 100 MG tablet, TAKE 1 TABLET(100 MG) BY MOUTH DAILY, Disp: 90 tablet, Rfl: 1  Review of Systems:  Constitutional: Denies fever, chills, diaphoresis, appetite change and fatigue.  HEENT: Denies photophobia, eye pain, redness, hearing loss, ear pain, congestion, sore throat, rhinorrhea, sneezing, mouth sores, trouble swallowing, neck pain, neck stiffness and  tinnitus.   Respiratory: Denies SOB, DOE, cough, chest tightness,  and wheezing.   Cardiovascular: Denies chest pain, palpitations and leg swelling.  Gastrointestinal: Denies nausea, vomiting, abdominal pain, diarrhea, constipation, blood in stool and abdominal distention.  Genitourinary: Denies dysuria, urgency, frequency, hematuria, flank pain and difficulty urinating.  Endocrine: Denies: hot or cold intolerance, sweats, changes in hair or nails, polyuria, polydipsia. Musculoskeletal: Denies myalgias, back pain, joint swelling, arthralgias and gait problem.  Skin: Denies pallor, rash and wound.  Neurological: Denies dizziness, seizures, syncope, weakness, light-headedness, numbness and headaches.  Hematological: Denies adenopathy. Easy bruising, personal or family bleeding history  Psychiatric/Behavioral: Denies suicidal ideation, mood changes, confusion, nervousness and agitation    Physical Exam: Vitals:   12/12/21 0834  BP: 120/80  Pulse: 80  Temp: 99.1 F (37.3 C)  TempSrc: Oral  SpO2: 98%  Weight: 188 lb 4.8 oz (85.4 kg)    Body mass index is 33.36 kg/m.   Constitutional: NAD, calm, comfortable Eyes: PERRL, lids and conjunctivae normal ENMT: Mucous membranes are moist.  Respiratory: clear to auscultation bilaterally, no wheezing, no crackles. Normal respiratory effort. No accessory muscle use.  Cardiovascular: Regular rate and rhythm, no murmurs / rubs / gallops. No extremity edema.  Neurologic: Grossly intact and nonfocal Psychiatric: Normal judgment and insight. Alert and oriented x 3. Normal mood.    Impression and Plan:  Primary hypertension  - Plan: Comprehensive metabolic panel, Comprehensive metabolic panel -Excellent control on amlodipine 5 mg daily and hydrochlorothiazide 25 mg daily.  Mixed hyperlipidemia  - Plan: Lipid panel, Lipid panel -Continue atorvastatin 40 mg daily.  Anxiety state [place] Office Visit from 06/13/2021 in [facility] [place]  PHQ-9 Total Score 1     -Well-controlled on sertraline.   Insomnia, unspecified type -We have discussed sleep hygiene in great detail, I am not fully opposed to prescribing a sleeping aid but would like to try other issues first.    Time spent:33 minutes reviewing chart, interviewing and examining patient and formulating plan of care.     Lelon Frohlich, MD [facility] at [facility]

## 2022-02-19 ENCOUNTER — Other Ambulatory Visit: Payer: Self-pay | Admitting: Internal Medicine

## 2022-02-19 DIAGNOSIS — F411 Generalized anxiety disorder: Secondary | ICD-10-CM

## 2022-02-19 DIAGNOSIS — I10 Essential (primary) hypertension: Secondary | ICD-10-CM

## 2022-02-19 DIAGNOSIS — E782 Mixed hyperlipidemia: Secondary | ICD-10-CM

## 2022-02-25 ENCOUNTER — Encounter: Payer: Self-pay | Admitting: Internal Medicine

## 2022-02-25 ENCOUNTER — Encounter: Payer: Self-pay | Admitting: Plastic Surgery

## 2022-02-25 ENCOUNTER — Ambulatory Visit: Payer: BC Managed Care – PPO | Admitting: Internal Medicine

## 2022-02-25 ENCOUNTER — Ambulatory Visit (INDEPENDENT_AMBULATORY_CARE_PROVIDER_SITE_OTHER): Payer: BC Managed Care – PPO | Admitting: Plastic Surgery

## 2022-02-25 VITALS — BP 132/85 | HR 90 | Ht 63.0 in | Wt 195.8 lb

## 2022-02-25 VITALS — BP 118/74 | HR 85 | Temp 98.3°F | Wt 196.5 lb

## 2022-02-25 DIAGNOSIS — L989 Disorder of the skin and subcutaneous tissue, unspecified: Secondary | ICD-10-CM

## 2022-02-25 DIAGNOSIS — Z23 Encounter for immunization: Secondary | ICD-10-CM | POA: Diagnosis not present

## 2022-02-25 DIAGNOSIS — E559 Vitamin D deficiency, unspecified: Secondary | ICD-10-CM

## 2022-02-25 DIAGNOSIS — I10 Essential (primary) hypertension: Secondary | ICD-10-CM | POA: Diagnosis not present

## 2022-02-25 DIAGNOSIS — R252 Cramp and spasm: Secondary | ICD-10-CM | POA: Diagnosis not present

## 2022-02-25 DIAGNOSIS — E782 Mixed hyperlipidemia: Secondary | ICD-10-CM | POA: Diagnosis not present

## 2022-02-25 LAB — CBC WITH DIFFERENTIAL/PLATELET
Basophils Absolute: 0 10*3/uL (ref 0.0–0.1)
Basophils Relative: 0.5 % (ref 0.0–3.0)
Eosinophils Absolute: 0.1 10*3/uL (ref 0.0–0.7)
Eosinophils Relative: 1.5 % (ref 0.0–5.0)
HCT: 39 % (ref 36.0–46.0)
Hemoglobin: 13.1 g/dL (ref 12.0–15.0)
Lymphocytes Relative: 29.5 % (ref 12.0–46.0)
Lymphs Abs: 2.6 10*3/uL (ref 0.7–4.0)
MCHC: 33.5 g/dL (ref 30.0–36.0)
MCV: 83.4 fl (ref 78.0–100.0)
Monocytes Absolute: 0.8 10*3/uL (ref 0.1–1.0)
Monocytes Relative: 8.9 % (ref 3.0–12.0)
Neutro Abs: 5.2 10*3/uL (ref 1.4–7.7)
Neutrophils Relative %: 59.6 % (ref 43.0–77.0)
Platelets: 351 10*3/uL (ref 150.0–400.0)
RBC: 4.68 Mil/uL (ref 3.87–5.11)
RDW: 15.1 % (ref 11.5–15.5)
WBC: 8.7 10*3/uL (ref 4.0–10.5)

## 2022-02-25 LAB — COMPREHENSIVE METABOLIC PANEL
ALT: 17 U/L (ref 0–35)
AST: 15 U/L (ref 0–37)
Albumin: 4.5 g/dL (ref 3.5–5.2)
Alkaline Phosphatase: 57 U/L (ref 39–117)
BUN: 29 mg/dL — ABNORMAL HIGH (ref 6–23)
CO2: 24 mEq/L (ref 19–32)
Calcium: 9.8 mg/dL (ref 8.4–10.5)
Chloride: 101 mEq/L (ref 96–112)
Creatinine, Ser: 0.78 mg/dL (ref 0.40–1.20)
GFR: 93.25 mL/min (ref >60.00)
Glucose, Bld: 97 mg/dL (ref 70–99)
Potassium: 3.1 mEq/L — ABNORMAL LOW (ref 3.5–5.1)
Sodium: 137 mEq/L (ref 135–145)
Total Bilirubin: 0.3 mg/dL (ref 0.2–1.2)
Total Protein: 7.6 g/dL (ref 6.0–8.3)

## 2022-02-25 LAB — MAGNESIUM: Magnesium: 1.7 mg/dL (ref 1.5–2.5)

## 2022-02-25 LAB — VITAMIN D 25 HYDROXY (VIT D DEFICIENCY, FRACTURES): VITD: 37.63 ng/mL (ref 30.00–100.00)

## 2022-02-25 LAB — IBC + FERRITIN
Ferritin: 14 ng/mL (ref 10.0–291.0)
Iron: 65 ug/dL (ref 42–145)
Saturation Ratios: 13.7 % — ABNORMAL LOW (ref 20.0–50.0)
TIBC: 474.6 ug/dL — ABNORMAL HIGH (ref 250.0–450.0)
Transferrin: 339 mg/dL (ref 212.0–360.0)

## 2022-02-25 NOTE — Progress Notes (Signed)
Established Patient Office Visit     CC/Reason for Visit: Discuss leg cramps  HPI: Alexis Craig is a 41 y.o. female who is coming in today for the above mentioned reasons. Past Medical History is significant for: Hypertension, hyperlipidemia, generalized anxiety disorder, vitamin D deficiency, restless leg syndrome.  She has been diagnosed with restless legs for a few years and is on Mirapex 0.5 mg at bedtime.  For the past months she has noticed an increase in her leg cramping and pain at nighttime.  No recent changes to medication.   Past Medical/Surgical History: Past Medical History:  Diagnosis Date   Anal fissure 04/09/2009   ANXIETY 03/09/2008   Depression    GERD (gastroesophageal reflux disease)    Gestational diabetes    metformin   History of alcohol abuse    7 years ago   Hx of varicella    INSOMNIA 10/05/2007   INTENTION TREMOR 06/13/2008   NEPHROLITHIASIS, HX OF 08/23/2007   passed stone, no surgery   RECTAL BLEEDING 04/09/2009   RESTLESS LEG SYNDROME 01/22/2009    Past Surgical History:  Procedure Laterality Date   DILATION AND EVACUATION N/A 03/31/2019   Procedure: DILATATION AND EVACUATION with genetic studies;  Surgeon: Louretta Shorten, MD;  Location: [facility];  Service: Gynecology;  Laterality: N/A;  with genetic studies   EXCISIONAL HEMORRHOIDECTOMY     KIDNEY STONE SURGERY Right 07/2020   KIDNEY STONE SURGERY Left 08/2020   WISDOM TOOTH EXTRACTION      Social History:  reports that she quit smoking about 10 years ago. Her smoking use included cigarettes. She has a 6.00 pack-year smoking history. She has never used smokeless tobacco. She reports that she does not drink alcohol and does not use drugs.  Allergies: Allergies  Allergen Reactions   Nsaids Rash    Blisters also   Ibuprofen     Fixed drug reaction with bullous dermatitis    Family History:  Family History  Problem Relation Age of Onset   Diabetes Mother    Thyroid disease Mother     Cancer Mother    Heart disease Mother    Hyperlipidemia Father    Diabetes Father    Heart disease Maternal Uncle    Cancer Maternal Grandmother    Heart disease Paternal Grandfather      Current Outpatient Medications:    acetaminophen (TYLENOL) 500 MG tablet, Take 500 mg by mouth every 6 (six) hours as needed for moderate pain. , Disp: , Rfl:    ALPRAZolam (XANAX) 0.5 MG tablet, Take 1 tablet (0.5 mg total) by mouth daily as needed for anxiety (for airplane travel)., Disp: 20 tablet, Rfl: 0   amLODipine (NORVASC) 5 MG tablet, TAKE 1 TABLET(5 MG) BY MOUTH DAILY, Disp: 90 tablet, Rfl: 1   atorvastatin (LIPITOR) 40 MG tablet, TAKE 1 TABLET(40 MG) BY MOUTH DAILY, Disp: 90 tablet, Rfl: 1   cholecalciferol (VITAMIN D3) 25 MCG (1000 UT) tablet, Take 1,000 Units by mouth daily., Disp: , Rfl:    hydrochlorothiazide (HYDRODIURIL) 25 MG tablet, TAKE 1 TABLET(25 MG) BY MOUTH DAILY, Disp: 90 tablet, Rfl: 1   metFORMIN (GLUCOPHAGE-XR) 750 MG 24 hr tablet, Take 750 mg by mouth 2 (two) times daily., Disp: , Rfl:    omeprazole (PRILOSEC OTC) 20 MG tablet, Take 20 mg by mouth daily., Disp: , Rfl:    pramipexole (MIRAPEX) 0.5 MG tablet, TAKE 1 TABLET(0.5 MG) BY MOUTH AT BEDTIME AS NEEDED, Disp: 90 tablet, Rfl:  1   Prenatal Vit-Fe Fumarate-FA (PRENATAL MULTIVITAMIN) TABS tablet, Take 1 tablet by mouth daily at 12 noon., Disp: , Rfl:    sertraline (ZOLOFT) 100 MG tablet, TAKE 1 TABLET(100 MG) BY MOUTH DAILY, Disp: 90 tablet, Rfl: 1  Review of Systems:  Constitutional: Denies fever, chills, diaphoresis, appetite change and fatigue.  HEENT: Denies photophobia, eye pain, redness, hearing loss, ear pain, congestion, sore throat, rhinorrhea, sneezing, mouth sores, trouble swallowing, neck pain, neck stiffness and tinnitus.   Respiratory: Denies SOB, DOE, cough, chest tightness,  and wheezing.   Cardiovascular: Denies chest pain, palpitations and leg swelling.  Gastrointestinal: Denies nausea, vomiting,  abdominal pain, diarrhea, constipation, blood in stool and abdominal distention.  Genitourinary: Denies dysuria, urgency, frequency, hematuria, flank pain and difficulty urinating.  Endocrine: Denies: hot or cold intolerance, sweats, changes in hair or nails, polyuria, polydipsia. Musculoskeletal: Denies myalgias, back pain, joint swelling, arthralgias and gait problem.  Skin: Denies pallor, rash and wound.  Neurological: Denies dizziness, seizures, syncope, weakness, light-headedness, numbness and headaches.  Hematological: Denies adenopathy. Easy bruising, personal or family bleeding history  Psychiatric/Behavioral: Denies suicidal ideation, mood changes, confusion, nervousness, sleep disturbance and agitation    Physical Exam: Vitals:   02/25/22 1138  BP: 118/74  Pulse: 85  Temp: 98.3 F (36.8 C)  TempSrc: Oral  SpO2: 99%  Weight: 196 lb 8 oz (89.1 kg)    Body mass index is 34.81 kg/m.   Constitutional: NAD, calm, comfortable Eyes: PERRL, lids and conjunctivae normal ENMT: Mucous membranes are moist. Psychiatric: Normal judgment and insight. Alert and oriented x 3. Normal mood.    Impression and Plan:  Bilateral leg cramps - Plan: Magnesium, Comprehensive metabolic panel  Needs flu shot - Plan: Flu Vaccine QUAD 6+ mos PF IM (Fluarix Quad PF)  Primary hypertension - Plan: CBC with Differential/Platelet  Mixed hyperlipidemia  Vitamin D deficiency - Plan: VITAMIN D 25 Hydroxy (Vit-D Deficiency, Fractures)  -Differential diagnosis includes electrolyte abnormalities especially as she is on a diuretic, side effect of her statin, worsening of her restless leg syndrome is also a possibility.  Restless leg syndrome has been associated with iron deficiency anemia so we will check iron studies as well. -She has also been evaluated for vein varicosities, it is possible that her leg cramps might be related to this as well. -If labs are normal we will ask her to do a trial of  statin discontinuation.  If no improvement with that we can increase pramipexole dosage.  She has already been evaluated by a vein specialist.  -Flu vaccine has been administered today.   Time spent:31 minutes reviewing chart, interviewing and examining patient and formulating plan of care.    Lelon Frohlich, MD [facility]

## 2022-02-25 NOTE — Progress Notes (Signed)
Patient ID: Alexis Craig, female    DOB: July 04, 1980, 41 y.o.   MRN: [id number]   Chief Complaint  Patient presents with   Consult   Skin Problem    The patient is a 41 year old female here for evaluation of her face and changing skin lesions.  She has had lesions removed in the past that have been questionable.  She has a hypopigmented raised lesion on her left neck.  It is 1.3 cm in size.  It has been getting bigger and getting caught on her close.  There is 1 on the left cheek that is a centimeter in size.  It is raised and has been excised in the past.  It has come back and has ring around it with constant hairs that get ingrown.  It is flesh-colored.  She has a 3 mm raised reddish colored lesion on the tip of her nose.  That seems to be getting larger as well.  She is otherwise in good health and does not have any other lesions that she wants evaluated at this time.  She does have her own dermatologist that reviews her skin checks yearly.    Review of Systems  Constitutional: Negative.   Eyes: Negative.   Respiratory: Negative.  Negative for chest tightness and shortness of breath.   Cardiovascular: Negative.   Gastrointestinal: Negative.   Endocrine: Negative.   Genitourinary: Negative.   Musculoskeletal: Negative.   Skin: Negative.  Negative for wound.  Psychiatric/Behavioral: Negative.      Past Medical History:  Diagnosis Date   Anal fissure 04/09/2009   ANXIETY 03/09/2008   Depression    GERD (gastroesophageal reflux disease)    Gestational diabetes    metformin   History of alcohol abuse    7 years ago   Hx of varicella    INSOMNIA 10/05/2007   INTENTION TREMOR 06/13/2008   NEPHROLITHIASIS, HX OF 08/23/2007   passed stone, no surgery   RECTAL BLEEDING 04/09/2009   RESTLESS LEG SYNDROME 01/22/2009    Past Surgical History:  Procedure Laterality Date   DILATION AND EVACUATION N/A 03/31/2019   Procedure: DILATATION AND EVACUATION with genetic studies;   Surgeon: Louretta Shorten, MD;  Location: [facility];  Service: Gynecology;  Laterality: N/A;  with genetic studies   EXCISIONAL HEMORRHOIDECTOMY     KIDNEY STONE SURGERY Right 07/2020   KIDNEY STONE SURGERY Left 08/2020   WISDOM TOOTH EXTRACTION        Current Outpatient Medications:    acetaminophen (TYLENOL) 500 MG tablet, Take 500 mg by mouth every 6 (six) hours as needed for moderate pain. , Disp: , Rfl:    ALPRAZolam (XANAX) 0.5 MG tablet, Take 1 tablet (0.5 mg total) by mouth daily as needed for anxiety (for airplane travel)., Disp: 20 tablet, Rfl: 0   amLODipine (NORVASC) 5 MG tablet, TAKE 1 TABLET(5 MG) BY MOUTH DAILY, Disp: 90 tablet, Rfl: 1   atorvastatin (LIPITOR) 40 MG tablet, TAKE 1 TABLET(40 MG) BY MOUTH DAILY, Disp: 90 tablet, Rfl: 1   cholecalciferol (VITAMIN D3) 25 MCG (1000 UT) tablet, Take 1,000 Units by mouth daily., Disp: , Rfl:    hydrochlorothiazide (HYDRODIURIL) 25 MG tablet, TAKE 1 TABLET(25 MG) BY MOUTH DAILY, Disp: 90 tablet, Rfl: 1   metFORMIN (GLUCOPHAGE-XR) 750 MG 24 hr tablet, Take 750 mg by mouth 2 (two) times daily., Disp: , Rfl:    omeprazole (PRILOSEC OTC) 20 MG tablet, Take 20 mg by mouth daily., Disp: , Rfl:  pramipexole (MIRAPEX) 0.5 MG tablet, TAKE 1 TABLET(0.5 MG) BY MOUTH AT BEDTIME AS NEEDED, Disp: 90 tablet, Rfl: 1   Prenatal Vit-Fe Fumarate-FA (PRENATAL MULTIVITAMIN) TABS tablet, Take 1 tablet by mouth daily at 12 noon., Disp: , Rfl:    sertraline (ZOLOFT) 100 MG tablet, TAKE 1 TABLET(100 MG) BY MOUTH DAILY, Disp: 90 tablet, Rfl: 1   Objective:   Vitals:   02/25/22 0934  BP: 132/85  Pulse: 90  SpO2: 98%    Physical Exam Vitals and nursing note reviewed.  Constitutional:      Appearance: Normal appearance.  HENT:     Head: Normocephalic and atraumatic.  Cardiovascular:     Rate and Rhythm: Normal rate.     Pulses: Normal pulses.  Pulmonary:     Effort: Pulmonary effort is normal.  Musculoskeletal:        General: No swelling or deformity.   Skin:    General: Skin is warm.     Capillary Refill: Capillary refill takes less than 2 seconds.     Coloration: Skin is not jaundiced.     Findings: No bruising.  Neurological:     Mental Status: She is alert and oriented to person, place, and time.  Psychiatric:        Mood and Affect: Mood normal.        Behavior: Behavior normal.        Judgment: Judgment normal.     Assessment & Plan:  Changing skin lesion  Plan for excision of changing skin lesions in the office.  This will include the left cheek left neck and nose.  Patient is aware that there will be scars.  Pictures were obtained of the patient and placed in the chart with the patient's or guardian's permission.   [place], DO

## 2022-02-26 ENCOUNTER — Other Ambulatory Visit: Payer: Self-pay | Admitting: *Deleted

## 2022-02-26 DIAGNOSIS — E876 Hypokalemia: Secondary | ICD-10-CM

## 2022-02-26 MED ORDER — POTASSIUM CHLORIDE CRYS ER 20 MEQ PO TBCR: 20.0000 meq | EXTENDED_RELEASE_TABLET | Freq: Every day | ORAL | 0 refills | Status: DC

## 2022-03-05 ENCOUNTER — Other Ambulatory Visit: Payer: Self-pay | Admitting: Internal Medicine

## 2022-03-05 ENCOUNTER — Other Ambulatory Visit (INDEPENDENT_AMBULATORY_CARE_PROVIDER_SITE_OTHER): Payer: BC Managed Care – PPO

## 2022-03-05 DIAGNOSIS — E876 Hypokalemia: Secondary | ICD-10-CM

## 2022-03-05 LAB — BASIC METABOLIC PANEL
BUN: 17 mg/dL (ref 6–23)
CO2: 26 mEq/L (ref 19–32)
Calcium: 9.5 mg/dL (ref 8.4–10.5)
Chloride: 101 mEq/L (ref 96–112)
Creatinine, Ser: 0.7 mg/dL (ref 0.40–1.20)
GFR: 106.16 mL/min (ref >60.00)
Glucose, Bld: 122 mg/dL — ABNORMAL HIGH (ref 70–99)
Potassium: 3.3 mEq/L — ABNORMAL LOW (ref 3.5–5.1)
Sodium: 137 mEq/L (ref 135–145)

## 2022-03-05 MED ORDER — POTASSIUM CHLORIDE CRYS ER 20 MEQ PO TBCR: 20.0000 meq | EXTENDED_RELEASE_TABLET | Freq: Two times a day (BID) | ORAL | 0 refills | Status: DC

## 2022-03-06 NOTE — Addendum Note (Signed)
Addended by: [name] B on: 03/06/2022 11:38 AM   Modules accepted: Orders

## 2022-03-07 ENCOUNTER — Encounter: Payer: Self-pay | Admitting: Internal Medicine

## 2022-03-08 ENCOUNTER — Encounter (HOSPITAL_COMMUNITY): Payer: Self-pay

## 2022-03-08 ENCOUNTER — Emergency Department (HOSPITAL_COMMUNITY)
Admission: EM | Admit: 2022-03-08 | Discharge: 2022-03-09 | Disposition: A | Discharge status: Home / Self Care | Payer: BC Managed Care – PPO | Attending: Emergency Medicine | Admitting: Emergency Medicine

## 2022-03-08 ENCOUNTER — Other Ambulatory Visit: Payer: Self-pay

## 2022-03-08 ENCOUNTER — Emergency Department (HOSPITAL_COMMUNITY): Payer: BC Managed Care – PPO

## 2022-03-08 DIAGNOSIS — R519 Headache, unspecified: Secondary | ICD-10-CM | POA: Diagnosis not present

## 2022-03-08 DIAGNOSIS — R42 Dizziness and giddiness: Secondary | ICD-10-CM | POA: Diagnosis not present

## 2022-03-08 DIAGNOSIS — Z79899 Other long term (current) drug therapy: Secondary | ICD-10-CM | POA: Insufficient documentation

## 2022-03-08 DIAGNOSIS — G43909 Migraine, unspecified, not intractable, without status migrainosus: Secondary | ICD-10-CM | POA: Diagnosis not present

## 2022-03-08 DIAGNOSIS — Z7984 Long term (current) use of oral hypoglycemic drugs: Secondary | ICD-10-CM | POA: Diagnosis not present

## 2022-03-08 DIAGNOSIS — Z87891 Personal history of nicotine dependence: Secondary | ICD-10-CM | POA: Diagnosis not present

## 2022-03-08 DIAGNOSIS — G43109 Migraine with aura, not intractable, without status migrainosus: Secondary | ICD-10-CM | POA: Diagnosis not present

## 2022-03-08 DIAGNOSIS — R4781 Slurred speech: Secondary | ICD-10-CM | POA: Diagnosis not present

## 2022-03-08 DIAGNOSIS — H538 Other visual disturbances: Secondary | ICD-10-CM | POA: Diagnosis not present

## 2022-03-08 LAB — I-STAT CHEM 8, ED
BUN: 12 mg/dL (ref 6–20)
Calcium, Ion: 1.01 mmol/L — ABNORMAL LOW (ref 1.15–1.40)
Chloride: 103 mmol/L (ref 98–111)
Creatinine, Ser: 0.6 mg/dL (ref 0.44–1.00)
Glucose, Bld: 105 mg/dL — ABNORMAL HIGH (ref 70–99)
HCT: 39 % (ref 36.0–46.0)
Hemoglobin: 13.3 g/dL (ref 12.0–15.0)
Potassium: 3.1 mmol/L — ABNORMAL LOW (ref 3.5–5.1)
Sodium: 137 mmol/L (ref 135–145)
TCO2: 24 mmol/L (ref 22–32)

## 2022-03-08 LAB — PROTIME-INR
INR: 1 (ref 0.8–1.2)
Prothrombin Time: 13 seconds (ref 11.4–15.2)

## 2022-03-08 LAB — COMPREHENSIVE METABOLIC PANEL
ALT: 27 U/L (ref 0–44)
AST: 19 U/L (ref 15–41)
Albumin: 4 g/dL (ref 3.5–5.0)
Alkaline Phosphatase: 52 U/L (ref 38–126)
Anion gap: 13 (ref 5–15)
BUN: 13 mg/dL (ref 6–20)
CO2: 24 mmol/L (ref 22–32)
Calcium: 9.3 mg/dL (ref 8.9–10.3)
Chloride: 102 mmol/L (ref 98–111)
Creatinine, Ser: 0.71 mg/dL (ref 0.44–1.00)
GFR, Estimated: >60 mL/min (ref >60)
Glucose, Bld: 108 mg/dL — ABNORMAL HIGH (ref 70–99)
Potassium: 3.3 mmol/L — ABNORMAL LOW (ref 3.5–5.1)
Sodium: 139 mmol/L (ref 135–145)
Total Bilirubin: 0.4 mg/dL (ref 0.3–1.2)
Total Protein: 6.8 g/dL (ref 6.5–8.1)

## 2022-03-08 LAB — CBC
HCT: 38.3 % (ref 36.0–46.0)
Hemoglobin: 12.6 g/dL (ref 12.0–15.0)
MCH: 28 pg (ref 26.0–34.0)
MCHC: 32.9 g/dL (ref 30.0–36.0)
MCV: 85.1 fL (ref 80.0–100.0)
Platelets: 356 10*3/uL (ref 150–400)
RBC: 4.5 MIL/uL (ref 3.87–5.11)
RDW: 13.9 % (ref 11.5–15.5)
WBC: 11.4 10*3/uL — ABNORMAL HIGH (ref 4.0–10.5)
nRBC: 0 % (ref 0.0–0.2)

## 2022-03-08 LAB — DIFFERENTIAL
Abs Immature Granulocytes: 0.06 10*3/uL (ref 0.00–0.07)
Basophils Absolute: 0 10*3/uL (ref 0.0–0.1)
Basophils Relative: 0 %
Eosinophils Absolute: 0.2 10*3/uL (ref 0.0–0.5)
Eosinophils Relative: 2 %
Immature Granulocytes: 1 %
Lymphocytes Relative: 25 %
Lymphs Abs: 2.9 10*3/uL (ref 0.7–4.0)
Monocytes Absolute: 1 10*3/uL (ref 0.1–1.0)
Monocytes Relative: 9 %
Neutro Abs: 7.2 10*3/uL (ref 1.7–7.7)
Neutrophils Relative %: 63 %

## 2022-03-08 LAB — I-STAT BETA HCG BLOOD, ED (MC, WL, AP ONLY): I-stat hCG, quantitative: <5 m[IU]/mL (ref <5)

## 2022-03-08 LAB — CBG MONITORING, ED
Glucose-Capillary: 112 mg/dL — ABNORMAL HIGH (ref 70–99)
Glucose-Capillary: 91 mg/dL (ref 70–99)

## 2022-03-08 LAB — ETHANOL: Alcohol, Ethyl (B): <10 mg/dL (ref <10)

## 2022-03-08 LAB — APTT: aPTT: 26 seconds (ref 24–36)

## 2022-03-08 MED ORDER — POTASSIUM CHLORIDE CRYS ER 20 MEQ PO TBCR
40.0000 meq | EXTENDED_RELEASE_TABLET | Freq: Once | ORAL | Status: AC
  Administered 2022-03-08: 40 meq via ORAL
  Filled 2022-03-08: qty 2

## 2022-03-08 MED ORDER — SODIUM CHLORIDE 0.9 % IV BOLUS: 1000.0000 mL | Freq: Once | INTRAVENOUS | Status: DC

## 2022-03-08 MED ORDER — SODIUM CHLORIDE 0.9% FLUSH: 3.0000 mL | Freq: Once | INTRAVENOUS | Status: DC

## 2022-03-08 MED ORDER — MAGNESIUM OXIDE -MG SUPPLEMENT 400 (240 MG) MG PO TABS
400.0000 mg | ORAL_TABLET | Freq: Once | ORAL | Status: AC
Start: 2022-03-08 — End: 2022-03-08
  Administered 2022-03-08: 400 mg via ORAL
  Filled 2022-03-08: qty 1

## 2022-03-08 NOTE — ED Triage Notes (Signed)
Patient was eating dinner and at 1745 patient developed blurred vision headache, nausea and slurred speech.  Reports she doesn't feel she can gather her thoughts.  No facial droop or weakness noted.  Slurred speech has resolved.Hx of migraines

## 2022-03-08 NOTE — Discharge Instructions (Signed)
It was a pleasure caring for you today in the emergency department. ° °Please return to the emergency department for any worsening or worrisome symptoms. ° ° °

## 2022-03-08 NOTE — ED Notes (Signed)
Patient triaged by this RN.

## 2022-03-08 NOTE — ED Provider Triage Note (Signed)
Emergency Medicine Provider Triage Evaluation Note  Alexis Craig , a 41 y.o. female  was evaluated in triage.  Pt complains of dizziness.  Pt developed slurred speech and blurred vision around 1745.  Pt nearly passed out when standing up.  Pt is feeling better now.  LVO neg.   Review of Systems  Positive: + dizziness Negative: Neg weakness in arms or legs, no facial droop, no visual field defect, no slurred speech  Physical Exam  BP (!) 136/101   Pulse 91   Resp 18   Ht '5\' 3"'$  (1.6 m)   Wt 89.4 kg   SpO2 100%   BMI 34.90 kg/m  Gen:   Awake, no distress   Resp:  Normal effort  MSK:   Moves extremities without difficulty  Other:    Medical Decision Making  Medically screening exam initiated at 7:19 PM.  Appropriate orders placed.  Eliabeth Shoff was informed that the remainder of the evaluation will be completed by another provider, this initial triage assessment does not replace that evaluation, and the importance of remaining in the ED until their evaluation is complete.  I recommend starting stroke order set, but no code stroke needed now.   Isla Pence, MD 03/08/22 1921

## 2022-03-08 NOTE — ED Provider Notes (Signed)
[facility] EMERGENCY DEPARTMENT Provider Note   CSN: [id number] Arrival date & time: 03/08/22  1807     History {Add pertinent medical, surgical, social history, OB history to HPI:1} Chief Complaint  Patient presents with  . Neurologic Problem    Alexis Craig is a 41 y.o. female.  Patient as above with significant medical history as below, including migraine, anixety, depression, insominia who presents to the ED with complaint of ha, neuro changes. Pt reports around 1745 she began to have a headache while eating dinner, she was having tunnel vision, felt diaphoretic, cold/clammy, tingling to her hands b/l, no numbness, thought she was going to have LOC, had a mild HA and was having some slurred speech during this time. Symptoms resolved entirely after 10-15 minutes. She reports that she feels tired but otherwise is back to her baseline. Had a mild HA all day. No hx of aura w/ prior headaches, takes otc medications for her HA typically.      Past Medical History:  Diagnosis Date  . Anal fissure 04/09/2009  . ANXIETY 03/09/2008  . Depression   . GERD (gastroesophageal reflux disease)   . Gestational diabetes    metformin  . History of alcohol abuse    7 years ago  . Hx of varicella   . INSOMNIA 10/05/2007  . INTENTION TREMOR 06/13/2008  . NEPHROLITHIASIS, HX OF 08/23/2007   passed stone, no surgery  . RECTAL BLEEDING 04/09/2009  . RESTLESS LEG SYNDROME 01/22/2009    Past Surgical History:  Procedure Laterality Date  . DILATION AND EVACUATION N/A 03/31/2019   Procedure: DILATATION AND EVACUATION with genetic studies;  Surgeon: Louretta Shorten, MD;  Location: [facility];  Service: Gynecology;  Laterality: N/A;  with genetic studies  . EXCISIONAL HEMORRHOIDECTOMY    . KIDNEY STONE SURGERY Right 07/2020  . KIDNEY STONE SURGERY Left 08/2020  . WISDOM TOOTH EXTRACTION       The history is provided by the patient and the spouse. No language interpreter was used.   Neurologic Problem Associated symptoms include headaches. Pertinent negatives include no chest pain, no abdominal pain and no shortness of breath.       Home Medications Prior to Admission medications   Medication Sig Start Date End Date Taking? Authorizing Provider  acetaminophen (TYLENOL) 500 MG tablet Take 500 mg by mouth every 6 (six) hours as needed for moderate pain.     [provider]  ALPRAZolam Duanne Moron) 0.5 MG tablet Take 1 tablet (0.5 mg total) by mouth daily as needed for anxiety (for airplane travel). 06/13/21   Isaac Bliss, Rayford Halsted, MD  amLODipine (NORVASC) 5 MG tablet TAKE 1 TABLET(5 MG) BY MOUTH DAILY 02/19/22   Isaac Bliss, Rayford Halsted, MD  atorvastatin (LIPITOR) 40 MG tablet TAKE 1 TABLET(40 MG) BY MOUTH DAILY 02/19/22   Isaac Bliss, Rayford Halsted, MD  cholecalciferol (VITAMIN D3) 25 MCG (1000 UT) tablet Take 1,000 Units by mouth daily.    [provider]  hydrochlorothiazide (HYDRODIURIL) 25 MG tablet TAKE 1 TABLET(25 MG) BY MOUTH DAILY 10/08/21   Isaac Bliss, Rayford Halsted, MD  metFORMIN (GLUCOPHAGE-XR) 750 MG 24 hr tablet Take 750 mg by mouth 2 (two) times daily.    [provider]  omeprazole (PRILOSEC OTC) 20 MG tablet Take 20 mg by mouth daily.    [provider]  potassium chloride SA (KLOR-CON M) 20 MEQ tablet Take 1 tablet (20 mEq total) by mouth 2 (two) times daily for 5 days.  03/05/22 03/10/22  Isaac Bliss, Rayford Halsted, MD  pramipexole (MIRAPEX) 0.5 MG tablet TAKE 1 TABLET(0.5 MG) BY MOUTH AT BEDTIME AS NEEDED 09/23/21   Isaac Bliss, Rayford Halsted, MD  Prenatal Vit-Fe Fumarate-FA (PRENATAL MULTIVITAMIN) TABS tablet Take 1 tablet by mouth daily at 12 noon.    [provider]  sertraline (ZOLOFT) 100 MG tablet TAKE 1 TABLET(100 MG) BY MOUTH DAILY 02/19/22   Isaac Bliss, Rayford Halsted, MD      Allergies    Nsaids and Ibuprofen    Review of Systems   Review of Systems  Constitutional:  Positive for diaphoresis.  Negative for activity change and fever.  HENT:  Negative for facial swelling and trouble swallowing.   Eyes:  Negative for discharge and redness.  Respiratory:  Negative for cough and shortness of breath.   Cardiovascular:  Negative for chest pain and palpitations.  Gastrointestinal:  Negative for abdominal pain and nausea.  Genitourinary:  Negative for dysuria and flank pain.  Musculoskeletal:  Negative for back pain and gait problem.  Skin:  Negative for pallor and rash.  Neurological:  Positive for speech difficulty, light-headedness and headaches. Negative for syncope.    Physical Exam Updated Vital Signs BP 128/76   Pulse 86   Resp 13   Ht '5\' 3"'$  (1.6 m)   Wt 89.4 kg   SpO2 97%   BMI 34.90 kg/m  Physical Exam Vitals and nursing note reviewed.  Constitutional:      General: She is not in acute distress.    Appearance: Normal appearance. She is not ill-appearing, toxic-appearing or diaphoretic.  HENT:     Head: Normocephalic and atraumatic. No raccoon eyes, Battle's sign, right periorbital erythema or left periorbital erythema.     Jaw: There is normal jaw occlusion. No trismus.     Right Ear: External ear normal.     Left Ear: External ear normal.     Nose: Nose normal.     Mouth/Throat:     Mouth: Mucous membranes are moist.  Eyes:     General: No scleral icterus.       Right eye: No discharge.        Left eye: No discharge.     Extraocular Movements: Extraocular movements intact.     Pupils: Pupils are equal, round, and reactive to light.  Neck:     Trachea: Trachea normal. No tracheal deviation.  Cardiovascular:     Rate and Rhythm: Normal rate and regular rhythm.     Pulses: Normal pulses.     Heart sounds: Normal heart sounds.  Pulmonary:     Effort: Pulmonary effort is normal. No respiratory distress.     Breath sounds: Normal breath sounds.  Abdominal:     General: Abdomen is flat.     Tenderness: There is no abdominal tenderness.  Musculoskeletal:         General: Normal range of motion.     Cervical back: Full passive range of motion without pain and normal range of motion.     Right lower leg: No edema.     Left lower leg: No edema.  Skin:    General: Skin is warm and dry.     Capillary Refill: Capillary refill takes less than 2 seconds.  Neurological:     Mental Status: She is alert and oriented to person, place, and time.     GCS: GCS eye subscore is 4. GCS verbal subscore is 5. GCS motor subscore is 6.  Cranial Nerves: Cranial nerves 2-12 are intact. No dysarthria or facial asymmetry.     Sensory: Sensation is intact. No sensory deficit.     Motor: Motor function is intact. No tremor.     Coordination: Coordination is intact. Finger-Nose-Finger Test normal.     Comments: Strength 5/5 BLUE BLLE  Psychiatric:        Mood and Affect: Mood normal.        Behavior: Behavior normal.    ED Results / Procedures / Treatments   Labs (all labs ordered are listed, but only abnormal results are displayed) Labs Reviewed  CBC - Abnormal; Notable for the following components:      Result Value   WBC 11.4 (*)    All other components within normal limits  COMPREHENSIVE METABOLIC PANEL - Abnormal; Notable for the following components:   Potassium 3.3 (*)    Glucose, Bld 108 (*)    All other components within normal limits  I-STAT CHEM 8, ED - Abnormal; Notable for the following components:   Potassium 3.1 (*)    Glucose, Bld 105 (*)    Calcium, Ion 1.01 (*)    All other components within normal limits  CBG MONITORING, ED - Abnormal; Notable for the following components:   Glucose-Capillary 112 (*)    All other components within normal limits  PROTIME-INR  APTT  DIFFERENTIAL  ETHANOL  CBG MONITORING, ED  I-STAT BETA HCG BLOOD, ED ([facility], [facility], [facility] ONLY)    EKG None  Radiology CT HEAD WO CONTRAST  Result Date: 03/08/2022 CLINICAL DATA:  Blurred vision and headache EXAM: CT HEAD WITHOUT CONTRAST TECHNIQUE: Contiguous axial images  were obtained from the base of the skull through the vertex without intravenous contrast. RADIATION DOSE REDUCTION: This exam was performed according to the departmental dose-optimization program which includes automated exposure control, adjustment of the mA and/or kV according to patient size and/or use of iterative reconstruction technique. COMPARISON:  CT 02/15/2008 FINDINGS: Brain: No intracranial hemorrhage, mass effect, or evidence of acute infarct. No hydrocephalus. No extra-axial fluid collection. Vascular: No hyperdense vessel or unexpected calcification. Skull: No fracture or focal lesion. Sinuses/Orbits: No acute finding. Paranasal sinuses and mastoid air cells are well aerated. Other: None. IMPRESSION: Unremarkable noncontrast CT head. Electronically Signed   By: Placido Sou M.D.   On: 03/08/2022 20:39    Procedures Procedures  {Document cardiac monitor, telemetry assessment procedure when appropriate:1}  Medications Ordered in ED Medications  sodium chloride flush (NS) 0.9 % injection 3 mL (has no administration in time range)  sodium chloride 0.9 % bolus 1,000 mL (has no administration in time range)  potassium chloride SA (KLOR-CON M) CR tablet 40 mEq (40 mEq Oral Given 03/08/22 2038)  magnesium oxide (MAG-OX) tablet 400 mg (400 mg Oral Given 03/08/22 2040)    ED Course/ Medical Decision Making/ A&P Clinical Course as of 03/08/22 2159  Sat Mar 08, 2022  2046 ABCD2 score is LOW at 2 points [SG]    Clinical Course User Index [SG] Jeanell Sparrow, DO                           Medical Decision Making Amount and/or Complexity of Data Reviewed Labs: ordered. Radiology: ordered.  Risk OTC drugs. Prescription drug management.   This patient presents to the ED with chief complaint(s) of *** with pertinent past medical history of *** which further complicates the presenting complaint. The complaint involves an extensive differential  diagnosis and also carries with it a high risk  of complications and morbidity.    The differential diagnosis includes but not limited to ***. Serious etiologies were considered.   The initial plan is to ***   Additional history obtained: Additional history obtained from {additional history:26846} Records reviewed {records:26847}  Independent labs interpretation:  The following labs were independently interpreted: ***  Independent visualization of imaging: - I independently visualized the following imaging with scope of interpretation limited to determining acute life threatening conditions related to emergency care: ***, which revealed ***  Cardiac monitoring was reviewed and interpreted by myself which shows ***  Treatment and Reassessment: ***  Consultation: - Consulted or discussed management/test interpretation w/ external professional: ***  Consideration for admission or further workup: Admission was considered ***  Social Determinants of health: Social History   Tobacco Use  . Smoking status: Former    Packs/day: 0.50    Years: 12.00    Total pack years: 6.00    Types: Cigarettes    Quit date: 06/24/2011    Years since quitting: 10.7  . Smokeless tobacco: Never  Vaping Use  . Vaping Use: Never used  Substance Use Topics  . Alcohol use: No    Comment: hx etoh abuse 7 years ago  . Drug use: No   \  {Document critical care time when appropriate:1} {Document review of labs and clinical decision tools ie heart score, Chads2Vasc2 etc:1}  {Document your independent review of radiology images, and any outside records:1} {Document your discussion with family members, caretakers, and with consultants:1} {Document social determinants of health affecting pt's care:1} {Document your decision making why or why not admission, treatments were needed:1} Final Clinical Impression(s) / ED Diagnoses Final diagnoses:  None    Rx / DC Orders ED Discharge Orders     None

## 2022-03-09 ENCOUNTER — Emergency Department (HOSPITAL_COMMUNITY): Payer: BC Managed Care – PPO

## 2022-03-09 DIAGNOSIS — R4781 Slurred speech: Secondary | ICD-10-CM | POA: Diagnosis not present

## 2022-03-09 DIAGNOSIS — R42 Dizziness and giddiness: Secondary | ICD-10-CM | POA: Diagnosis not present

## 2022-03-09 DIAGNOSIS — H538 Other visual disturbances: Secondary | ICD-10-CM | POA: Diagnosis not present

## 2022-03-11 ENCOUNTER — Other Ambulatory Visit (INDEPENDENT_AMBULATORY_CARE_PROVIDER_SITE_OTHER): Payer: BC Managed Care – PPO

## 2022-03-11 DIAGNOSIS — E876 Hypokalemia: Secondary | ICD-10-CM | POA: Diagnosis not present

## 2022-03-11 LAB — BASIC METABOLIC PANEL
BUN: 26 mg/dL — ABNORMAL HIGH (ref 6–23)
CO2: 25 mEq/L (ref 19–32)
Calcium: 9.8 mg/dL (ref 8.4–10.5)
Chloride: 103 mEq/L (ref 96–112)
Creatinine, Ser: 0.79 mg/dL (ref 0.40–1.20)
GFR: 91.81 mL/min (ref >60.00)
Glucose, Bld: 98 mg/dL (ref 70–99)
Potassium: 3.6 mEq/L (ref 3.5–5.1)
Sodium: 138 mEq/L (ref 135–145)

## 2022-03-12 ENCOUNTER — Other Ambulatory Visit: Payer: BC Managed Care – PPO

## 2022-03-18 ENCOUNTER — Other Ambulatory Visit: Payer: Self-pay | Admitting: Internal Medicine

## 2022-03-18 DIAGNOSIS — G2581 Restless legs syndrome: Secondary | ICD-10-CM

## 2022-03-24 ENCOUNTER — Telehealth: Payer: BC Managed Care – PPO | Admitting: Physician Assistant

## 2022-03-24 DIAGNOSIS — U071 COVID-19: Secondary | ICD-10-CM | POA: Diagnosis not present

## 2022-03-24 MED ORDER — BENZONATATE 100 MG PO CAPS: 100.0000 mg | ORAL_CAPSULE | Freq: Three times a day (TID) | ORAL | 0 refills | Status: DC | PRN

## 2022-03-24 MED ORDER — NIRMATRELVIR/RITONAVIR (PAXLOVID)TABLET: 3.0000 | ORAL_TABLET | Freq: Two times a day (BID) | ORAL | 0 refills | Status: AC

## 2022-03-24 MED ORDER — METHOCARBAMOL 500 MG PO TABS: 500.0000 mg | ORAL_TABLET | Freq: Four times a day (QID) | ORAL | 0 refills | Status: DC

## 2022-03-24 MED ORDER — ONDANSETRON 4 MG PO TBDP: 4.0000 mg | ORAL_TABLET | Freq: Three times a day (TID) | ORAL | 0 refills | Status: DC | PRN

## 2022-03-24 MED ORDER — PROMETHAZINE-DM 6.25-15 MG/5ML PO SYRP: 5.0000 mL | ORAL_SOLUTION | Freq: Four times a day (QID) | ORAL | 0 refills | Status: DC | PRN

## 2022-03-24 NOTE — Patient Instructions (Signed)
Alexis Craig, thank you for joining Mar Daring, PA-C for today's virtual visit.  While this provider is not your primary care provider (PCP), if your PCP is located in our provider database this encounter information will be shared with them immediately following your visit.  Consent: (Patient) Alexis Craig provided verbal consent for this virtual visit at the beginning of the encounter.  Current Medications:  Current Outpatient Medications:    benzonatate (TESSALON) 100 MG capsule, Take 1 capsule (100 mg total) by mouth 3 (three) times daily as needed., Disp: 30 capsule, Rfl: 0   methocarbamol (ROBAXIN) 500 MG tablet, Take 1 tablet (500 mg total) by mouth 4 (four) times daily., Disp: 15 tablet, Rfl: 0   nirmatrelvir/ritonavir EUA (PAXLOVID) 20 x 150 MG & 10 x '100MG'$  TABS, Take 3 tablets by mouth 2 (two) times daily for 5 days. (Take nirmatrelvir 150 mg two tablets twice daily for 5 days and ritonavir 100 mg one tablet twice daily for 5 days) Patient GFR is 91, Disp: 30 tablet, Rfl: 0   ondansetron (ZOFRAN-ODT) 4 MG disintegrating tablet, Take 1 tablet (4 mg total) by mouth every 8 (eight) hours as needed for nausea or vomiting., Disp: 20 tablet, Rfl: 0   promethazine-dextromethorphan (PROMETHAZINE-DM) 6.25-15 MG/5ML syrup, Take 5 mLs by mouth 4 (four) times daily as needed., Disp: 118 mL, Rfl: 0   acetaminophen (TYLENOL) 500 MG tablet, Take 500 mg by mouth every 6 (six) hours as needed for moderate pain. , Disp: , Rfl:    ALPRAZolam (XANAX) 0.5 MG tablet, Take 1 tablet (0.5 mg total) by mouth daily as needed for anxiety (for airplane travel)., Disp: 20 tablet, Rfl: 0   amLODipine (NORVASC) 5 MG tablet, TAKE 1 TABLET(5 MG) BY MOUTH DAILY, Disp: 90 tablet, Rfl: 1   atorvastatin (LIPITOR) 40 MG tablet, TAKE 1 TABLET(40 MG) BY MOUTH DAILY, Disp: 90 tablet, Rfl: 1   cholecalciferol (VITAMIN D3) 25 MCG (1000 UT) tablet, Take 1,000 Units by mouth daily., Disp: , Rfl:     hydrochlorothiazide (HYDRODIURIL) 25 MG tablet, TAKE 1 TABLET(25 MG) BY MOUTH DAILY, Disp: 90 tablet, Rfl: 1   metFORMIN (GLUCOPHAGE-XR) 750 MG 24 hr tablet, Take 750 mg by mouth 2 (two) times daily., Disp: , Rfl:    omeprazole (PRILOSEC OTC) 20 MG tablet, Take 20 mg by mouth daily., Disp: , Rfl:    potassium chloride SA (KLOR-CON M) 20 MEQ tablet, Take 1 tablet (20 mEq total) by mouth 2 (two) times daily for 5 days., Disp: 10 tablet, Rfl: 0   pramipexole (MIRAPEX) 0.5 MG tablet, TAKE 1 TABLET(0.5 MG) BY MOUTH AT BEDTIME AS NEEDED, Disp: 90 tablet, Rfl: 1   Prenatal Vit-Fe Fumarate-FA (PRENATAL MULTIVITAMIN) TABS tablet, Take 1 tablet by mouth daily at 12 noon., Disp: , Rfl:    sertraline (ZOLOFT) 100 MG tablet, TAKE 1 TABLET(100 MG) BY MOUTH DAILY, Disp: 90 tablet, Rfl: 1   Medications ordered in this encounter:  Meds ordered this encounter  Medications   nirmatrelvir/ritonavir EUA (PAXLOVID) 20 x 150 MG & 10 x '100MG'$  TABS    Sig: Take 3 tablets by mouth 2 (two) times daily for 5 days. (Take nirmatrelvir 150 mg two tablets twice daily for 5 days and ritonavir 100 mg one tablet twice daily for 5 days) Patient GFR is 91    Dispense:  30 tablet    Refill:  0    Order Specific Question:   Supervising Provider    Answer:   Lanny Cramp,  PHILIP O [[id number]]   ondansetron (ZOFRAN-ODT) 4 MG disintegrating tablet    Sig: Take 1 tablet (4 mg total) by mouth every 8 (eight) hours as needed for nausea or vomiting.    Dispense:  20 tablet    Refill:  0    Order Specific Question:   Supervising Provider    Answer:   Chase Picket [id number]   promethazine-dextromethorphan (PROMETHAZINE-DM) 6.25-15 MG/5ML syrup    Sig: Take 5 mLs by mouth 4 (four) times daily as needed.    Dispense:  118 mL    Refill:  0    Order Specific Question:   Supervising Provider    Answer:   Chase Picket [[id number]]   benzonatate (TESSALON) 100 MG capsule    Sig: Take 1 capsule (100 mg total) by mouth 3 (three) times daily as  needed.    Dispense:  30 capsule    Refill:  0    Order Specific Question:   Supervising Provider    Answer:   Chase Picket [[id number]]   methocarbamol (ROBAXIN) 500 MG tablet    Sig: Take 1 tablet (500 mg total) by mouth 4 (four) times daily.    Dispense:  15 tablet    Refill:  0    Order Specific Question:   Supervising Provider    Answer:   Chase Picket [id number]     *If you need refills on other medications prior to your next appointment, please contact your pharmacy*  Follow-Up: Call back or seek an in-person evaluation if the symptoms worsen or if the condition fails to improve as anticipated.  [place] [phone]  Other Instructions  COVID-19 COVID-19, or coronavirus disease 2019, is an infection that is caused by a new (novel) coronavirus called SARS-CoV-2. COVID-19 can cause many symptoms. In some people, the virus may not cause any symptoms. In others, it may cause mild or severe symptoms. Some people with severe infection develop severe disease. What are the causes? This illness is caused by a virus. The virus may be in the air as tiny specks of fluid (aerosols) or droplets, or it may be on surfaces. You may catch the virus by: Breathing in droplets from an infected person. Droplets can be spread by a person breathing, speaking, singing, coughing, or sneezing. Touching something, like a table or a doorknob, that has virus on it (is contaminated) and then touching your mouth, nose, or eyes. What increases the risk? Risk for infection: You are more likely to get infected with the COVID-19 virus if: You are within 6 ft (1.8 m) of a person with COVID-19 for 15 minutes or longer. You are providing care for a person who is infected with COVID-19. You are in close personal contact with other people. Close personal contact includes hugging, kissing, or sharing eating or drinking utensils. Risk for serious illness caused by COVID-19: You are more  likely to get seriously ill from the COVID-19 virus if: You have cancer. You have a long-term (chronic) disease, such as: Chronic lung disease. This includes pulmonary embolism, chronic obstructive pulmonary disease, and cystic fibrosis. Long-term disease that lowers your body's ability to fight infection (immunocompromise). Serious cardiac conditions, such as heart failure, coronary artery disease, or cardiomyopathy. Diabetes. Chronic kidney disease. Liver diseases. These include cirrhosis, nonalcoholic fatty liver disease, alcoholic liver disease, or autoimmune hepatitis. You have obesity. You are pregnant or were recently pregnant. You have sickle cell disease. What are the signs or symptoms? Symptoms  of this condition can range from mild to severe. Symptoms may appear any time from 2 to 14 days after being exposed to the virus. They include: Fever or chills. Shortness of breath or trouble breathing. Feeling tired or very tired. Headaches, body aches, or muscle aches. Runny or stuffy nose, sneezing, coughing, or sore throat. New loss of taste or smell. This is rare. Some people may also have stomach problems, such as nausea, vomiting, or diarrhea. Other people may not have any symptoms of COVID-19. How is this diagnosed? This condition may be diagnosed by testing samples to check for the COVID-19 virus. The most common tests are the PCR test and the antigen test. Tests may be done in the lab or at home. They include: Using a swab to take a sample of fluid from the back of your nose and throat (nasopharyngeal fluid), from your nose, or from your throat. Testing a sample of saliva from your mouth. Testing a sample of coughed-up mucus from your lungs (sputum). How is this treated? Treatment for COVID-19 infection depends on the severity of the condition. Mild symptoms can be managed at home with rest, fluids, and over-the-counter medicines. Serious symptoms may be treated in a hospital  intensive care unit (ICU). Treatment in the ICU may include: Supplemental oxygen. Extra oxygen is given through a tube in the nose, a face mask, or a hood. Medicines. These may include: Antivirals, such as monoclonal antibodies. These help your body fight off certain viruses that can cause disease. Anti-inflammatories, such as corticosteroids. These reduce inflammation and suppress the immune system. Antithrombotics. These prevent or treat blood clots, if they develop. Convalescent plasma. This helps boost your immune system, if you have an underlying immunosuppressive condition or are getting immunosuppressive treatments. Prone positioning. This means you will lie on your stomach. This helps oxygen to get into your lungs. Infection control measures. If you are at risk for more serious illness caused by COVID-19, your health care provider may prescribe two long-acting monoclonal antibodies, given together every 6 months. How is this prevented? To protect yourself: Use preventive medicine (pre-exposure prophylaxis). You may get pre-exposure prophylaxis if you have moderate or severe immunocompromise. Get vaccinated. Anyone 18 months old or older who meets guidelines can get a COVID-19 vaccine or vaccine series. This includes people who are pregnant or making breast milk (lactating). Get an added dose of COVID-19 vaccine after your first vaccine or vaccine series if you have moderate to severe immunocompromise. This applies if you have had a solid organ transplant or have been diagnosed with an immunocompromising condition. You should get the added dose 4 weeks after you got the first COVID-19 vaccine or vaccine series. If you get an mRNA vaccine, you will need a 3-dose primary series. If you get the J&J/Janssen vaccine, you will need a 2-dose primary series, with the second dose being an mRNA vaccine. Talk to your health care provider about getting experimental monoclonal antibodies. This treatment  is approved under emergency use authorization to prevent severe illness before or after being exposed to the COVID-19 virus. You may be given monoclonal antibodies if: You have moderate or severe immunocompromise. This includes treatments that lower your immune response. People with immunocompromise may not develop protection against COVID-19 when they are vaccinated. You cannot be vaccinated. You may not get a vaccine if you have a severe allergic reaction to the vaccine or its components. You are not fully vaccinated. You are in a facility where COVID-19 is present and: Are  in close contact with a person who is infected with the COVID-19 virus. Are at high risk of being exposed to the COVID-19 virus. You are at risk of illness from new variants of the COVID-19 virus. To protect others: If you have symptoms of COVID-19, take steps to prevent the virus from spreading to others. Stay home. Leave your house only to get medical care. Do not use public transit, if possible. Do not travel while you are sick. Wash your hands often with soap and water for at least 20 seconds. If soap and water are not available, use alcohol-based hand sanitizer. Make sure that all people in your household wash their hands well and often. Cough or sneeze into a tissue or your sleeve or elbow. Do not cough or sneeze into your hand or into the air. Where to find more information Centers for Disease Control and Prevention: CharmCourses.be World Health Organization: https://www.castaneda.info/ Get help right away if: You have trouble breathing. You have pain or pressure in your chest. You are confused. You have bluish lips and fingernails. You have trouble waking from sleep. You have symptoms that get worse. These symptoms may be an emergency. Get help right away. Call 911. Do not wait to see if the symptoms will go away. Do not drive yourself to the hospital. Summary COVID-19 is an infection that  is caused by a new coronavirus. Sometimes, there are no symptoms. Other times, symptoms range from mild to severe. Some people with a severe COVID-19 infection develop severe disease. The virus that causes COVID-19 can spread from person to person through droplets or aerosols from breathing, speaking, singing, coughing, or sneezing. Mild symptoms of COVID-19 can be managed at home with rest, fluids, and over-the-counter medicines. This information is not intended to replace advice given to you by your health care provider. Make sure you discuss any questions you have with your health care provider. Document Revised: 06/06/2021 Document Reviewed: 06/06/2021 Elsevier Patient Education  Page.    If you have been instructed to have an in-person evaluation today at a local Urgent Care facility, please use the link below. It will take you to a list of all of our available [facility], including address, phone number and hours of operation. Please do not delay care.  [facility]  If you or a family member do not have a primary care provider, use the link below to schedule a visit and establish care. When you choose a [facility] primary care physician or advanced practice provider, you gain a long-term partner in health. Find a Primary Care Provider  Learn more about [facility] in-office and virtual care options: [facility]

## 2022-03-24 NOTE — Progress Notes (Signed)
Virtual Visit Consent   Alexis Craig, you are scheduled for a virtual visit with a [facility] provider today. Just as with appointments in the office, your consent must be obtained to participate. Your consent will be active for this visit and any virtual visit you may have with one of our providers in the next 365 days. If you have a MyChart account, a copy of this consent can be sent to you electronically.  As this is a virtual visit, video technology does not allow for your provider to perform a traditional examination. This may limit your provider's ability to fully assess your condition. If your provider identifies any concerns that need to be evaluated in person or the need to arrange testing (such as labs, EKG, etc.), we will make arrangements to do so. Although advances in technology are sophisticated, we cannot ensure that it will always work on either your end or our end. If the connection with a video visit is poor, the visit may have to be switched to a telephone visit. With either a video or telephone visit, we are not always able to ensure that we have a secure connection.  By engaging in this virtual visit, you consent to the provision of healthcare and authorize for your insurance to be billed (if applicable) for the services provided during this visit. Depending on your insurance coverage, you may receive a charge related to this service.  I need to obtain your verbal consent now. Are you willing to proceed with your visit today? Alexis Craig has provided verbal consent on 03/24/2022 for a virtual visit (video or telephone). Mar Daring, PA-C  Date: 03/24/2022 8:55 AM  Virtual Visit via Video Note   I, Mar Daring, connected with  Alexis Craig  ([id number], 1980/11/28) on 03/24/22 at  8:45 AM EDT by a video-enabled telemedicine application and verified that I am speaking with the correct person using two identifiers.  Location: Patient: Virtual  Visit Location Patient: Home Provider: Virtual Visit Location Provider: Home Office   I discussed the limitations of evaluation and management by telemedicine and the availability of in person appointments. The patient expressed understanding and agreed to proceed.    History of Present Illness: Alexis Craig is a 41 y.o. who identifies as a female who was assigned female at birth, and is being seen today for Covid 43.  HPI: URI  This is a new problem. The current episode started in the past 7 days (Tested positive for covid 19 on at home test on Saturday morning; Symptoms started Friday night). The problem has been gradually worsening. The maximum temperature recorded prior to her arrival was 102 - 102.9 F. The fever has been present for 1 to 2 days. Associated symptoms include congestion (mild), coughing, headaches, nausea, neck pain and a sore throat. Pertinent negatives include no diarrhea, ear pain, plugged ear sensation, rhinorrhea, sinus pain or vomiting. Associated symptoms comments: Chills, myalgias, fatigue. Treatments tried: dayquil, nyquil, delsym, tylenol, mucinex. The treatment provided no relief.      Problems:  Patient Active Problem List   Diagnosis Date Noted   Hypertension 02/27/2021   Hyperlipidemia 02/27/2021   Changing skin lesion 01/13/2020   Gestational diabetes mellitus (GDM) affecting pregnancy 03/11/2016   ANAL FISSURE 04/09/2009   RECTAL BLEEDING 04/09/2009   RESTLESS LEG SYNDROME 01/22/2009   INTENTION TREMOR 06/13/2008   Anxiety state 03/09/2008   INSOMNIA 10/05/2007   HEADACHE 08/23/2007   NEPHROLITHIASIS, HX OF 08/23/2007  Allergies:  Allergies  Allergen Reactions   Nsaids Rash    Blisters also   Ibuprofen     Fixed drug reaction with bullous dermatitis   Medications:  Current Outpatient Medications:    benzonatate (TESSALON) 100 MG capsule, Take 1 capsule (100 mg total) by mouth 3 (three) times daily as needed., Disp: 30 capsule, Rfl:  0   methocarbamol (ROBAXIN) 500 MG tablet, Take 1 tablet (500 mg total) by mouth 4 (four) times daily., Disp: 15 tablet, Rfl: 0   nirmatrelvir/ritonavir EUA (PAXLOVID) 20 x 150 MG & 10 x '100MG'$  TABS, Take 3 tablets by mouth 2 (two) times daily for 5 days. (Take nirmatrelvir 150 mg two tablets twice daily for 5 days and ritonavir 100 mg one tablet twice daily for 5 days) Patient GFR is 91, Disp: 30 tablet, Rfl: 0   ondansetron (ZOFRAN-ODT) 4 MG disintegrating tablet, Take 1 tablet (4 mg total) by mouth every 8 (eight) hours as needed for nausea or vomiting., Disp: 20 tablet, Rfl: 0   promethazine-dextromethorphan (PROMETHAZINE-DM) 6.25-15 MG/5ML syrup, Take 5 mLs by mouth 4 (four) times daily as needed., Disp: 118 mL, Rfl: 0   acetaminophen (TYLENOL) 500 MG tablet, Take 500 mg by mouth every 6 (six) hours as needed for moderate pain. , Disp: , Rfl:    ALPRAZolam (XANAX) 0.5 MG tablet, Take 1 tablet (0.5 mg total) by mouth daily as needed for anxiety (for airplane travel)., Disp: 20 tablet, Rfl: 0   amLODipine (NORVASC) 5 MG tablet, TAKE 1 TABLET(5 MG) BY MOUTH DAILY, Disp: 90 tablet, Rfl: 1   atorvastatin (LIPITOR) 40 MG tablet, TAKE 1 TABLET(40 MG) BY MOUTH DAILY, Disp: 90 tablet, Rfl: 1   cholecalciferol (VITAMIN D3) 25 MCG (1000 UT) tablet, Take 1,000 Units by mouth daily., Disp: , Rfl:    hydrochlorothiazide (HYDRODIURIL) 25 MG tablet, TAKE 1 TABLET(25 MG) BY MOUTH DAILY, Disp: 90 tablet, Rfl: 1   metFORMIN (GLUCOPHAGE-XR) 750 MG 24 hr tablet, Take 750 mg by mouth 2 (two) times daily., Disp: , Rfl:    omeprazole (PRILOSEC OTC) 20 MG tablet, Take 20 mg by mouth daily., Disp: , Rfl:    potassium chloride SA (KLOR-CON M) 20 MEQ tablet, Take 1 tablet (20 mEq total) by mouth 2 (two) times daily for 5 days., Disp: 10 tablet, Rfl: 0   pramipexole (MIRAPEX) 0.5 MG tablet, TAKE 1 TABLET(0.5 MG) BY MOUTH AT BEDTIME AS NEEDED, Disp: 90 tablet, Rfl: 1   Prenatal Vit-Fe Fumarate-FA (PRENATAL MULTIVITAMIN) TABS  tablet, Take 1 tablet by mouth daily at 12 noon., Disp: , Rfl:    sertraline (ZOLOFT) 100 MG tablet, TAKE 1 TABLET(100 MG) BY MOUTH DAILY, Disp: 90 tablet, Rfl: 1  Observations/Objective: Patient is well-developed, well-nourished in no acute distress.  Resting comfortably at home.  Head is normocephalic, atraumatic.  No labored breathing.  Speech is clear and coherent with logical content.  Patient is alert and oriented at baseline.    Assessment and Plan: 1. COVID-19 - nirmatrelvir/ritonavir EUA (PAXLOVID) 20 x 150 MG & 10 x '100MG'$  TABS; Take 3 tablets by mouth 2 (two) times daily for 5 days. (Take nirmatrelvir 150 mg two tablets twice daily for 5 days and ritonavir 100 mg one tablet twice daily for 5 days) Patient GFR is 91  Dispense: 30 tablet; Refill: 0 - ondansetron (ZOFRAN-ODT) 4 MG disintegrating tablet; Take 1 tablet (4 mg total) by mouth every 8 (eight) hours as needed for nausea or vomiting.  Dispense: 20 tablet; Refill: 0 -  promethazine-dextromethorphan (PROMETHAZINE-DM) 6.25-15 MG/5ML syrup; Take 5 mLs by mouth 4 (four) times daily as needed.  Dispense: 118 mL; Refill: 0 - benzonatate (TESSALON) 100 MG capsule; Take 1 capsule (100 mg total) by mouth 3 (three) times daily as needed.  Dispense: 30 capsule; Refill: 0 - methocarbamol (ROBAXIN) 500 MG tablet; Take 1 tablet (500 mg total) by mouth 4 (four) times daily.  Dispense: 15 tablet; Refill: 0  - Continue OTC symptomatic management of choice - Will send OTC vitamins and supplement information through AVS - Paxlovid, Promethazine DM, Tessalon, Zofran, and Robaxin prescribed (Strict drowsiness and medication interaction precautions discussed for Promethazine DM and Robaxin; Do NOT take together) - Patient enrolled in MyChart symptom monitoring - Push fluids - Rest as needed - Discussed return precautions and when to seek in-person evaluation, sent via AVS as well   Follow Up Instructions: I discussed the assessment and  treatment plan with the patient. The patient was provided an opportunity to ask questions and all were answered. The patient agreed with the plan and demonstrated an understanding of the instructions.  A copy of instructions were sent to the patient via MyChart unless otherwise noted below.    The patient was advised to call back or seek an in-person evaluation if the symptoms worsen or if the condition fails to improve as anticipated.  Time:  I spent 13 minutes with the patient via telehealth technology discussing the above problems/concerns.    Mar Daring, PA-C

## 2022-04-02 ENCOUNTER — Other Ambulatory Visit: Payer: Self-pay | Admitting: Internal Medicine

## 2022-04-02 DIAGNOSIS — I10 Essential (primary) hypertension: Secondary | ICD-10-CM

## 2022-04-18 ENCOUNTER — Other Ambulatory Visit (HOSPITAL_COMMUNITY)
Admission: RE | Admit: 2022-04-18 | Discharge: 2022-04-18 | Disposition: A | Discharge status: Home / Self Care | Payer: BC Managed Care – PPO | Source: Ambulatory Visit | Attending: Plastic Surgery | Admitting: Plastic Surgery

## 2022-04-18 ENCOUNTER — Ambulatory Visit: Payer: BC Managed Care – PPO | Admitting: Plastic Surgery

## 2022-04-18 VITALS — BP 126/85 | HR 89 | Ht 63.0 in | Wt 202.4 lb

## 2022-04-18 DIAGNOSIS — D224 Melanocytic nevi of scalp and neck: Secondary | ICD-10-CM

## 2022-04-18 DIAGNOSIS — L738 Other specified follicular disorders: Secondary | ICD-10-CM

## 2022-04-18 DIAGNOSIS — L989 Disorder of the skin and subcutaneous tissue, unspecified: Secondary | ICD-10-CM | POA: Diagnosis not present

## 2022-04-18 DIAGNOSIS — L859 Epidermal thickening, unspecified: Secondary | ICD-10-CM | POA: Diagnosis not present

## 2022-04-18 NOTE — Addendum Note (Signed)
Addended by: Wallace Going on: 04/18/2022 04:14 PM   Modules accepted: Orders

## 2022-04-18 NOTE — Progress Notes (Signed)
Procedure Note  Preoperative Dx:  Nose changing skin lesion Left neck changing skin lesion pigmented  Postoperative Dx: Same  Procedure:  Excision of nose changing skin lesion 3 mm Excision of left neck changing skin lesion pigmented 1 cm  Anesthesia: Lidocaine 1% with 1:100,000 epinephrine  Description of Procedure: Risks and complications were explained to the patient.  Consent was confirmed and the patient understands the risks and benefits.  The potential complications and alternatives were explained and the patient consents.  The patient expressed understanding the option of not having the procedure and the risks of a scar.  Time out was called and all information was confirmed to be correct.    Nose:  The area was prepped and drapped.  Lidocaine 1% with epinepherine was injected in the subcutaneous area.  After waiting several minutes for the local to take affect a #15 blade was used to excise the area in an eliptical pattern. The skin edges were reapproximated with 6-0 Monocryl.  A dressing was applied.  The area was prepped and drapped.  Lidocaine 1% with epinepherine was injected in the subcutaneous area.  After waiting several minutes for the local to take affect a #15 blade was used to excise the area in an eliptical pattern.  The skin edges were reapproximated with 6-0 Monocryl subcuticular running closure.  A dressing was applied.  The patient was given instructions on how to care for the area and a follow up appointment.  Rogena tolerated the procedure well and there were no complications. The specimen was sent to pathology.

## 2022-04-22 LAB — SURGICAL PATHOLOGY

## 2022-04-24 ENCOUNTER — Telehealth: Payer: Self-pay | Admitting: *Deleted

## 2022-04-24 NOTE — Telephone Encounter (Signed)
Pt notified surgical pathology results were benign per Dr. Marla Roe

## 2022-04-29 ENCOUNTER — Institutional Professional Consult (permissible substitution): Payer: BC Managed Care – PPO | Admitting: Plastic Surgery

## 2022-04-29 ENCOUNTER — Encounter: Payer: Self-pay | Admitting: Internal Medicine

## 2022-04-29 DIAGNOSIS — E876 Hypokalemia: Secondary | ICD-10-CM

## 2022-04-29 NOTE — Progress Notes (Unsigned)
Patient is a very pleasant 41 year old female with PMH of changing skin lesions on nose and left side of neck s/p excision performed 04/18/2022 by Dr. Marla Roe who presents to clinic for postprocedural follow-up.  Reviewed procedural note and the skin edges were approximated with 6-0 Monocryl.  Pathology was reviewed.  The nose biopsy was consistent with benign sebaceous gland hyperplasia, negative for malignancy or atypia.  The left neck skin excision was consistent with a benign intradermal nevus.  Today, patient is doing well.  She denies any complaints.  She is already familiar with the pathology and understands that it is negative for malignancy or atypia.  She feels as though she has a protruding stitch on her neck, but feels as though the nose is without any protruding sutures.  Physical exam is entirely reassuring.  Skin edges are well approximated, healing well.  There are 2 sutures removed from the neck excision site without complication or difficulty.  No residual sutures on the nose.  No surrounding erythema or cellulitic changes.  Mildly dry appearing.  Recommending Vaseline x5 days and then she can transition to a silicone scar treatment.  Discussed Silagen.  All questions answered.  No specific follow-up needed.  She will call clinic should she have any additional questions or concerns.

## 2022-04-30 ENCOUNTER — Ambulatory Visit: Payer: BC Managed Care – PPO | Admitting: Physician Assistant

## 2022-04-30 ENCOUNTER — Other Ambulatory Visit (INDEPENDENT_AMBULATORY_CARE_PROVIDER_SITE_OTHER): Payer: BC Managed Care – PPO

## 2022-04-30 DIAGNOSIS — L738 Other specified follicular disorders: Secondary | ICD-10-CM

## 2022-04-30 DIAGNOSIS — E876 Hypokalemia: Secondary | ICD-10-CM

## 2022-04-30 DIAGNOSIS — D239 Other benign neoplasm of skin, unspecified: Secondary | ICD-10-CM

## 2022-04-30 LAB — BASIC METABOLIC PANEL
BUN: 23 mg/dL (ref 6–23)
CO2: 28 mEq/L (ref 19–32)
Calcium: 9.6 mg/dL (ref 8.4–10.5)
Chloride: 101 mEq/L (ref 96–112)
Creatinine, Ser: 0.7 mg/dL (ref 0.40–1.20)
GFR: 106.05 mL/min (ref >60.00)
Glucose, Bld: 101 mg/dL — ABNORMAL HIGH (ref 70–99)
Potassium: 3.5 mEq/L (ref 3.5–5.1)
Sodium: 137 mEq/L (ref 135–145)

## 2022-05-12 DIAGNOSIS — E7800 Pure hypercholesterolemia, unspecified: Secondary | ICD-10-CM | POA: Diagnosis not present

## 2022-05-12 DIAGNOSIS — Z1329 Encounter for screening for other suspected endocrine disorder: Secondary | ICD-10-CM | POA: Diagnosis not present

## 2022-05-12 DIAGNOSIS — D649 Anemia, unspecified: Secondary | ICD-10-CM | POA: Diagnosis not present

## 2022-05-12 DIAGNOSIS — N951 Menopausal and female climacteric states: Secondary | ICD-10-CM | POA: Diagnosis not present

## 2022-05-12 DIAGNOSIS — R7303 Prediabetes: Secondary | ICD-10-CM | POA: Diagnosis not present

## 2022-05-12 DIAGNOSIS — R635 Abnormal weight gain: Secondary | ICD-10-CM | POA: Diagnosis not present

## 2022-05-16 DIAGNOSIS — E611 Iron deficiency: Secondary | ICD-10-CM | POA: Diagnosis not present

## 2022-05-16 DIAGNOSIS — Z1339 Encounter for screening examination for other mental health and behavioral disorders: Secondary | ICD-10-CM | POA: Diagnosis not present

## 2022-05-16 DIAGNOSIS — E7800 Pure hypercholesterolemia, unspecified: Secondary | ICD-10-CM | POA: Diagnosis not present

## 2022-05-16 DIAGNOSIS — R7303 Prediabetes: Secondary | ICD-10-CM | POA: Diagnosis not present

## 2022-05-16 DIAGNOSIS — Z1331 Encounter for screening for depression: Secondary | ICD-10-CM | POA: Diagnosis not present

## 2022-05-16 DIAGNOSIS — Z6834 Body mass index (BMI) 34.0-34.9, adult: Secondary | ICD-10-CM | POA: Diagnosis not present

## 2022-05-20 DIAGNOSIS — I83893 Varicose veins of bilateral lower extremities with other complications: Secondary | ICD-10-CM | POA: Diagnosis not present

## 2022-05-26 DIAGNOSIS — Z6834 Body mass index (BMI) 34.0-34.9, adult: Secondary | ICD-10-CM | POA: Diagnosis not present

## 2022-05-26 DIAGNOSIS — D649 Anemia, unspecified: Secondary | ICD-10-CM | POA: Diagnosis not present

## 2022-06-02 DIAGNOSIS — Z6833 Body mass index (BMI) 33.0-33.9, adult: Secondary | ICD-10-CM | POA: Diagnosis not present

## 2022-06-02 DIAGNOSIS — E611 Iron deficiency: Secondary | ICD-10-CM | POA: Diagnosis not present

## 2022-06-09 DIAGNOSIS — E7800 Pure hypercholesterolemia, unspecified: Secondary | ICD-10-CM | POA: Diagnosis not present

## 2022-06-16 ENCOUNTER — Ambulatory Visit: Payer: BC Managed Care – PPO | Admitting: Internal Medicine

## 2022-06-16 VITALS — BP 118/78 | HR 88 | Temp 98.1°F | Resp 100 | Wt 192.0 lb

## 2022-06-16 DIAGNOSIS — Z6832 Body mass index (BMI) 32.0-32.9, adult: Secondary | ICD-10-CM | POA: Diagnosis not present

## 2022-06-16 DIAGNOSIS — G2581 Restless legs syndrome: Secondary | ICD-10-CM

## 2022-06-16 DIAGNOSIS — E782 Mixed hyperlipidemia: Secondary | ICD-10-CM

## 2022-06-16 DIAGNOSIS — I10 Essential (primary) hypertension: Secondary | ICD-10-CM | POA: Diagnosis not present

## 2022-06-16 DIAGNOSIS — E7800 Pure hypercholesterolemia, unspecified: Secondary | ICD-10-CM | POA: Diagnosis not present

## 2022-06-16 NOTE — Progress Notes (Signed)
Established Patient Office Visit     CC/Reason for Visit: Follow-up chronic conditions  HPI: Alexis Craig is a 41 y.o. female who is coming in today for the above mentioned reasons. Past Medical History is significant for: Hypertension, hyperlipidemia, generalized anxiety disorder, restless leg syndrome and vitamin D deficiency.  She has been feeling well other than continued leg cramps that she feels have gotten worse.  At last visit we had checked electrolytes including potassium and they were normal.   Past Medical/Surgical History: Past Medical History:  Diagnosis Date   Anal fissure 04/09/2009   ANXIETY 03/09/2008   Depression    GERD (gastroesophageal reflux disease)    Gestational diabetes    metformin   History of alcohol abuse    7 years ago   Hx of varicella    INSOMNIA 10/05/2007   INTENTION TREMOR 06/13/2008   NEPHROLITHIASIS, HX OF 08/23/2007   passed stone, no surgery   RECTAL BLEEDING 04/09/2009   RESTLESS LEG SYNDROME 01/22/2009    Past Surgical History:  Procedure Laterality Date   DILATION AND EVACUATION N/A 03/31/2019   Procedure: DILATATION AND EVACUATION with genetic studies;  Surgeon: Louretta Shorten, MD;  Location: [facility];  Service: Gynecology;  Laterality: N/A;  with genetic studies   EXCISIONAL HEMORRHOIDECTOMY     KIDNEY STONE SURGERY Right 07/2020   KIDNEY STONE SURGERY Left 08/2020   WISDOM TOOTH EXTRACTION      Social History:  reports that she quit smoking about 10 years ago. Her smoking use included cigarettes. She has a 6.00 pack-year smoking history. She has never used smokeless tobacco. She reports that she does not drink alcohol and does not use drugs.  Allergies: Allergies  Allergen Reactions   Nsaids Rash    Blisters also   Ibuprofen     Fixed drug reaction with bullous dermatitis    Family History:  Family History  Problem Relation Age of Onset   Diabetes Mother    Thyroid disease Mother    Cancer Mother    Heart  disease Mother    Hyperlipidemia Father    Diabetes Father    Heart disease Maternal Uncle    Cancer Maternal Grandmother    Heart disease Paternal Grandfather      Current Outpatient Medications:    acetaminophen (TYLENOL) 500 MG tablet, Take 500 mg by mouth every 6 (six) hours as needed for moderate pain. , Disp: , Rfl:    ALPRAZolam (XANAX) 0.5 MG tablet, Take 1 tablet (0.5 mg total) by mouth daily as needed for anxiety (for airplane travel)., Disp: 20 tablet, Rfl: 0   amLODipine (NORVASC) 5 MG tablet, TAKE 1 TABLET(5 MG) BY MOUTH DAILY, Disp: 90 tablet, Rfl: 1   atorvastatin (LIPITOR) 40 MG tablet, TAKE 1 TABLET(40 MG) BY MOUTH DAILY, Disp: 90 tablet, Rfl: 1   cholecalciferol (VITAMIN D3) 25 MCG (1000 UT) tablet, Take 1,000 Units by mouth daily., Disp: , Rfl:    hydrochlorothiazide (HYDRODIURIL) 25 MG tablet, TAKE 1 TABLET(25 MG) BY MOUTH DAILY, Disp: 90 tablet, Rfl: 1   metFORMIN (GLUCOPHAGE-XR) 750 MG 24 hr tablet, Take 750 mg by mouth 2 (two) times daily., Disp: , Rfl:    omeprazole (PRILOSEC OTC) 20 MG tablet, Take 20 mg by mouth daily., Disp: , Rfl:    pramipexole (MIRAPEX) 0.5 MG tablet, TAKE 1 TABLET(0.5 MG) BY MOUTH AT BEDTIME AS NEEDED, Disp: 90 tablet, Rfl: 1   Semaglutide, 1 MG/DOSE, 4 MG/3ML SOPN, Inject 1 mg  into the skin once a week., Disp: , Rfl:    sertraline (ZOLOFT) 100 MG tablet, TAKE 1 TABLET(100 MG) BY MOUTH DAILY, Disp: 90 tablet, Rfl: 1   Prenatal Vit-Fe Fumarate-FA (PRENATAL MULTIVITAMIN) TABS tablet, Take 1 tablet by mouth daily at 12 noon. (Patient not taking: Reported on 06/16/2022), Disp: , Rfl:   Review of Systems:  Constitutional: Denies fever, chills, diaphoresis, appetite change and fatigue.  HEENT: Denies photophobia, eye pain, redness, hearing loss, ear pain, congestion, sore throat, rhinorrhea, sneezing, mouth sores, trouble swallowing, neck pain, neck stiffness and tinnitus.   Respiratory: Denies SOB, DOE, cough, chest tightness,  and wheezing.    Cardiovascular: Denies chest pain, palpitations and leg swelling.  Gastrointestinal: Denies nausea, vomiting, abdominal pain, diarrhea, constipation, blood in stool and abdominal distention.  Genitourinary: Denies dysuria, urgency, frequency, hematuria, flank pain and difficulty urinating.  Endocrine: Denies: hot or cold intolerance, sweats, changes in hair or nails, polyuria, polydipsia. Musculoskeletal: Denies  back pain, joint swelling, arthralgias and gait problem.  Skin: Denies pallor, rash and wound.  Neurological: Denies dizziness, seizures, syncope, weakness, light-headedness, numbness and headaches.  Hematological: Denies adenopathy. Easy bruising, personal or family bleeding history  Psychiatric/Behavioral: Denies suicidal ideation, mood changes, confusion, nervousness, sleep disturbance and agitation    Physical Exam: Vitals:   06/16/22 0819  BP: 118/78  Pulse: 88  Resp: (!) 100  Temp: 98.1 F (36.7 C)  TempSrc: Oral  Weight: 192 lb (87.1 kg)    Body mass index is 34.01 kg/m.   Constitutional: NAD, calm, comfortable Eyes: PERRL, lids and conjunctivae normal ENMT: Mucous membranes are moist.  Respiratory: clear to auscultation bilaterally, no wheezing, no crackles. Normal respiratory effort. No accessory muscle use.  Cardiovascular: Regular rate and rhythm, no murmurs / rubs / gallops. No extremity edema.  Psychiatric: Normal judgment and insight. Alert and oriented x 3. Normal mood.    Impression and Plan:  Primary hypertension  Mixed hyperlipidemia  RESTLESS LEG SYNDROME  -Blood pressure is well-controlled. -Leg cramps continue to be an issue.  We have decided to cut down her statin to 3 days a week to see if that has any impact. -She will return in 6 months for follow-up.  Time spent:31 minutes reviewing chart, interviewing and examining patient and formulating plan of care.      Lelon Frohlich, MD [facility]

## 2022-06-24 DIAGNOSIS — R7303 Prediabetes: Secondary | ICD-10-CM | POA: Diagnosis not present

## 2022-06-24 DIAGNOSIS — Z6832 Body mass index (BMI) 32.0-32.9, adult: Secondary | ICD-10-CM | POA: Diagnosis not present

## 2022-07-01 DIAGNOSIS — D649 Anemia, unspecified: Secondary | ICD-10-CM | POA: Diagnosis not present

## 2022-07-01 DIAGNOSIS — Z6831 Body mass index (BMI) 31.0-31.9, adult: Secondary | ICD-10-CM | POA: Diagnosis not present

## 2022-07-03 ENCOUNTER — Telehealth: Payer: BC Managed Care – PPO | Admitting: Physician Assistant

## 2022-07-03 DIAGNOSIS — J208 Acute bronchitis due to other specified organisms: Secondary | ICD-10-CM | POA: Diagnosis not present

## 2022-07-03 DIAGNOSIS — B9689 Other specified bacterial agents as the cause of diseases classified elsewhere: Secondary | ICD-10-CM | POA: Diagnosis not present

## 2022-07-03 MED ORDER — BENZONATATE 100 MG PO CAPS: 100.0000 mg | ORAL_CAPSULE | Freq: Three times a day (TID) | ORAL | 0 refills | Status: DC | PRN

## 2022-07-03 MED ORDER — AZITHROMYCIN 250 MG PO TABS: ORAL_TABLET | ORAL | 0 refills | Status: AC

## 2022-07-03 MED ORDER — PROMETHAZINE-DM 6.25-15 MG/5ML PO SYRP: 5.0000 mL | ORAL_SOLUTION | Freq: Four times a day (QID) | ORAL | 0 refills | Status: DC | PRN

## 2022-07-03 MED ORDER — PREDNISONE 10 MG (21) PO TBPK: ORAL_TABLET | ORAL | 0 refills | Status: DC

## 2022-07-03 NOTE — Patient Instructions (Signed)
Alexis Craig, thank you for joining Alexis Daring, PA-C for today's virtual visit.  While this provider is not your primary care provider (PCP), if your PCP is located in our provider database this encounter information will be shared with them immediately following your visit.   [facility] account gives you access to today's visit and all your visits, tests, and labs performed at [facility] " click here if you don't have a [facility] account or go to [email or web address]  Consent: (Patient) Alexis Craig provided verbal consent for this virtual visit at the beginning of the encounter.  Current Medications:  Current Outpatient Medications:    azithromycin (ZITHROMAX) 250 MG tablet, Take 2 tablets on day 1, then 1 tablet daily on days 2 through 5, Disp: 6 tablet, Rfl: 0   benzonatate (TESSALON) 100 MG capsule, Take 1 capsule (100 mg total) by mouth 3 (three) times daily as needed., Disp: 30 capsule, Rfl: 0   predniSONE (STERAPRED UNI-PAK 21 TAB) 10 MG (21) TBPK tablet, 6 day taper; take as directed on package instructions, Disp: 21 tablet, Rfl: 0   promethazine-dextromethorphan (PROMETHAZINE-DM) 6.25-15 MG/5ML syrup, Take 5 mLs by mouth 4 (four) times daily as needed., Disp: 118 mL, Rfl: 0   acetaminophen (TYLENOL) 500 MG tablet, Take 500 mg by mouth every 6 (six) hours as needed for moderate pain. , Disp: , Rfl:    ALPRAZolam (XANAX) 0.5 MG tablet, Take 1 tablet (0.5 mg total) by mouth daily as needed for anxiety (for airplane travel)., Disp: 20 tablet, Rfl: 0   amLODipine (NORVASC) 5 MG tablet, TAKE 1 TABLET(5 MG) BY MOUTH DAILY, Disp: 90 tablet, Rfl: 1   atorvastatin (LIPITOR) 40 MG tablet, TAKE 1 TABLET(40 MG) BY MOUTH DAILY, Disp: 90 tablet, Rfl: 1   cholecalciferol (VITAMIN D3) 25 MCG (1000 UT) tablet, Take 1,000 Units by mouth daily., Disp: , Rfl:    hydrochlorothiazide (HYDRODIURIL) 25 MG tablet, TAKE 1 TABLET(25 MG) BY MOUTH DAILY,  Disp: 90 tablet, Rfl: 1   metFORMIN (GLUCOPHAGE-XR) 750 MG 24 hr tablet, Take 750 mg by mouth 2 (two) times daily., Disp: , Rfl:    omeprazole (PRILOSEC OTC) 20 MG tablet, Take 20 mg by mouth daily., Disp: , Rfl:    pramipexole (MIRAPEX) 0.5 MG tablet, TAKE 1 TABLET(0.5 MG) BY MOUTH AT BEDTIME AS NEEDED, Disp: 90 tablet, Rfl: 1   Prenatal Vit-Fe Fumarate-FA (PRENATAL MULTIVITAMIN) TABS tablet, Take 1 tablet by mouth daily at 12 noon. (Patient not taking: Reported on 06/16/2022), Disp: , Rfl:    Semaglutide, 1 MG/DOSE, 4 MG/3ML SOPN, Inject 1 mg into the skin once a week., Disp: , Rfl:    sertraline (ZOLOFT) 100 MG tablet, TAKE 1 TABLET(100 MG) BY MOUTH DAILY, Disp: 90 tablet, Rfl: 1   Medications ordered in this encounter:  Meds ordered this encounter  Medications   azithromycin (ZITHROMAX) 250 MG tablet    Sig: Take 2 tablets on day 1, then 1 tablet daily on days 2 through 5    Dispense:  6 tablet    Refill:  0    Order Specific Question:   Supervising Provider    Answer:   Chase Picket [[id number]]   predniSONE (STERAPRED UNI-PAK 21 TAB) 10 MG (21) TBPK tablet    Sig: 6 day taper; take as directed on package instructions    Dispense:  21 tablet    Refill:  0    Order Specific Question:   Supervising Provider  Answer:   Chase Picket [[id number]]   benzonatate (TESSALON) 100 MG capsule    Sig: Take 1 capsule (100 mg total) by mouth 3 (three) times daily as needed.    Dispense:  30 capsule    Refill:  0    Order Specific Question:   Supervising Provider    Answer:   Chase Picket [id number]   promethazine-dextromethorphan (PROMETHAZINE-DM) 6.25-15 MG/5ML syrup    Sig: Take 5 mLs by mouth 4 (four) times daily as needed.    Dispense:  118 mL    Refill:  0    Order Specific Question:   Supervising Provider    Answer:   Chase Picket [[id number]]     *If you need refills on other medications prior to your next appointment, please contact your pharmacy*  Follow-Up: Call  back or seek an in-person evaluation if the symptoms worsen or if the condition fails to improve as anticipated.  [place] [phone]  Other Instructions  Acute Bronchitis, Adult  Acute bronchitis is sudden inflammation of the main airways (bronchi) that come off the windpipe (trachea) in the lungs. The swelling causes the airways to get smaller and make more mucus than normal. This can make it hard to breathe and can cause coughing or noisy breathing (wheezing). Acute bronchitis may last several weeks. The cough may last longer. Allergies, asthma, and exposure to smoke may make the condition worse. What are the causes? This condition can be caused by germs and by substances that irritate the lungs, including: Cold and flu viruses. The most common cause of this condition is the virus that causes the common cold. Bacteria. This is less common. Breathing in substances that irritate the lungs, including: Smoke from cigarettes and other forms of tobacco. Dust and pollen. Fumes from household cleaning products, gases, or burned fuel. Indoor or outdoor air pollution. What increases the risk? The following factors may make you more likely to develop this condition: A weak body's defense system, also called the immune system. A condition that affects your lungs and breathing, such as asthma. What are the signs or symptoms? Common symptoms of this condition include: Coughing. This may bring up clear, yellow, or green mucus from your lungs (sputum). Wheezing. Runny or stuffy nose. Having too much mucus in your lungs (chest congestion). Shortness of breath. Aches and pains, including sore throat or chest. How is this diagnosed? This condition is usually diagnosed based on: Your symptoms and medical history. A physical exam. You may also have other tests, including tests to rule out other conditions, such as pneumonia. These tests include: A test of lung function. Test of  a mucus sample to look for the presence of bacteria. Tests to check the oxygen level in your blood. Blood tests. Chest X-ray. How is this treated? Most cases of acute bronchitis clear up over time without treatment. Your health care provider may recommend: Drinking more fluids to help thin your mucus so it is easier to cough up. Taking inhaled medicine (inhaler) to improve air flow in and out of your lungs. Using a vaporizer or a humidifier. These are machines that add water to the air to help you breathe better. Taking a medicine that thins mucus and clears congestion (expectorant). Taking a medicine that prevents or stops coughing (cough suppressant). It is not common to take an antibiotic medicine for this condition. Follow these instructions at home:  Take over-the-counter and prescription medicines only as told by your health  care provider. Use an inhaler, vaporizer, or humidifier as told by your health care provider. Take two teaspoons (10 mL) of honey at bedtime to lessen coughing at night. Drink enough fluid to keep your urine pale yellow. Do not use any products that contain nicotine or tobacco. These products include cigarettes, chewing tobacco, and vaping devices, such as e-cigarettes. If you need help quitting, ask your health care provider. Get plenty of rest. Return to your normal activities as told by your health care provider. Ask your health care provider what activities are safe for you. Keep all follow-up visits. This is important. How is this prevented? To lower your risk of getting this condition again: Wash your hands often with soap and water for at least 20 seconds. If soap and water are not available, use hand sanitizer. Avoid contact with people who have cold symptoms. Try not to touch your mouth, nose, or eyes with your hands. Avoid breathing in smoke or chemical fumes. Breathing smoke or chemical fumes will make your condition worse. Get the flu shot every  year. Contact a health care provider if: Your symptoms do not improve after 2 weeks. You have trouble coughing up the mucus. Your cough keeps you awake at night. You have a fever. Get help right away if you: Cough up blood. Feel pain in your chest. Have severe shortness of breath. Faint or keep feeling like you are going to faint. Have a severe headache. Have a fever or chills that get worse. These symptoms may represent a serious problem that is an emergency. Do not wait to see if the symptoms will go away. Get medical help right away. Call your local emergency services (911 in the [place] Do not drive yourself to the hospital. Summary Acute bronchitis is inflammation of the main airways (bronchi) that come off the windpipe (trachea) in the lungs. The swelling causes the airways to get smaller and make more mucus than normal. Drinking more fluids can help thin your mucus so it is easier to cough up. Take over-the-counter and prescription medicines only as told by your health care provider. Do not use any products that contain nicotine or tobacco. These products include cigarettes, chewing tobacco, and vaping devices, such as e-cigarettes. If you need help quitting, ask your health care provider. Contact a health care provider if your symptoms do not improve after 2 weeks. This information is not intended to replace advice given to you by your health care provider. Make sure you discuss any questions you have with your health care provider. Document Revised: 09/26/2021 Document Reviewed: 10/17/2020 Elsevier Patient Education  [place].    If you have been instructed to have an in-person evaluation today at a local Urgent Care facility, please use the link below. It will take you to a list of all of our available [facility], including address, phone number and hours of operation. Please do not delay care.  [facility]  If you or a family member do not  have a primary care provider, use the link below to schedule a visit and establish care. When you choose a [facility] primary care physician or advanced practice provider, you gain a long-term partner in health. Find a Primary Care Provider  Learn more about [facility]'s in-office and virtual care options: [facility]

## 2022-07-03 NOTE — Progress Notes (Signed)
Virtual Visit Consent   Alexis Craig, you are scheduled for a virtual visit with a [facility] provider today. Just as with appointments in the office, your consent must be obtained to participate. Your consent will be active for this visit and any virtual visit you may have with one of our providers in the next 365 days. If you have a MyChart account, a copy of this consent can be sent to you electronically.  As this is a virtual visit, video technology does not allow for your provider to perform a traditional examination. This may limit your provider's ability to fully assess your condition. If your provider identifies any concerns that need to be evaluated in person or the need to arrange testing (such as labs, EKG, etc.), we will make arrangements to do so. Although advances in technology are sophisticated, we cannot ensure that it will always work on either your end or our end. If the connection with a video visit is poor, the visit may have to be switched to a telephone visit. With either a video or telephone visit, we are not always able to ensure that we have a secure connection.  By engaging in this virtual visit, you consent to the provision of healthcare and authorize for your insurance to be billed (if applicable) for the services provided during this visit. Depending on your insurance coverage, you may receive a charge related to this service.  I need to obtain your verbal consent now. Are you willing to proceed with your visit today? Iliana Hutt has provided verbal consent on 07/03/2022 for a virtual visit (video or telephone). Mar Daring, PA-C  Date: 07/03/2022 6:46 PM  Virtual Visit via Video Note   I, Mar Daring, connected with  Alexis Craig  ([id number], [birth year]) on 07/03/22 at  6:45 PM EST by a video-enabled telemedicine application and verified that I am speaking with the correct person using two identifiers.  Location: Patient: Virtual  Visit Location Patient: Home Provider: Virtual Visit Location Provider: Home Office   I discussed the limitations of evaluation and management by telemedicine and the availability of in person appointments. The patient expressed understanding and agreed to proceed.    History of Present Illness: Alexis Craig is a 42 y.o. who identifies as a female who was assigned female at birth, and is being seen today for URI symptoms.  HPI: URI  This is a new problem. The current episode started 1 to 4 weeks ago. The problem has been gradually worsening. There has been no fever. Associated symptoms include congestion, coughing and nausea. Pertinent negatives include no diarrhea, ear pain, headaches, plugged ear sensation, rhinorrhea, sinus pain, sore throat, vomiting or wheezing. Associated symptoms comments: Decreased appetite.     Problems:  Patient Active Problem List   Diagnosis Date Noted   Hypertension 02/27/2021   Hyperlipidemia 02/27/2021   Changing skin lesion 01/13/2020   Gestational diabetes mellitus (GDM) affecting pregnancy 03/11/2016   ANAL FISSURE 04/09/2009   RECTAL BLEEDING 04/09/2009   RESTLESS LEG SYNDROME 01/22/2009   INTENTION TREMOR 06/13/2008   Anxiety state 03/09/2008   INSOMNIA 10/05/2007   HEADACHE 08/23/2007   NEPHROLITHIASIS, HX OF 08/23/2007    Allergies:  Allergies  Allergen Reactions   Nsaids Rash    Blisters also   Ibuprofen     Fixed drug reaction with bullous dermatitis   Medications:  Current Outpatient Medications:    azithromycin (ZITHROMAX) 250 MG tablet, Take 2 tablets on day 1,  then 1 tablet daily on days 2 through 5, Disp: 6 tablet, Rfl: 0   benzonatate (TESSALON) 100 MG capsule, Take 1 capsule (100 mg total) by mouth 3 (three) times daily as needed., Disp: 30 capsule, Rfl: 0   predniSONE (STERAPRED UNI-PAK 21 TAB) 10 MG (21) TBPK tablet, 6 day taper; take as directed on package instructions, Disp: 21 tablet, Rfl: 0    promethazine-dextromethorphan (PROMETHAZINE-DM) 6.25-15 MG/5ML syrup, Take 5 mLs by mouth 4 (four) times daily as needed., Disp: 118 mL, Rfl: 0   acetaminophen (TYLENOL) 500 MG tablet, Take 500 mg by mouth every 6 (six) hours as needed for moderate pain. , Disp: , Rfl:    ALPRAZolam (XANAX) 0.5 MG tablet, Take 1 tablet (0.5 mg total) by mouth daily as needed for anxiety (for airplane travel)., Disp: 20 tablet, Rfl: 0   amLODipine (NORVASC) 5 MG tablet, TAKE 1 TABLET(5 MG) BY MOUTH DAILY, Disp: 90 tablet, Rfl: 1   atorvastatin (LIPITOR) 40 MG tablet, TAKE 1 TABLET(40 MG) BY MOUTH DAILY, Disp: 90 tablet, Rfl: 1   cholecalciferol (VITAMIN D3) 25 MCG (1000 UT) tablet, Take 1,000 Units by mouth daily., Disp: , Rfl:    hydrochlorothiazide (HYDRODIURIL) 25 MG tablet, TAKE 1 TABLET(25 MG) BY MOUTH DAILY, Disp: 90 tablet, Rfl: 1   metFORMIN (GLUCOPHAGE-XR) 750 MG 24 hr tablet, Take 750 mg by mouth 2 (two) times daily., Disp: , Rfl:    omeprazole (PRILOSEC OTC) 20 MG tablet, Take 20 mg by mouth daily., Disp: , Rfl:    pramipexole (MIRAPEX) 0.5 MG tablet, TAKE 1 TABLET(0.5 MG) BY MOUTH AT BEDTIME AS NEEDED, Disp: 90 tablet, Rfl: 1   Prenatal Vit-Fe Fumarate-FA (PRENATAL MULTIVITAMIN) TABS tablet, Take 1 tablet by mouth daily at 12 noon. (Patient not taking: Reported on 06/16/2022), Disp: , Rfl:    Semaglutide, 1 MG/DOSE, 4 MG/3ML SOPN, Inject 1 mg into the skin once a week., Disp: , Rfl:    sertraline (ZOLOFT) 100 MG tablet, TAKE 1 TABLET(100 MG) BY MOUTH DAILY, Disp: 90 tablet, Rfl: 1  Observations/Objective: Patient is well-developed, well-nourished in no acute distress.  Resting comfortably at home.  Head is normocephalic, atraumatic.  No labored breathing.  Speech is clear and coherent with logical content.  Patient is alert and oriented at baseline.    Assessment and Plan: 1. Acute bacterial bronchitis - azithromycin (ZITHROMAX) 250 MG tablet; Take 2 tablets on day 1, then 1 tablet daily on days 2  through 5  Dispense: 6 tablet; Refill: 0 - predniSONE (STERAPRED UNI-PAK 21 TAB) 10 MG (21) TBPK tablet; 6 day taper; take as directed on package instructions  Dispense: 21 tablet; Refill: 0 - benzonatate (TESSALON) 100 MG capsule; Take 1 capsule (100 mg total) by mouth 3 (three) times daily as needed.  Dispense: 30 capsule; Refill: 0 - promethazine-dextromethorphan (PROMETHAZINE-DM) 6.25-15 MG/5ML syrup; Take 5 mLs by mouth 4 (four) times daily as needed.  Dispense: 118 mL; Refill: 0  - Worsening over a week despite OTC medications - Will treat with Z-pack, Prednisone, Promethazine DM and tessalon perles - Can continue Mucinex  - Push fluids.  - Rest.  - Steam and humidifier can help - Seek in person evaluation if worsening or symptoms fail to improve    Follow Up Instructions: I discussed the assessment and treatment plan with the patient. The patient was provided an opportunity to ask questions and all were answered. The patient agreed with the plan and demonstrated an understanding of the instructions.  A copy  of instructions were sent to the patient via MyChart unless otherwise noted below.    The patient was advised to call back or seek an in-person evaluation if the symptoms worsen or if the condition fails to improve as anticipated.  Time:  I spent 10 minutes with the patient via telehealth technology discussing the above problems/concerns.    Mar Daring, PA-C

## 2022-07-07 DIAGNOSIS — Z6831 Body mass index (BMI) 31.0-31.9, adult: Secondary | ICD-10-CM | POA: Diagnosis not present

## 2022-07-07 DIAGNOSIS — R7303 Prediabetes: Secondary | ICD-10-CM | POA: Diagnosis not present

## 2022-07-14 DIAGNOSIS — I83892 Varicose veins of left lower extremities with other complications: Secondary | ICD-10-CM | POA: Diagnosis not present

## 2022-07-15 DIAGNOSIS — Z6831 Body mass index (BMI) 31.0-31.9, adult: Secondary | ICD-10-CM | POA: Diagnosis not present

## 2022-07-15 DIAGNOSIS — D649 Anemia, unspecified: Secondary | ICD-10-CM | POA: Diagnosis not present

## 2022-07-15 DIAGNOSIS — I83892 Varicose veins of left lower extremities with other complications: Secondary | ICD-10-CM | POA: Diagnosis not present

## 2022-07-15 DIAGNOSIS — Z09 Encounter for follow-up examination after completed treatment for conditions other than malignant neoplasm: Secondary | ICD-10-CM | POA: Diagnosis not present

## 2022-07-21 DIAGNOSIS — E7800 Pure hypercholesterolemia, unspecified: Secondary | ICD-10-CM | POA: Diagnosis not present

## 2022-07-21 DIAGNOSIS — Z6831 Body mass index (BMI) 31.0-31.9, adult: Secondary | ICD-10-CM | POA: Diagnosis not present

## 2022-07-29 DIAGNOSIS — D649 Anemia, unspecified: Secondary | ICD-10-CM | POA: Diagnosis not present

## 2022-07-29 DIAGNOSIS — I83891 Varicose veins of right lower extremities with other complications: Secondary | ICD-10-CM | POA: Diagnosis not present

## 2022-07-29 DIAGNOSIS — Z6831 Body mass index (BMI) 31.0-31.9, adult: Secondary | ICD-10-CM | POA: Diagnosis not present

## 2022-08-06 DIAGNOSIS — R7303 Prediabetes: Secondary | ICD-10-CM | POA: Diagnosis not present

## 2022-08-06 DIAGNOSIS — I83891 Varicose veins of right lower extremities with other complications: Secondary | ICD-10-CM | POA: Diagnosis not present

## 2022-08-06 DIAGNOSIS — Z09 Encounter for follow-up examination after completed treatment for conditions other than malignant neoplasm: Secondary | ICD-10-CM | POA: Diagnosis not present

## 2022-08-06 DIAGNOSIS — Z6830 Body mass index (BMI) 30.0-30.9, adult: Secondary | ICD-10-CM | POA: Diagnosis not present

## 2022-08-12 DIAGNOSIS — Z6830 Body mass index (BMI) 30.0-30.9, adult: Secondary | ICD-10-CM | POA: Diagnosis not present

## 2022-08-12 DIAGNOSIS — N951 Menopausal and female climacteric states: Secondary | ICD-10-CM | POA: Diagnosis not present

## 2022-08-12 DIAGNOSIS — E782 Mixed hyperlipidemia: Secondary | ICD-10-CM | POA: Diagnosis not present

## 2022-08-12 DIAGNOSIS — R7303 Prediabetes: Secondary | ICD-10-CM | POA: Diagnosis not present

## 2022-08-12 DIAGNOSIS — E611 Iron deficiency: Secondary | ICD-10-CM | POA: Diagnosis not present

## 2022-08-12 DIAGNOSIS — E7800 Pure hypercholesterolemia, unspecified: Secondary | ICD-10-CM | POA: Diagnosis not present

## 2022-08-18 ENCOUNTER — Other Ambulatory Visit: Payer: Self-pay | Admitting: Internal Medicine

## 2022-08-18 ENCOUNTER — Encounter: Payer: Self-pay | Admitting: Internal Medicine

## 2022-08-18 DIAGNOSIS — E782 Mixed hyperlipidemia: Secondary | ICD-10-CM | POA: Diagnosis not present

## 2022-08-18 DIAGNOSIS — Z6830 Body mass index (BMI) 30.0-30.9, adult: Secondary | ICD-10-CM | POA: Diagnosis not present

## 2022-08-18 DIAGNOSIS — R7303 Prediabetes: Secondary | ICD-10-CM | POA: Diagnosis not present

## 2022-08-18 DIAGNOSIS — E876 Hypokalemia: Secondary | ICD-10-CM | POA: Diagnosis not present

## 2022-08-18 DIAGNOSIS — F411 Generalized anxiety disorder: Secondary | ICD-10-CM

## 2022-08-18 DIAGNOSIS — I10 Essential (primary) hypertension: Secondary | ICD-10-CM

## 2022-08-18 MED ORDER — POTASSIUM CHLORIDE CRYS ER 20 MEQ PO TBCR: 20.0000 meq | EXTENDED_RELEASE_TABLET | Freq: Two times a day (BID) | ORAL | 0 refills | Status: DC

## 2022-08-18 NOTE — Telephone Encounter (Signed)
Spoke with patient. Lab appointment and orders placed

## 2022-08-20 DIAGNOSIS — I83892 Varicose veins of left lower extremities with other complications: Secondary | ICD-10-CM | POA: Diagnosis not present

## 2022-08-25 DIAGNOSIS — R7303 Prediabetes: Secondary | ICD-10-CM | POA: Diagnosis not present

## 2022-08-25 DIAGNOSIS — Z6829 Body mass index (BMI) 29.0-29.9, adult: Secondary | ICD-10-CM | POA: Diagnosis not present

## 2022-09-02 ENCOUNTER — Other Ambulatory Visit (INDEPENDENT_AMBULATORY_CARE_PROVIDER_SITE_OTHER): Payer: BC Managed Care – PPO

## 2022-09-02 DIAGNOSIS — E876 Hypokalemia: Secondary | ICD-10-CM

## 2022-09-02 LAB — MAGNESIUM: Magnesium: 1.8 mg/dL (ref 1.5–2.5)

## 2022-09-03 ENCOUNTER — Other Ambulatory Visit: Payer: Self-pay | Admitting: Internal Medicine

## 2022-09-03 DIAGNOSIS — E876 Hypokalemia: Secondary | ICD-10-CM

## 2022-09-03 LAB — BASIC METABOLIC PANEL WITH GFR
BUN: 16 mg/dL (ref 7–25)
CO2: 24 mmol/L (ref 20–32)
Calcium: 10.2 mg/dL (ref 8.6–10.2)
Chloride: 101 mmol/L (ref 98–110)
Creat: 0.83 mg/dL (ref 0.50–0.99)
Glucose, Bld: 99 mg/dL (ref 65–99)
Potassium: 3.4 mmol/L — ABNORMAL LOW (ref 3.5–5.3)
Sodium: 140 mmol/L (ref 135–146)
eGFR: 90 mL/min/{1.73_m2} (ref >=60)

## 2022-09-03 MED ORDER — POTASSIUM CHLORIDE CRYS ER 20 MEQ PO TBCR: 20.0000 meq | EXTENDED_RELEASE_TABLET | Freq: Two times a day (BID) | ORAL | 0 refills | Status: DC

## 2022-09-04 DIAGNOSIS — Z1231 Encounter for screening mammogram for malignant neoplasm of breast: Secondary | ICD-10-CM | POA: Diagnosis not present

## 2022-09-04 DIAGNOSIS — Z6828 Body mass index (BMI) 28.0-28.9, adult: Secondary | ICD-10-CM | POA: Diagnosis not present

## 2022-09-04 DIAGNOSIS — Z01419 Encounter for gynecological examination (general) (routine) without abnormal findings: Secondary | ICD-10-CM | POA: Diagnosis not present

## 2022-09-04 DIAGNOSIS — Z124 Encounter for screening for malignant neoplasm of cervix: Secondary | ICD-10-CM | POA: Diagnosis not present

## 2022-09-04 LAB — HM MAMMOGRAPHY

## 2022-09-05 LAB — HM PAP SMEAR

## 2022-09-08 DIAGNOSIS — E611 Iron deficiency: Secondary | ICD-10-CM | POA: Diagnosis not present

## 2022-09-08 DIAGNOSIS — Z6829 Body mass index (BMI) 29.0-29.9, adult: Secondary | ICD-10-CM | POA: Diagnosis not present

## 2022-09-09 ENCOUNTER — Other Ambulatory Visit: Payer: Self-pay | Admitting: Internal Medicine

## 2022-09-09 DIAGNOSIS — G2581 Restless legs syndrome: Secondary | ICD-10-CM

## 2022-09-10 ENCOUNTER — Other Ambulatory Visit (INDEPENDENT_AMBULATORY_CARE_PROVIDER_SITE_OTHER): Payer: BC Managed Care – PPO

## 2022-09-10 ENCOUNTER — Encounter: Payer: Self-pay | Admitting: Internal Medicine

## 2022-09-10 DIAGNOSIS — I87391 Chronic venous hypertension (idiopathic) with other complications of right lower extremity: Secondary | ICD-10-CM | POA: Diagnosis not present

## 2022-09-10 DIAGNOSIS — I83891 Varicose veins of right lower extremities with other complications: Secondary | ICD-10-CM | POA: Diagnosis not present

## 2022-09-10 DIAGNOSIS — G2581 Restless legs syndrome: Secondary | ICD-10-CM

## 2022-09-10 DIAGNOSIS — E876 Hypokalemia: Secondary | ICD-10-CM

## 2022-09-10 LAB — BASIC METABOLIC PANEL
BUN: 14 mg/dL (ref 6–23)
CO2: 27 mEq/L (ref 19–32)
Calcium: 9.6 mg/dL (ref 8.4–10.5)
Chloride: 102 mEq/L (ref 96–112)
Creatinine, Ser: 0.75 mg/dL (ref 0.40–1.20)
GFR: 97.37 mL/min (ref >60.00)
Glucose, Bld: 87 mg/dL (ref 70–99)
Potassium: 3.3 mEq/L — ABNORMAL LOW (ref 3.5–5.1)
Sodium: 137 mEq/L (ref 135–145)

## 2022-09-11 ENCOUNTER — Other Ambulatory Visit: Payer: Self-pay | Admitting: Adult Health

## 2022-09-11 DIAGNOSIS — G2581 Restless legs syndrome: Secondary | ICD-10-CM

## 2022-09-11 MED ORDER — PRAMIPEXOLE DIHYDROCHLORIDE 0.5 MG PO TABS: ORAL_TABLET | ORAL | 0 refills | Status: DC

## 2022-09-12 ENCOUNTER — Encounter: Payer: Self-pay | Admitting: Internal Medicine

## 2022-09-16 ENCOUNTER — Other Ambulatory Visit: Payer: Self-pay | Admitting: Internal Medicine

## 2022-09-16 DIAGNOSIS — E876 Hypokalemia: Secondary | ICD-10-CM

## 2022-09-16 MED ORDER — POTASSIUM CHLORIDE ER 10 MEQ PO TBCR: 10.0000 meq | EXTENDED_RELEASE_TABLET | Freq: Every day | ORAL | 1 refills | Status: DC

## 2022-09-22 DIAGNOSIS — Z6830 Body mass index (BMI) 30.0-30.9, adult: Secondary | ICD-10-CM | POA: Diagnosis not present

## 2022-09-22 DIAGNOSIS — E782 Mixed hyperlipidemia: Secondary | ICD-10-CM | POA: Diagnosis not present

## 2022-09-29 DIAGNOSIS — Z6829 Body mass index (BMI) 29.0-29.9, adult: Secondary | ICD-10-CM | POA: Diagnosis not present

## 2022-09-29 DIAGNOSIS — E782 Mixed hyperlipidemia: Secondary | ICD-10-CM | POA: Diagnosis not present

## 2022-10-07 DIAGNOSIS — I83892 Varicose veins of left lower extremities with other complications: Secondary | ICD-10-CM | POA: Diagnosis not present

## 2022-10-08 DIAGNOSIS — I872 Venous insufficiency (chronic) (peripheral): Secondary | ICD-10-CM | POA: Diagnosis not present

## 2022-10-09 ENCOUNTER — Other Ambulatory Visit: Payer: Self-pay | Admitting: Adult Health

## 2022-10-09 ENCOUNTER — Other Ambulatory Visit: Payer: Self-pay | Admitting: Internal Medicine

## 2022-10-09 DIAGNOSIS — G2581 Restless legs syndrome: Secondary | ICD-10-CM

## 2022-10-09 DIAGNOSIS — I10 Essential (primary) hypertension: Secondary | ICD-10-CM

## 2022-10-13 DIAGNOSIS — E782 Mixed hyperlipidemia: Secondary | ICD-10-CM | POA: Diagnosis not present

## 2022-10-13 DIAGNOSIS — Z6829 Body mass index (BMI) 29.0-29.9, adult: Secondary | ICD-10-CM | POA: Diagnosis not present

## 2022-10-14 ENCOUNTER — Encounter: Payer: Self-pay | Admitting: Internal Medicine

## 2022-10-14 ENCOUNTER — Other Ambulatory Visit (INDEPENDENT_AMBULATORY_CARE_PROVIDER_SITE_OTHER): Payer: BC Managed Care – PPO

## 2022-10-14 ENCOUNTER — Other Ambulatory Visit: Payer: Self-pay | Admitting: Internal Medicine

## 2022-10-14 DIAGNOSIS — E876 Hypokalemia: Secondary | ICD-10-CM

## 2022-10-14 LAB — BASIC METABOLIC PANEL
BUN: 18 mg/dL (ref 6–23)
CO2: 24 mEq/L (ref 19–32)
Calcium: 9.5 mg/dL (ref 8.4–10.5)
Chloride: 103 mEq/L (ref 96–112)
Creatinine, Ser: 0.79 mg/dL (ref 0.40–1.20)
GFR: 91.43 mL/min (ref >60.00)
Glucose, Bld: 97 mg/dL (ref 70–99)
Potassium: 3.2 mEq/L — ABNORMAL LOW (ref 3.5–5.1)
Sodium: 138 mEq/L (ref 135–145)

## 2022-10-14 MED ORDER — POTASSIUM CHLORIDE CRYS ER 20 MEQ PO TBCR: 20.0000 meq | EXTENDED_RELEASE_TABLET | Freq: Every day | ORAL | 3 refills | Status: DC

## 2022-10-27 DIAGNOSIS — Z6828 Body mass index (BMI) 28.0-28.9, adult: Secondary | ICD-10-CM | POA: Diagnosis not present

## 2022-10-27 DIAGNOSIS — E782 Mixed hyperlipidemia: Secondary | ICD-10-CM | POA: Diagnosis not present

## 2022-11-05 ENCOUNTER — Other Ambulatory Visit: Payer: Self-pay | Admitting: Adult Health

## 2022-11-05 DIAGNOSIS — G2581 Restless legs syndrome: Secondary | ICD-10-CM

## 2022-11-07 ENCOUNTER — Other Ambulatory Visit: Payer: Self-pay | Admitting: Internal Medicine

## 2022-11-07 DIAGNOSIS — G2581 Restless legs syndrome: Secondary | ICD-10-CM

## 2022-11-10 DIAGNOSIS — L814 Other melanin hyperpigmentation: Secondary | ICD-10-CM | POA: Diagnosis not present

## 2022-11-10 DIAGNOSIS — L821 Other seborrheic keratosis: Secondary | ICD-10-CM | POA: Diagnosis not present

## 2022-11-10 DIAGNOSIS — D2261 Melanocytic nevi of right upper limb, including shoulder: Secondary | ICD-10-CM | POA: Diagnosis not present

## 2022-11-10 DIAGNOSIS — E782 Mixed hyperlipidemia: Secondary | ICD-10-CM | POA: Diagnosis not present

## 2022-11-10 DIAGNOSIS — D225 Melanocytic nevi of trunk: Secondary | ICD-10-CM | POA: Diagnosis not present

## 2022-11-10 DIAGNOSIS — Z6828 Body mass index (BMI) 28.0-28.9, adult: Secondary | ICD-10-CM | POA: Diagnosis not present

## 2022-11-16 ENCOUNTER — Other Ambulatory Visit: Payer: Self-pay | Admitting: Internal Medicine

## 2022-11-16 DIAGNOSIS — E782 Mixed hyperlipidemia: Secondary | ICD-10-CM

## 2022-11-16 DIAGNOSIS — F411 Generalized anxiety disorder: Secondary | ICD-10-CM

## 2022-11-16 DIAGNOSIS — I10 Essential (primary) hypertension: Secondary | ICD-10-CM

## 2022-11-25 DIAGNOSIS — Z6828 Body mass index (BMI) 28.0-28.9, adult: Secondary | ICD-10-CM | POA: Diagnosis not present

## 2022-11-25 DIAGNOSIS — E782 Mixed hyperlipidemia: Secondary | ICD-10-CM | POA: Diagnosis not present

## 2022-12-01 ENCOUNTER — Ambulatory Visit (INDEPENDENT_AMBULATORY_CARE_PROVIDER_SITE_OTHER): Payer: BC Managed Care – PPO | Admitting: Internal Medicine

## 2022-12-01 ENCOUNTER — Encounter: Payer: Self-pay | Admitting: Internal Medicine

## 2022-12-01 VITALS — BP 110/80 | HR 82 | Temp 98.5°F | Ht 65.0 in | Wt 169.6 lb

## 2022-12-01 DIAGNOSIS — E559 Vitamin D deficiency, unspecified: Secondary | ICD-10-CM | POA: Diagnosis not present

## 2022-12-01 DIAGNOSIS — E782 Mixed hyperlipidemia: Secondary | ICD-10-CM | POA: Diagnosis not present

## 2022-12-01 DIAGNOSIS — E876 Hypokalemia: Secondary | ICD-10-CM

## 2022-12-01 DIAGNOSIS — Z0000 Encounter for general adult medical examination without abnormal findings: Secondary | ICD-10-CM

## 2022-12-01 DIAGNOSIS — G2581 Restless legs syndrome: Secondary | ICD-10-CM

## 2022-12-01 DIAGNOSIS — I10 Essential (primary) hypertension: Secondary | ICD-10-CM | POA: Diagnosis not present

## 2022-12-01 DIAGNOSIS — Z1159 Encounter for screening for other viral diseases: Secondary | ICD-10-CM | POA: Diagnosis not present

## 2022-12-01 LAB — COMPREHENSIVE METABOLIC PANEL
ALT: 12 U/L (ref 0–35)
AST: 13 U/L (ref 0–37)
Albumin: 4.5 g/dL (ref 3.5–5.2)
Alkaline Phosphatase: 51 U/L (ref 39–117)
BUN: 15 mg/dL (ref 6–23)
CO2: 24 mEq/L (ref 19–32)
Calcium: 10.7 mg/dL — ABNORMAL HIGH (ref 8.4–10.5)
Chloride: 102 mEq/L (ref 96–112)
Creatinine, Ser: 0.86 mg/dL (ref 0.40–1.20)
GFR: 82.49 mL/min (ref >60.00)
Glucose, Bld: 90 mg/dL (ref 70–99)
Potassium: 3.3 mEq/L — ABNORMAL LOW (ref 3.5–5.1)
Sodium: 139 mEq/L (ref 135–145)
Total Bilirubin: 0.5 mg/dL (ref 0.2–1.2)
Total Protein: 7.3 g/dL (ref 6.0–8.3)

## 2022-12-01 LAB — CBC WITH DIFFERENTIAL/PLATELET
Basophils Absolute: 0 10*3/uL (ref 0.0–0.1)
Basophils Relative: 0.3 % (ref 0.0–3.0)
Eosinophils Absolute: 0.2 10*3/uL (ref 0.0–0.7)
Eosinophils Relative: 2 % (ref 0.0–5.0)
HCT: 41 % (ref 36.0–46.0)
Hemoglobin: 13.5 g/dL (ref 12.0–15.0)
Lymphocytes Relative: 30.3 % (ref 12.0–46.0)
Lymphs Abs: 2.4 10*3/uL (ref 0.7–4.0)
MCHC: 32.9 g/dL (ref 30.0–36.0)
MCV: 81.6 fl (ref 78.0–100.0)
Monocytes Absolute: 0.9 10*3/uL (ref 0.1–1.0)
Monocytes Relative: 11.4 % (ref 3.0–12.0)
Neutro Abs: 4.5 10*3/uL (ref 1.4–7.7)
Neutrophils Relative %: 56 % (ref 43.0–77.0)
Platelets: 386 10*3/uL (ref 150.0–400.0)
RBC: 5.02 Mil/uL (ref 3.87–5.11)
RDW: 14.2 % (ref 11.5–15.5)
WBC: 8.1 10*3/uL (ref 4.0–10.5)

## 2022-12-01 LAB — LDL CHOLESTEROL, DIRECT: Direct LDL: 191 mg/dL

## 2022-12-01 LAB — VITAMIN D 25 HYDROXY (VIT D DEFICIENCY, FRACTURES): VITD: 26.9 ng/mL — ABNORMAL LOW (ref 30.00–100.00)

## 2022-12-01 LAB — LIPID PANEL
Cholesterol: 300 mg/dL — ABNORMAL HIGH (ref 0–200)
HDL: 48.9 mg/dL (ref >39.00)
NonHDL: 251.05
Total CHOL/HDL Ratio: 6
Triglycerides: 348 mg/dL — ABNORMAL HIGH (ref 0.0–149.0)
VLDL: 69.6 mg/dL — ABNORMAL HIGH (ref 0.0–40.0)

## 2022-12-01 LAB — VITAMIN B12: Vitamin B-12: 1282 pg/mL — ABNORMAL HIGH (ref 211–911)

## 2022-12-01 LAB — TSH: TSH: 2.62 u[IU]/mL (ref 0.35–5.50)

## 2022-12-01 NOTE — Progress Notes (Signed)
Established Patient Office Visit     CC/Reason for Visit: Annual preventive exam and follow-up chronic issues  HPI: Alexis Craig is a 42 y.o. female who is coming in today for the above mentioned reasons. Past Medical History is significant for: Hypertension, hyperlipidemia, vitamin D deficiency, generalized anxiety disorder, restless leg syndrome.  She has continued issues with restless leg syndrome that have impaired her sleeping.  Requesting referral to neurology.  Has routine eye and dental care.  Immunizations are up-to-date.  She follows with GYN who does her cervical and breast cancer screening.   Past Medical/Surgical History: Past Medical History:  Diagnosis Date   Anal fissure 04/09/2009   ANXIETY 03/09/2008   Depression    GERD (gastroesophageal reflux disease)    Gestational diabetes    metformin   History of alcohol abuse    7 years ago   Hx of varicella    INSOMNIA 10/05/2007   INTENTION TREMOR 06/13/2008   NEPHROLITHIASIS, HX OF 08/23/2007   passed stone, no surgery   RECTAL BLEEDING 04/09/2009   RESTLESS LEG SYNDROME 01/22/2009    Past Surgical History:  Procedure Laterality Date   DILATION AND EVACUATION N/A 03/31/2019   Procedure: DILATATION AND EVACUATION with genetic studies;  Surgeon: Candice Camp, MD;  Location: [facility];  Service: Gynecology;  Laterality: N/A;  with genetic studies   EXCISIONAL HEMORRHOIDECTOMY     KIDNEY STONE SURGERY Right 07/2020   KIDNEY STONE SURGERY Left 08/2020   WISDOM TOOTH EXTRACTION      Social History:  reports that she quit smoking about 11 years ago. Her smoking use included cigarettes. She has a 6.00 pack-year smoking history. She has never used smokeless tobacco. She reports that she does not drink alcohol and does not use drugs.  Allergies: Allergies  Allergen Reactions   Nsaids Rash    Blisters also   Ibuprofen     Fixed drug reaction with bullous dermatitis    Family History:  Family History   Problem Relation Age of Onset   Diabetes Mother    Thyroid disease Mother    Cancer Mother    Heart disease Mother    Hyperlipidemia Father    Diabetes Father    Heart disease Maternal Uncle    Cancer Maternal Grandmother    Heart disease Paternal Grandfather      Current Outpatient Medications:    acetaminophen (TYLENOL) 500 MG tablet, Take 500 mg by mouth every 6 (six) hours as needed for moderate pain. , Disp: , Rfl:    ALPRAZolam (XANAX) 0.5 MG tablet, Take 1 tablet (0.5 mg total) by mouth daily as needed for anxiety (for airplane travel)., Disp: 20 tablet, Rfl: 0   amLODipine (NORVASC) 5 MG tablet, TAKE 1 TABLET(5 MG) BY MOUTH DAILY, Disp: 90 tablet, Rfl: 0   atorvastatin (LIPITOR) 40 MG tablet, TAKE 1 TABLET(40 MG) BY MOUTH DAILY, Disp: 90 tablet, Rfl: 0   cholecalciferol (VITAMIN D3) 25 MCG (1000 UT) tablet, Take 1,000 Units by mouth daily., Disp: , Rfl:    hydrochlorothiazide (HYDRODIURIL) 25 MG tablet, TAKE 1 TABLET(25 MG) BY MOUTH DAILY, Disp: 90 tablet, Rfl: 1   metFORMIN (GLUCOPHAGE-XR) 750 MG 24 hr tablet, Take 750 mg by mouth 2 (two) times daily., Disp: , Rfl:    omeprazole (PRILOSEC OTC) 20 MG tablet, Take 20 mg by mouth daily., Disp: , Rfl:    potassium chloride SA (KLOR-CON M) 20 MEQ tablet, Take 1 tablet (20 mEq total) by mouth  daily., Disp: 30 tablet, Rfl: 3   pramipexole (MIRAPEX) 0.5 MG tablet, TAKE 1 TABLET(0.5 MG) BY MOUTH AT BEDTIME AS NEEDED, Disp: 90 tablet, Rfl: 0   Semaglutide, 1 MG/DOSE, 4 MG/3ML SOPN, Inject 1 mg into the skin once a week., Disp: , Rfl:    sertraline (ZOLOFT) 100 MG tablet, TAKE 1 TABLET(100 MG) BY MOUTH DAILY, Disp: 90 tablet, Rfl: 0  Review of Systems:  Negative unless indicated in HPI.   Physical Exam: Vitals:   12/01/22 0758  BP: 110/80  Pulse: 82  Temp: 98.5 F (36.9 C)  TempSrc: Oral  SpO2: 98%  Weight: 169 lb 9.6 oz (76.9 kg)  Height: 5\' 5"  (1.651 m)    Body mass index is 28.22 kg/m.   Physical Exam Vitals  reviewed.  Constitutional:      General: She is not in acute distress.    Appearance: Normal appearance. She is not ill-appearing, toxic-appearing or diaphoretic.  HENT:     Head: Normocephalic.     Right Ear: Tympanic membrane, ear canal and external ear normal. There is no impacted cerumen.     Left Ear: Tympanic membrane, ear canal and external ear normal. There is no impacted cerumen.     Nose: Nose normal.     Mouth/Throat:     Mouth: Mucous membranes are moist.     Pharynx: Oropharynx is clear. No oropharyngeal exudate or posterior oropharyngeal erythema.  Eyes:     General: No scleral icterus.       Right eye: No discharge.        Left eye: No discharge.     Conjunctiva/sclera: Conjunctivae normal.     Pupils: Pupils are equal, round, and reactive to light.  Neck:     Vascular: No carotid bruit.  Cardiovascular:     Rate and Rhythm: Normal rate and regular rhythm.     Pulses: Normal pulses.     Heart sounds: Normal heart sounds.  Pulmonary:     Effort: Pulmonary effort is normal. No respiratory distress.     Breath sounds: Normal breath sounds.  Abdominal:     General: Abdomen is flat. Bowel sounds are normal.     Palpations: Abdomen is soft.  Musculoskeletal:        General: Normal range of motion.     Cervical back: Normal range of motion.  Skin:    General: Skin is warm and dry.  Neurological:     General: No focal deficit present.     Mental Status: She is alert and oriented to person, place, and time. Mental status is at baseline.  Psychiatric:        Mood and Affect: Mood normal.        Behavior: Behavior normal.        Thought Content: Thought content normal.        Judgment: Judgment normal.       Impression and Plan:  Encounter for preventive health examination  Primary hypertension -     CBC with Differential/Platelet; Future -     Comprehensive metabolic panel; Future  Mixed hyperlipidemia -     Lipid panel; Future  Vitamin D deficiency -      VITAMIN D 25 Hydroxy (Vit-D Deficiency, Fractures); Future  Hypokalemia  RESTLESS LEG SYNDROME -     TSH; Future -     Vitamin B12; Future -     Ambulatory referral to Neurology  Encounter for hepatitis C screening test for low risk patient -  Hepatitis C antibody; Future   -Recommend routine eye and dental care. -Healthy lifestyle discussed in detail. -Labs to be updated today. -Prostate cancer screening: N/A Health Maintenance  Topic Date Due   Hepatitis C Screening  Never done   COVID-19 Vaccine (5 - 2023-24 season) 02/28/2022   Pap Smear  01/29/2023   Flu Shot  01/29/2023   DTaP/Tdap/Td vaccine (2 - Td or Tdap) 02/03/2024   HIV Screening  Completed   HPV Vaccine  Aged Out    -Neuro referral for her RLS uncontrolled on pramipexole.    Chaya Jan, MD [facility]

## 2022-12-02 DIAGNOSIS — R600 Localized edema: Secondary | ICD-10-CM | POA: Diagnosis not present

## 2022-12-02 DIAGNOSIS — M7989 Other specified soft tissue disorders: Secondary | ICD-10-CM | POA: Diagnosis not present

## 2022-12-02 DIAGNOSIS — R252 Cramp and spasm: Secondary | ICD-10-CM | POA: Diagnosis not present

## 2022-12-02 DIAGNOSIS — I87393 Chronic venous hypertension (idiopathic) with other complications of bilateral lower extremity: Secondary | ICD-10-CM | POA: Diagnosis not present

## 2022-12-02 LAB — HEPATITIS C ANTIBODY: Hepatitis C Ab: NONREACTIVE

## 2022-12-03 ENCOUNTER — Other Ambulatory Visit: Payer: Self-pay | Admitting: Internal Medicine

## 2022-12-03 ENCOUNTER — Encounter: Payer: Self-pay | Admitting: Internal Medicine

## 2022-12-03 DIAGNOSIS — E559 Vitamin D deficiency, unspecified: Secondary | ICD-10-CM | POA: Insufficient documentation

## 2022-12-03 DIAGNOSIS — E876 Hypokalemia: Secondary | ICD-10-CM | POA: Insufficient documentation

## 2022-12-03 MED ORDER — VITAMIN D (ERGOCALCIFEROL) 1.25 MG (50000 UNIT) PO CAPS
50000.0000 [IU] | ORAL_CAPSULE | ORAL | 0 refills | Status: AC
Start: 2022-12-03 — End: 2023-02-19

## 2022-12-04 ENCOUNTER — Telehealth: Payer: BC Managed Care – PPO | Admitting: Internal Medicine

## 2022-12-04 VITALS — Wt 167.0 lb

## 2022-12-04 DIAGNOSIS — I10 Essential (primary) hypertension: Secondary | ICD-10-CM | POA: Diagnosis not present

## 2022-12-04 DIAGNOSIS — E876 Hypokalemia: Secondary | ICD-10-CM

## 2022-12-04 DIAGNOSIS — E782 Mixed hyperlipidemia: Secondary | ICD-10-CM | POA: Diagnosis not present

## 2022-12-04 DIAGNOSIS — E559 Vitamin D deficiency, unspecified: Secondary | ICD-10-CM

## 2022-12-04 MED ORDER — VALSARTAN 160 MG PO TABS
160.0000 mg | ORAL_TABLET | Freq: Every day | ORAL | 1 refills | Status: DC
Start: 2022-12-04 — End: 2023-06-01

## 2022-12-04 NOTE — Progress Notes (Signed)
Virtual Visit via Video Note  I connected with Alexis Craig on 12/04/22 at  4:00 PM EDT by a video enabled telemedicine application and verified that I am speaking with the correct person using two identifiers.  Location patient: home Location provider: work office Persons participating in the virtual visit: patient, provider  I discussed the limitations of evaluation and management by telemedicine and the availability of in person appointments. The patient expressed understanding and agreed to proceed.   HPI: This visit was scheduled as a follow-up to recent abnormal labs.  She was found to be vitamin D deficient, she was also found to have significantly elevated cholesterol levels with a total cholesterol of 300, triglycerides 348, HDL 48 and LDL 191.  She has been taking atorvastatin 40 mg 3 times a week.  She was again found to be hypokalemic and this has been a recurrent issue for her presumably secondary to hydrochlorothiazide despite being on a daily potassium supplement.  She was also found to be hypercalcemic with a calcium level of 10.7 with normal albumin of 4.5.   ROS: Negative unless indicated in HPI.  Past Medical History:  Diagnosis Date   Anal fissure 04/09/2009   ANXIETY 03/09/2008   Depression    GERD (gastroesophageal reflux disease)    Gestational diabetes    metformin   History of alcohol abuse    7 years ago   Hx of varicella    INSOMNIA 10/05/2007   INTENTION TREMOR 06/13/2008   NEPHROLITHIASIS, HX OF 08/23/2007   passed stone, no surgery   RECTAL BLEEDING 04/09/2009   RESTLESS LEG SYNDROME 01/22/2009    Past Surgical History:  Procedure Laterality Date   DILATION AND EVACUATION N/A 03/31/2019   Procedure: DILATATION AND EVACUATION with genetic studies;  Surgeon: Candice Camp, MD;  Location: [facility] OR;  Service: Gynecology;  Laterality: N/A;  with genetic studies   EXCISIONAL HEMORRHOIDECTOMY     KIDNEY STONE SURGERY Right 07/2020   KIDNEY STONE  SURGERY Left 08/2020   WISDOM TOOTH EXTRACTION      Family History  Problem Relation Age of Onset   Diabetes Mother    Thyroid disease Mother    Cancer Mother    Heart disease Mother    Hyperlipidemia Father    Diabetes Father    Heart disease Maternal Uncle    Cancer Maternal Grandmother    Heart disease Paternal Grandfather     SOCIAL HX:   reports that she quit smoking about 11 years ago. Her smoking use included cigarettes. She has a 6.00 pack-year smoking history. She has never used smokeless tobacco. She reports that she does not drink alcohol and does not use drugs.   Current Outpatient Medications:    acetaminophen (TYLENOL) 500 MG tablet, Take 500 mg by mouth every 6 (six) hours as needed for moderate pain. , Disp: , Rfl:    ALPRAZolam (XANAX) 0.5 MG tablet, Take 1 tablet (0.5 mg total) by mouth daily as needed for anxiety (for airplane travel)., Disp: 20 tablet, Rfl: 0   amLODipine (NORVASC) 5 MG tablet, TAKE 1 TABLET(5 MG) BY MOUTH DAILY, Disp: 90 tablet, Rfl: 0   atorvastatin (LIPITOR) 40 MG tablet, TAKE 1 TABLET(40 MG) BY MOUTH DAILY, Disp: 90 tablet, Rfl: 0   cholecalciferol (VITAMIN D3) 25 MCG (1000 UT) tablet, Take 1,000 Units by mouth daily., Disp: , Rfl:    metFORMIN (GLUCOPHAGE-XR) 750 MG 24 hr tablet, Take 750 mg by mouth 2 (two) times daily., Disp: ,  Rfl:    omeprazole (PRILOSEC OTC) 20 MG tablet, Take 20 mg by mouth daily., Disp: , Rfl:    pramipexole (MIRAPEX) 0.5 MG tablet, TAKE 1 TABLET(0.5 MG) BY MOUTH AT BEDTIME AS NEEDED, Disp: 90 tablet, Rfl: 0   Semaglutide, 1 MG/DOSE, 4 MG/3ML SOPN, Inject 1 mg into the skin once a week., Disp: , Rfl:    sertraline (ZOLOFT) 100 MG tablet, TAKE 1 TABLET(100 MG) BY MOUTH DAILY, Disp: 90 tablet, Rfl: 0   valsartan (DIOVAN) 160 MG tablet, Take 1 tablet (160 mg total) by mouth daily., Disp: 90 tablet, Rfl: 1   Vitamin D, Ergocalciferol, (DRISDOL) 1.25 MG (50000 UNIT) CAPS capsule, Take 1 capsule (50,000 Units total) by mouth  every 7 (seven) days for 12 doses., Disp: 12 capsule, Rfl: 0  EXAM:   VITALS per patient if applicable:   GENERAL: alert, oriented, appears well and in no acute distress  HEENT: atraumatic, conjunttiva clear, no obvious abnormalities on inspection of external nose and ears  NECK: normal movements of the head and neck  LUNGS: on inspection no signs of respiratory distress, breathing rate appears normal, no obvious gross increased work of breathing, gasping or wheezing  CV: no obvious cyanosis  MS: moves all visible extremities without noticeable abnormality  PSYCH/NEURO: pleasant and cooperative, no obvious depression or anxiety, speech and thought processing grossly intact  ASSESSMENT AND PLAN:   Mixed hyperlipidemia  Hypokalemia  Vitamin D deficiency  Hypercalcemia  Primary hypertension - Plan: valsartan (DIOVAN) 160 MG tablet  -12 weeks of high-dose vitamin D will be prescribed and she will return in 3 months to recheck levels. -I suspect hypercalcemia is likely related to vitamin D deficiency.  She is not taking any calcium supplements or multivitamins. -Because her recurrent hypokalemia is likely related to hydrochlorothiazide, we will discontinue both this and her potassium supplement.  I will substitute with valsartan 160 mg daily.  She will keep close monitoring of blood pressure and will return in 3 months for follow-up of both hypertension and potassium levels. -She is significantly dyslipidemic.  She will increase atorvastatin to 40 mg daily instead of 3 days a week and we will recheck lipid panels in 3 months to see if further dose adjustments are needed.    I discussed the assessment and treatment plan with the patient. The patient was provided an opportunity to ask questions and all were answered. The patient agreed with the plan and demonstrated an understanding of the instructions.   The patient was advised to call back or seek an in-person evaluation if the  symptoms worsen or if the condition fails to improve as anticipated.    Alexis Jan, MD  [facility] at [facility]

## 2022-12-08 DIAGNOSIS — E782 Mixed hyperlipidemia: Secondary | ICD-10-CM | POA: Diagnosis not present

## 2022-12-08 DIAGNOSIS — Z6829 Body mass index (BMI) 29.0-29.9, adult: Secondary | ICD-10-CM | POA: Diagnosis not present

## 2022-12-22 DIAGNOSIS — Z6829 Body mass index (BMI) 29.0-29.9, adult: Secondary | ICD-10-CM | POA: Diagnosis not present

## 2022-12-22 DIAGNOSIS — M2550 Pain in unspecified joint: Secondary | ICD-10-CM | POA: Diagnosis not present

## 2023-01-08 ENCOUNTER — Encounter: Payer: Self-pay | Admitting: Neurology

## 2023-01-08 ENCOUNTER — Institutional Professional Consult (permissible substitution): Payer: BLUE CROSS/BLUE SHIELD | Admitting: Neurology

## 2023-01-08 ENCOUNTER — Ambulatory Visit: Payer: BC Managed Care – PPO | Admitting: Neurology

## 2023-01-08 VITALS — BP 124/73 | HR 90 | Ht 64.0 in | Wt 180.0 lb

## 2023-01-08 DIAGNOSIS — E669 Obesity, unspecified: Secondary | ICD-10-CM

## 2023-01-08 DIAGNOSIS — R519 Headache, unspecified: Secondary | ICD-10-CM

## 2023-01-08 DIAGNOSIS — E611 Iron deficiency: Secondary | ICD-10-CM | POA: Diagnosis not present

## 2023-01-08 DIAGNOSIS — G478 Other sleep disorders: Secondary | ICD-10-CM

## 2023-01-08 DIAGNOSIS — G479 Sleep disorder, unspecified: Secondary | ICD-10-CM

## 2023-01-08 DIAGNOSIS — G4761 Periodic limb movement disorder: Secondary | ICD-10-CM

## 2023-01-08 DIAGNOSIS — R0683 Snoring: Secondary | ICD-10-CM

## 2023-01-08 DIAGNOSIS — G2581 Restless legs syndrome: Secondary | ICD-10-CM

## 2023-01-08 DIAGNOSIS — M79604 Pain in right leg: Secondary | ICD-10-CM

## 2023-01-08 DIAGNOSIS — M79605 Pain in left leg: Secondary | ICD-10-CM

## 2023-01-08 MED ORDER — GABAPENTIN 100 MG PO CAPS: 100.0000 mg | ORAL_CAPSULE | Freq: Every day | ORAL | 3 refills | Status: DC

## 2023-01-08 NOTE — Progress Notes (Signed)
Subjective:    Patient ID: Alexis Craig is a 42 y.o. female.  HPI    Huston Foley, MD, PhD [facility] [address] [address] [place], [place] [address]  Dear Dr. Philip Aspen,  I saw your patient, Alexis Craig, upon your kind request in my neurologic clinic today for initial consultation of her sleep disorder, in particular, her restless leg syndrome.  The patient is unaccompanied today.  As you know, Alexis Craig is a 42 year old female with an underlying medical history of anxiety, reflux disease, with low vitamin D, migraine headaches, history of kidney stone, and borderline obesity, who has a longstanding history of restless leg symptoms of over 15 years, she has a longer standing history dating back to her childhood of difficulty initiating and maintaining sleep, nonrestorative sleep.  She has been on pramipexole off and on for years.  She is currently on 0.5 mg strength, 1 pill in the afternoon but has taken 1 mg at a time without much in the way of success, she does not feel that the pramipexole is working any longer.  When she was pregnant she was off the pramipexole and was on Flexeril which helped a little bit, it was mostly sedating and helped her sleep a little better.  She is not aware of any family history of restless leg syndrome but mom does not sleep well.  She lives with her family including husband and 5-year-old daughter.  She works for [employer] as [employer].  She quit smoking many years ago, she was not a pack per day smoker.  She does not drink any alcohol, she limits her caffeine to less than 1 cup/day on average.  She tries to be in bed between 9 and 9:30 PM and likes to read in bed, she is probably asleep between 10 and 11 most nights, her rise time is 6:30 AM.  She has no nightly nocturia but has woken up with a headache, does not typically take any medication for headaches.  Of note, she has been on sertraline, she had  to restart it after having a baby due to significant postpartum anxiety.  She does believe that the sertraline may be contributing to her restless leg symptoms.  She also had low ferritin levels in August 2023 at 14.  She is currently not on iron supplementation, she started prescription vitamin D in June after her vitamin D was below 30 at her last blood work, I reviewed blood test results from 12/01/2022.  She has not been on gabapentin.  She has tried over-the-counter melatonin which did not help, over-the-counter p.m. medications exacerbated her leg movements and restless leg symptoms.  She has a lower EXTR move her legs, leg cramping and even painful sensation in her legs now which is quite bothersome.  She occasionally snores, she is not opposed to pursuing a sleep study at this time.  Her Past Medical History Is Significant For: Past Medical History:  Diagnosis Date   Anal fissure 04/09/2009   ANXIETY 03/09/2008   Depression    GERD (gastroesophageal reflux disease)    Gestational diabetes    metformin   High cholesterol    History of alcohol abuse    7 years ago   History of low potassium    Hx of varicella    Hypertension    INSOMNIA 10/05/2007   INTENTION TREMOR 06/13/2008   Low vitamin D level    Migraines    NEPHROLITHIASIS, HX  OF 08/23/2007   passed stone, no surgery   RECTAL BLEEDING 04/09/2009   RESTLESS LEG SYNDROME 01/22/2009    Her Past Surgical History Is Significant For: Past Surgical History:  Procedure Laterality Date   DILATION AND EVACUATION N/A 03/31/2019   Procedure: DILATATION AND EVACUATION with genetic studies;  Surgeon: Candice Camp, MD;  Location: [facility] OR;  Service: Gynecology;  Laterality: N/A;  with genetic studies   EXCISIONAL HEMORRHOIDECTOMY     KIDNEY STONE SURGERY Right 07/2020   KIDNEY STONE SURGERY Left 08/2020   WISDOM TOOTH EXTRACTION      Her Family History Is Significant For: Family History  Problem Relation Age of Onset   Diabetes  Mother    Thyroid disease Mother    Cancer Mother    Heart disease Mother    Hyperlipidemia Father    Diabetes Father    Cancer Maternal Grandmother        colon   Heart disease Paternal Grandfather    Heart disease Maternal Uncle    Restless legs syndrome Neg Hx     Her Social History Is Significant For: Social History   Socioeconomic History   Marital status: Married    Spouse name: Not on file   Number of children: 1   Years of education: Not on file   Highest education level: Bachelor's degree (e.g., BA, AB, BS)  Occupational History   Not on file  Tobacco Use   Smoking status: Former    Current packs/day: 0.00    Average packs/day: 0.5 packs/day for 12.0 years (6.0 ttl pk-yrs)    Types: Cigarettes    Start date: 06/24/1999    Quit date: 06/24/2011    Years since quitting: 11.5   Smokeless tobacco: Never  Vaping Use   Vaping status: Never Used  Substance and Sexual Activity   Alcohol use: No    Comment: hx etoh abuse 7 years ago   Drug use: No   Sexual activity: Yes    Birth control/protection: None    Comment: approx [redacted] wks gestation  Other Topics Concern   Not on file  Social History Narrative   Lives at home with husband and daughter   Right handed   Caffeine: 200 mg or less per day   Social Determinants of Health   Financial Resource Strain: Low Risk  (06/12/2022)   Overall Financial Resource Strain (CARDIA)    Difficulty of Paying Living Expenses: Not hard at all  Food Insecurity: No Food Insecurity (06/12/2022)   Hunger Vital Sign    Worried About Running Out of Food in the Last Year: Never true    Ran Out of Food in the Last Year: Never true  Transportation Needs: No Transportation Needs (06/12/2022)   PRAPARE - Administrator, Civil Service (Medical): No    Lack of Transportation (Non-Medical): No  Physical Activity: Insufficiently Active (06/12/2022)   Exercise Vital Sign    Days of Exercise per Week: 4 days    Minutes of  Exercise per Session: 30 min  Stress: Stress Concern Present (06/12/2022)   [employer] of Occupational Health - Occupational Stress Questionnaire    Feeling of Stress : To some extent  Social Connections: Socially Integrated (06/12/2022)   Social Connection and Isolation Panel [NHANES]    Frequency of Communication with Friends and Family: More than three times a week    Frequency of Social Gatherings with Friends and Family: More than three times a week    Attends  Religious Services: 1 to 4 times per year    Active Member of Clubs or Organizations: Yes    Attends Banker Meetings: More than 4 times per year    Marital Status: Married    Her Allergies Are:  Allergies  Allergen Reactions   Nsaids Rash    Blisters also   Ibuprofen     Fixed drug reaction with bullous dermatitis  :   Her Current Medications Are:  Outpatient Encounter Medications as of 01/08/2023  Medication Sig   acetaminophen (TYLENOL) 500 MG tablet Take 500 mg by mouth every 6 (six) hours as needed for moderate pain.    ALPRAZolam (XANAX) 0.5 MG tablet Take 1 tablet (0.5 mg total) by mouth daily as needed for anxiety (for airplane travel).   amLODipine (NORVASC) 5 MG tablet TAKE 1 TABLET(5 MG) BY MOUTH DAILY   atorvastatin (LIPITOR) 40 MG tablet TAKE 1 TABLET(40 MG) BY MOUTH DAILY   cholecalciferol (VITAMIN D3) 25 MCG (1000 UT) tablet Take 1,000 Units by mouth daily.   metFORMIN (GLUCOPHAGE-XR) 750 MG 24 hr tablet Take 750 mg by mouth 2 (two) times daily.   omeprazole (PRILOSEC OTC) 20 MG tablet Take 20 mg by mouth daily.   pramipexole (MIRAPEX) 0.5 MG tablet TAKE 1 TABLET(0.5 MG) BY MOUTH AT BEDTIME AS NEEDED   Semaglutide, 1 MG/DOSE, 4 MG/3ML SOPN Inject 1 mg into the skin once a week.   sertraline (ZOLOFT) 100 MG tablet TAKE 1 TABLET(100 MG) BY MOUTH DAILY   valsartan (DIOVAN) 160 MG tablet Take 1 tablet (160 mg total) by mouth daily.   Vitamin D, Ergocalciferol, (DRISDOL) 1.25 MG (50000  UNIT) CAPS capsule Take 1 capsule (50,000 Units total) by mouth every 7 (seven) days for 12 doses.   No facility-administered encounter medications on file as of 01/08/2023.  :   Review of Systems:  Out of a complete 14 point review of systems, all are reviewed and negative with the exception of these symptoms as listed below:   Review of Systems  Neurological:        Patient is here alone for sleep consult. She states she has had restless legs for about 15 years and it has worsened in the last 6 years since she had her daughter. Her symptoms were "pretty ok" during pregnancy but then got worse and worse, now its awful. She flies a lot for work. She was on a flight this morning actually and even though she took a Mirapex she still had bad trouble with restless legs. It is painful for her. She walks, exercises, but nothing helps. She has even seen a vascular doctor. She states her blood work is all ok, her iron levels are usually normal. She states hydrochlorothiazide was making her Potassium fall but she has had restless legs for way longer than that. She wonders if the restless legs are a side effect of Zoloft but she states cannot go without it. Patient states her husband tells her that when she's asleep her body spasms, including shoulders. ESS 9.     Objective:  Neurological Exam  Physical Exam Physical Examination:   Vitals:   01/08/23 1421  BP: 124/73  Pulse: 90    General Examination: The patient is a very pleasant 42 y.o. female in no acute distress. She appears well-developed and well-nourished and well groomed.   HEENT: Normocephalic, atraumatic, pupils are equal, round and reactive to light, extraocular tracking is good without limitation to gaze excursion or nystagmus noted. Hearing  is grossly intact. Face is symmetric with normal facial animation. Speech is clear with no dysarthria noted. There is no hypophonia. There is no lip, neck/head, jaw or voice tremor. Neck is supple  with full range of passive and active motion. There are no carotid bruits on auscultation. Oropharynx exam reveals: Mild mouth dryness, good dental hygiene, mild airway crowding secondary to small airway, tonsillar size of about 1-2+ bilaterally, Mallampati class II, neck circumference 14-1/2 inches, minimal overbite.  Tongue protrudes centrally and palate elevates symmetrically.   Chest: Clear to auscultation without wheezing, rhonchi or crackles noted.  Heart: S1+S2+0, regular and normal without murmurs, rubs or gallops noted.   Abdomen: Soft, non-tender and non-distended.  Extremities: There is no obvious swelling in the distal lower extremities bilaterally.   Skin: Warm and dry without trophic changes noted.   Musculoskeletal: exam reveals no obvious joint deformities.   Neurologically:  Mental status: The patient is awake, alert and oriented in all 4 spheres.  immediate and remote memory, attention, language skills and fund of knowledge are appropriate. There is no evidence of aphasia, agnosia, apraxia or anomia. Speech is clear with normal prosody and enunciation. Thought process is linear. Mood is normal and affect is normal.  Cranial nerves II - XII are as described above under HEENT exam.  Motor exam: Normal bulk, strength and tone is noted. There is no obvious action or resting tremor.  Fine motor skills and coordination: grossly intact.  Cerebellar testing: No dysmetria or intention tremor. There is no truncal or gait ataxia.  Sensory exam: intact to light touch in the upper and lower extremities.  Gait, station and balance: She stands easily. No veering to one side is noted. No leaning to one side is noted. Posture is age-appropriate and stance is narrow based. Gait shows normal stride length and normal pace. No problems turning are noted.   Assessment and Plan:  In summary, Alexis Craig is a very pleasant 42 y.o.-year old female with an underlying medical history of  anxiety, reflux disease, with low vitamin D, migraine headaches, history of kidney stone, and borderline obesity, who presents for evaluation of her sleep disturbance, including difficulty initiating and maintaining sleep, particularly secondary to restless leg symptoms.  She has had a longstanding history of RLS and also leg twitching at night.  She may have exacerbation of her symptoms secondary to sertraline and low ferritin/iron storage.  I would like to recheck her iron levels today.  She has been on pramipexole and I cautioned her regarding taking too much of it as it can exacerbate/augment restless leg symptoms.  She is advised to limit herself to pramipexole 0.5 mg in the late afternoon and we can use as an adjunct gabapentin low-dose 100 mg strength at bedtime.  We will also proceed with a sleep study to further delineate her underlying sleep disturbance, if she has obstructive sleep apnea I may very well suggest a treatment trial with AutoPap therapy.  We will keep her posted as to her ferritin level, I may ask her to take an over-the-counter iron supplement to bring the level above 50.  She is currently on prescription strength vitamin D which may also help.  She is no longer on hydrochlorothiazide which depleted her potassium which could have contributed to her cramping at the time.   We will plan a follow-up after her sleep test.  She is advised to use gabapentin 100 mg at bedtime as an adjunct, she can use it as needed.  She is advised that we will start on the low-dose, we can always increase it if necessary.  We talked about expectations and possible common side effects of this medication.  I answered all her questions today and she was in agreement with our approach.   Thank you very much for allowing me to participate in the care of this nice patient. If I can be of any further assistance to you please do not hesitate to call me at [phone].  Sincerely,   Huston Foley, MD, PhD

## 2023-01-08 NOTE — Patient Instructions (Signed)
Please continue with Mirapex 0.5 mg each afternoon.   As an adjunct for your leg pain and restless legs: Start Neurontin (gabapentin) 100 mg strength: Take 1 pill nightly at bedtime for 1 week, then 2 pills nightly for 1 week, then 3 pills each night thereafter. The most common side effects reported are sedation or sleepiness. Rare side effects include balance problems, confusion.    Restless leg symptoms can be induced are aggravated by certain conditions including thyroid dysfunction and anemia or iron deficiency. Certain medications can induce leg twitching at night, which we call periodic leg movements and disrupt sleep and also exacerbate restless leg symptoms during wakefulness. These medications often include antidepressants.  We will do some blood work today (iron studies) and call you with the test results. I may ask you to start an over-the-counter iron supplement if your storage iron values are low.   We will proceed with a sleep study.

## 2023-01-09 LAB — IRON AND TIBC
Iron Saturation: 11 % — ABNORMAL LOW (ref 15–55)
Iron: 40 ug/dL (ref 27–159)
Total Iron Binding Capacity: 352 ug/dL (ref 250–450)
UIBC: 312 ug/dL (ref 131–425)

## 2023-01-09 LAB — FERRITIN: Ferritin: 27 ng/mL (ref 15–150)

## 2023-01-13 ENCOUNTER — Telehealth: Payer: Self-pay | Admitting: Neurology

## 2023-01-13 MED ORDER — GABAPENTIN 100 MG PO CAPS: ORAL_CAPSULE | ORAL | 3 refills | Status: DC

## 2023-01-13 NOTE — Telephone Encounter (Signed)
I returned the patient's call and relayed the following message:    She verbalized understanding of the findings and expressed appreciation for the call. She was agreeable to the plan.  She also stated her gabapentin was to be increased, per the office visit note on 01/08/2023 as follows: Start neurontin (gabapentin) 100 mg: take 1 pill nightly at bedtime for 1 week, then 2 pills nightly for 1 week, then 3 pills each night thereafter.  When the prescription was sent, it was sent as gabapentin 100 mg nightly. The pharmacy will not dispense more medication due to the way it is written. I will send the medication with updated instructions.

## 2023-01-13 NOTE — Telephone Encounter (Signed)
I spoke with the patient. She was hoping that the dose could be increased. She is aware the medication was started recently, however she feels like she could tolerate more. She is still having RLS symptoms, has not noticed a significant improvement.

## 2023-01-13 NOTE — Telephone Encounter (Signed)
Actually, the plan was to use gabapentin 100 mg at bedtime prn.  I do see the titration in the instruction but for now, the plan was to use 100 mg at night. If she wants to go up we can. Please inquire.

## 2023-01-13 NOTE — Telephone Encounter (Signed)
Pt called back, stating someone just call her to go over labs. Pt requesting a call back from nurse.

## 2023-01-13 NOTE — Telephone Encounter (Signed)
Message relayed to the patient. She will take medication according to updated instructions. She expressed understanding/appreciation for the call.

## 2023-01-13 NOTE — Telephone Encounter (Signed)
Thanks for double checking; Rx signed.

## 2023-01-19 DIAGNOSIS — M2550 Pain in unspecified joint: Secondary | ICD-10-CM | POA: Diagnosis not present

## 2023-01-19 DIAGNOSIS — Z6829 Body mass index (BMI) 29.0-29.9, adult: Secondary | ICD-10-CM | POA: Diagnosis not present

## 2023-01-28 ENCOUNTER — Telehealth: Payer: Self-pay | Admitting: Neurology

## 2023-01-28 DIAGNOSIS — G2581 Restless legs syndrome: Secondary | ICD-10-CM

## 2023-01-28 DIAGNOSIS — R519 Headache, unspecified: Secondary | ICD-10-CM

## 2023-01-28 DIAGNOSIS — G4761 Periodic limb movement disorder: Secondary | ICD-10-CM

## 2023-01-28 DIAGNOSIS — M79605 Pain in left leg: Secondary | ICD-10-CM

## 2023-01-28 DIAGNOSIS — R0683 Snoring: Secondary | ICD-10-CM

## 2023-01-28 DIAGNOSIS — E611 Iron deficiency: Secondary | ICD-10-CM

## 2023-01-28 DIAGNOSIS — E669 Obesity, unspecified: Secondary | ICD-10-CM

## 2023-01-28 DIAGNOSIS — G478 Other sleep disorders: Secondary | ICD-10-CM

## 2023-01-28 DIAGNOSIS — G479 Sleep disorder, unspecified: Secondary | ICD-10-CM

## 2023-01-28 NOTE — Addendum Note (Signed)
Addended by: Bertram Savin on: 01/28/2023 10:33 AM   Modules accepted: Orders

## 2023-01-28 NOTE — Telephone Encounter (Signed)
HST ordered.

## 2023-01-28 NOTE — Telephone Encounter (Signed)
[employer] denied the [employer] can you put a HST order in thank you.

## 2023-01-28 NOTE — Telephone Encounter (Signed)
Noted, thank you.  HST [employer] Berkley Harvey: [id number] (exp. 01/28/23 to 03/28/23)

## 2023-02-04 ENCOUNTER — Ambulatory Visit: Payer: BC Managed Care – PPO | Admitting: Neurology

## 2023-02-04 DIAGNOSIS — G479 Sleep disorder, unspecified: Secondary | ICD-10-CM

## 2023-02-04 DIAGNOSIS — E669 Obesity, unspecified: Secondary | ICD-10-CM

## 2023-02-04 DIAGNOSIS — R519 Headache, unspecified: Secondary | ICD-10-CM

## 2023-02-04 DIAGNOSIS — R0683 Snoring: Secondary | ICD-10-CM

## 2023-02-04 DIAGNOSIS — G2581 Restless legs syndrome: Secondary | ICD-10-CM

## 2023-02-04 DIAGNOSIS — G4733 Obstructive sleep apnea (adult) (pediatric): Secondary | ICD-10-CM | POA: Diagnosis not present

## 2023-02-04 DIAGNOSIS — G478 Other sleep disorders: Secondary | ICD-10-CM

## 2023-02-04 DIAGNOSIS — M79604 Pain in right leg: Secondary | ICD-10-CM

## 2023-02-04 DIAGNOSIS — G4761 Periodic limb movement disorder: Secondary | ICD-10-CM

## 2023-02-04 DIAGNOSIS — E611 Iron deficiency: Secondary | ICD-10-CM

## 2023-02-09 DIAGNOSIS — E782 Mixed hyperlipidemia: Secondary | ICD-10-CM | POA: Diagnosis not present

## 2023-02-09 DIAGNOSIS — Z6829 Body mass index (BMI) 29.0-29.9, adult: Secondary | ICD-10-CM | POA: Diagnosis not present

## 2023-02-11 ENCOUNTER — Telehealth: Payer: Self-pay

## 2023-02-11 NOTE — Telephone Encounter (Signed)
LVM for pt to use HST device no later than 02/15/23

## 2023-02-13 ENCOUNTER — Telehealth: Payer: BC Managed Care – PPO | Admitting: Nurse Practitioner

## 2023-02-13 DIAGNOSIS — U071 COVID-19: Secondary | ICD-10-CM

## 2023-02-13 MED ORDER — NIRMATRELVIR/RITONAVIR (PAXLOVID)TABLET: 3.0000 | ORAL_TABLET | Freq: Two times a day (BID) | ORAL | 0 refills | Status: AC

## 2023-02-13 MED ORDER — BENZONATATE 100 MG PO CAPS: 100.0000 mg | ORAL_CAPSULE | Freq: Two times a day (BID) | ORAL | 0 refills | Status: DC | PRN

## 2023-02-13 MED ORDER — METHOCARBAMOL 500 MG PO TABS: 500.0000 mg | ORAL_TABLET | Freq: Four times a day (QID) | ORAL | 0 refills | Status: AC | PRN

## 2023-02-13 NOTE — Progress Notes (Signed)
Virtual Visit Consent   Alexis Craig, you are scheduled for a virtual visit with a [facility] provider today. Just as with appointments in the office, your consent must be obtained to participate. Your consent will be active for this visit and any virtual visit you may have with one of our providers in the next 365 days. If you have a MyChart account, a copy of this consent can be sent to you electronically.  As this is a virtual visit, video technology does not allow for your provider to perform a traditional examination. This may limit your provider's ability to fully assess your condition. If your provider identifies any concerns that need to be evaluated in person or the need to arrange testing (such as labs, EKG, etc.), we will make arrangements to do so. Although advances in technology are sophisticated, we cannot ensure that it will always work on either your end or our end. If the connection with a video visit is poor, the visit may have to be switched to a telephone visit. With either a video or telephone visit, we are not always able to ensure that we have a secure connection.  By engaging in this virtual visit, you consent to the provision of healthcare and authorize for your insurance to be billed (if applicable) for the services provided during this visit. Depending on your insurance coverage, you may receive a charge related to this service.  I need to obtain your verbal consent now. Are you willing to proceed with your visit today? Alexis Craig has provided verbal consent on 02/13/2023 for a virtual visit (video or telephone). Viviano Simas, FNP  Date: 02/13/2023 12:02 PM  Virtual Visit via Video Note   I, Viviano Simas, connected with  Alexis Craig  ([id number], 1981-03-01) on 02/13/23 at 12:15 PM EDT by a video-enabled telemedicine application and verified that I am speaking with the correct person using two identifiers.  Location: Patient: Virtual Visit Location  Patient: Home Provider: Virtual Visit Location Provider: Home Office   I discussed the limitations of evaluation and management by telemedicine and the availability of in person appointments. The patient expressed understanding and agreed to proceed.    History of Present Illness: Alexis Craig is a 42 y.o. who identifies as a female who was assigned female at birth, and is being seen today after testing positive for COVID-19  Symptom onset was yesterday  Symptoms include: Cough, sore throat, no fever, does have a headache and body aches     She has had COVID in the past most recently one year ago  Denies a complicated course, she did use an anti-viral at that time   She has been vaccinated without recent booster   GFR from June 82  Denies a history of asthma   She does have a history of HTN   Problems:  Patient Active Problem List   Diagnosis Date Noted   Vitamin D deficiency 12/03/2022   Hypokalemia 12/03/2022   Hypertension 02/27/2021   Hyperlipidemia 02/27/2021   Changing skin lesion 01/13/2020   Gestational diabetes mellitus (GDM) affecting pregnancy 03/11/2016   ANAL FISSURE 04/09/2009   RECTAL BLEEDING 04/09/2009   RESTLESS LEG SYNDROME 01/22/2009   INTENTION TREMOR 06/13/2008   Anxiety state 03/09/2008   INSOMNIA 10/05/2007   HEADACHE 08/23/2007   NEPHROLITHIASIS, HX OF 08/23/2007    Allergies:  Allergies  Allergen Reactions   Nsaids Rash    Blisters also   Ibuprofen     Fixed  drug reaction with bullous dermatitis   Medications:  Current Outpatient Medications:    acetaminophen (TYLENOL) 500 MG tablet, Take 500 mg by mouth every 6 (six) hours as needed for moderate pain. , Disp: , Rfl:    ALPRAZolam (XANAX) 0.5 MG tablet, Take 1 tablet (0.5 mg total) by mouth daily as needed for anxiety (for airplane travel)., Disp: 20 tablet, Rfl: 0   amLODipine (NORVASC) 5 MG tablet, TAKE 1 TABLET(5 MG) BY MOUTH DAILY, Disp: 90 tablet, Rfl: 0   atorvastatin  (LIPITOR) 40 MG tablet, TAKE 1 TABLET(40 MG) BY MOUTH DAILY, Disp: 90 tablet, Rfl: 0   cholecalciferol (VITAMIN D3) 25 MCG (1000 UT) tablet, Take 1,000 Units by mouth daily., Disp: , Rfl:    gabapentin (NEURONTIN) 100 MG capsule, Take 1 pill nightly at bedtime for 1 week, then 2 pills nightly for 1 week, then 3 pills each night thereafter., Disp: 90 capsule, Rfl: 3   metFORMIN (GLUCOPHAGE-XR) 750 MG 24 hr tablet, Take 750 mg by mouth 2 (two) times daily., Disp: , Rfl:    omeprazole (PRILOSEC OTC) 20 MG tablet, Take 20 mg by mouth daily., Disp: , Rfl:    pramipexole (MIRAPEX) 0.5 MG tablet, TAKE 1 TABLET(0.5 MG) BY MOUTH AT BEDTIME AS NEEDED, Disp: 90 tablet, Rfl: 0   Semaglutide, 1 MG/DOSE, 4 MG/3ML SOPN, Inject 1 mg into the skin once a week., Disp: , Rfl:    sertraline (ZOLOFT) 100 MG tablet, TAKE 1 TABLET(100 MG) BY MOUTH DAILY, Disp: 90 tablet, Rfl: 0   valsartan (DIOVAN) 160 MG tablet, Take 1 tablet (160 mg total) by mouth daily., Disp: 90 tablet, Rfl: 1   Vitamin D, Ergocalciferol, (DRISDOL) 1.25 MG (50000 UNIT) CAPS capsule, Take 1 capsule (50,000 Units total) by mouth every 7 (seven) days for 12 doses., Disp: 12 capsule, Rfl: 0  Observations/Objective: Patient is well-developed, well-nourished in no acute distress.  Resting comfortably  at home.  Head is normocephalic, atraumatic.  No labored breathing.  Speech is clear and coherent with logical content.  Patient is alert and oriented at baseline.    Assessment and Plan:  1. COVID-19  Hold atorvastatin while on anti-viral Take anti-viral with food   Assure hydration and protein in claries each day  - nirmatrelvir/ritonavir (PAXLOVID) 20 x 150 MG & 10 x 100MG  TABS; Take 3 tablets by mouth 2 (two) times daily for 5 days. (Take nirmatrelvir 150 mg two tablets twice daily for 5 days and ritonavir 100 mg one tablet twice daily for 5 days) Patient GFR is 82  Dispense: 30 tablet; Refill: 0 - benzonatate (TESSALON) 100 MG capsule; Take  1 capsule (100 mg total) by mouth 2 (two) times daily as needed for cough.  Dispense: 20 capsule; Refill: 0 - methocarbamol (ROBAXIN) 500 MG tablet; Take 1 tablet (500 mg total) by mouth every 6 (six) hours as needed for up to 5 days for muscle spasms.  Dispense: 15 tablet; Refill: 0     Follow Up Instructions: I discussed the assessment and treatment plan with the patient. The patient was provided an opportunity to ask questions and all were answered. The patient agreed with the plan and demonstrated an understanding of the instructions.  A copy of instructions were sent to the patient via MyChart unless otherwise noted below.    The patient was advised to call back or seek an in-person evaluation if the symptoms worsen or if the condition fails to improve as anticipated.  Time:  I spent 11 minutes  with the patient via telehealth technology discussing the above problems/concerns.    Viviano Simas, FNP

## 2023-02-13 NOTE — Patient Instructions (Signed)
Hold atorvastatin while on Paxlovid  May restart the day following the last dose of anti-viral (Paxlovid)

## 2023-02-14 ENCOUNTER — Other Ambulatory Visit: Payer: Self-pay | Admitting: Internal Medicine

## 2023-02-14 DIAGNOSIS — I10 Essential (primary) hypertension: Secondary | ICD-10-CM

## 2023-02-14 DIAGNOSIS — F411 Generalized anxiety disorder: Secondary | ICD-10-CM

## 2023-02-14 DIAGNOSIS — E782 Mixed hyperlipidemia: Secondary | ICD-10-CM

## 2023-02-16 DIAGNOSIS — L570 Actinic keratosis: Secondary | ICD-10-CM | POA: Diagnosis not present

## 2023-02-19 NOTE — Progress Notes (Signed)
See procedure note.

## 2023-02-20 NOTE — Procedures (Signed)
     [facility]  HOME SLEEP TEST (Watch PAT) REPORT -  Mail-out Device  STUDY DATE: 02/11/2023  DOB: June 07, 1981  MRN: [id number]  ORDERING CLINICIAN: Huston Foley, MD, PhD   REFERRING CLINICIAN: Philip Aspen, Limmie Patricia, MD   CLINICAL INFORMATION/HISTORY: 42 year old female with an underlying medical history of anxiety, reflux disease, with low vitamin D, migraine headaches, history of kidney stone, and borderline obesity, who has a longstanding history of restless leg symptoms of over 15 years, she has a longer standing history dating back to her childhood of difficulty initiating and maintaining sleep, nonrestorative sleep.   Epworth sleepiness score: 9/24.  BMI: 30.9 kg/m  FINDINGS:   Sleep Summary:   Total Recording Time (hours, min): 9 hours, 9 min  Total Sleep Time (hours, min):  8 hours, 37 min  Percent REM (%):    25.3%   Respiratory Indices:   Calculated pAHI (per hour):  5.2/hour         REM pAHI:    10.6/hour       NREM pAHI: 3.3/hour  Central pAHI: 0.2/hour  Oxygen Saturation Statistics:    Oxygen Saturation (%) Mean: 95%   Minimum oxygen saturation (%):                 91%   O2 Saturation Range (%): 91- 99%    O2 Saturation (minutes) <=88%: 0 min  Pulse Rate Statistics:   Pulse Mean (bpm):    78/min    Pulse Range (62 - 114/min)   IMPRESSION: OSA (obstructive sleep apnea), borderline  RECOMMENDATION:  This HST shows borderline obstructive sleep apnea, with an AHI of 5.2/hour and O2 nadir of 91%. Treatment with positive airway pressure can be considered with autoPAP, if desired by the patient. Treatment options otherwise include weight loss and avoidance of the supine sleep position or a dental device through a dentist or orthodontist.  Snoring was intermittent on the mild to at times moderate range. These different avenues will be discussed with the patient. The patient will be seen in follow up in sleep clinic, as  scheduled.  Please note, that other causes of the patient's symptoms, including circadian rhythm disturbances, an underlying mood disorder, medication effect and/or an underlying medical problem cannot be ruled out based on this test. Clinical correlation is recommended. The patient should be cautioned not to drive, work at heights, or operate dangerous or heavy equipment when tired or sleepy. Review and reiteration of good sleep hygiene measures should be pursued with any patient. The referring provider will be notified of the test results.   I certify that I have reviewed the raw data recording prior to the issuance of this report in accordance with the standards of the American Academy of Sleep Medicine (AASM).  Huston Foley, MD, PhD - Medical Director [facility]) Diplomat, American Board of Psychiatry and Neurology Diplomat, American Board of Sleep Medicine    INTERPRETING PHYSICIAN:   Huston Foley, MD, PhD Medical Director, [facility]) Diplomat, ABPN (Neurology and Sleep)   [facility] [address] [place], [place] [address] [phone]

## 2023-03-03 ENCOUNTER — Other Ambulatory Visit: Payer: Self-pay | Admitting: Internal Medicine

## 2023-03-03 DIAGNOSIS — G2581 Restless legs syndrome: Secondary | ICD-10-CM

## 2023-03-03 DIAGNOSIS — E782 Mixed hyperlipidemia: Secondary | ICD-10-CM | POA: Diagnosis not present

## 2023-03-03 DIAGNOSIS — Z6829 Body mass index (BMI) 29.0-29.9, adult: Secondary | ICD-10-CM | POA: Diagnosis not present

## 2023-03-07 ENCOUNTER — Other Ambulatory Visit: Payer: Self-pay | Admitting: Internal Medicine

## 2023-03-07 DIAGNOSIS — E559 Vitamin D deficiency, unspecified: Secondary | ICD-10-CM

## 2023-03-09 ENCOUNTER — Encounter: Payer: Self-pay | Admitting: Neurology

## 2023-03-16 ENCOUNTER — Encounter: Payer: Self-pay | Admitting: Internal Medicine

## 2023-03-16 ENCOUNTER — Ambulatory Visit: Payer: BC Managed Care – PPO | Admitting: Internal Medicine

## 2023-03-16 VITALS — BP 120/80 | HR 80 | Temp 98.2°F | Wt 170.3 lb

## 2023-03-16 DIAGNOSIS — E559 Vitamin D deficiency, unspecified: Secondary | ICD-10-CM

## 2023-03-16 DIAGNOSIS — I10 Essential (primary) hypertension: Secondary | ICD-10-CM | POA: Diagnosis not present

## 2023-03-16 DIAGNOSIS — R5383 Other fatigue: Secondary | ICD-10-CM | POA: Diagnosis not present

## 2023-03-16 DIAGNOSIS — E876 Hypokalemia: Secondary | ICD-10-CM

## 2023-03-16 DIAGNOSIS — Z6829 Body mass index (BMI) 29.0-29.9, adult: Secondary | ICD-10-CM | POA: Diagnosis not present

## 2023-03-16 DIAGNOSIS — Z23 Encounter for immunization: Secondary | ICD-10-CM

## 2023-03-16 DIAGNOSIS — G479 Sleep disorder, unspecified: Secondary | ICD-10-CM | POA: Diagnosis not present

## 2023-03-16 DIAGNOSIS — E782 Mixed hyperlipidemia: Secondary | ICD-10-CM | POA: Diagnosis not present

## 2023-03-16 DIAGNOSIS — F419 Anxiety disorder, unspecified: Secondary | ICD-10-CM | POA: Diagnosis not present

## 2023-03-16 LAB — IBC + FERRITIN
Ferritin: 15.8 ng/mL (ref 10.0–291.0)
Iron: 63 ug/dL (ref 42–145)
Saturation Ratios: 16.5 % — ABNORMAL LOW (ref 20.0–50.0)
TIBC: 380.8 ug/dL (ref 250.0–450.0)
Transferrin: 272 mg/dL (ref 212.0–360.0)

## 2023-03-16 LAB — CBC WITH DIFFERENTIAL/PLATELET
Basophils Absolute: 0 10*3/uL (ref 0.0–0.1)
Basophils Relative: 0.4 % (ref 0.0–3.0)
Eosinophils Absolute: 0.1 10*3/uL (ref 0.0–0.7)
Eosinophils Relative: 0.9 % (ref 0.0–5.0)
HCT: 40.1 % (ref 36.0–46.0)
Hemoglobin: 13 g/dL (ref 12.0–15.0)
Lymphocytes Relative: 28.9 % (ref 12.0–46.0)
Lymphs Abs: 2.6 10*3/uL (ref 0.7–4.0)
MCHC: 32.3 g/dL (ref 30.0–36.0)
MCV: 85.4 fl (ref 78.0–100.0)
Monocytes Absolute: 0.7 10*3/uL (ref 0.1–1.0)
Monocytes Relative: 8.1 % (ref 3.0–12.0)
Neutro Abs: 5.5 10*3/uL (ref 1.4–7.7)
Neutrophils Relative %: 61.7 % (ref 43.0–77.0)
Platelets: 389 10*3/uL (ref 150.0–400.0)
RBC: 4.7 Mil/uL (ref 3.87–5.11)
RDW: 14.6 % (ref 11.5–15.5)
WBC: 8.9 10*3/uL (ref 4.0–10.5)

## 2023-03-16 LAB — COMPREHENSIVE METABOLIC PANEL
ALT: 13 U/L (ref 0–35)
AST: 14 U/L (ref 0–37)
Albumin: 4.1 g/dL (ref 3.5–5.2)
Alkaline Phosphatase: 55 U/L (ref 39–117)
BUN: 13 mg/dL (ref 6–23)
CO2: 24 meq/L (ref 19–32)
Calcium: 9.2 mg/dL (ref 8.4–10.5)
Chloride: 102 meq/L (ref 96–112)
Creatinine, Ser: 0.74 mg/dL (ref 0.40–1.20)
GFR: 98.6 mL/min (ref >60.00)
Glucose, Bld: 73 mg/dL (ref 70–99)
Potassium: 3.6 mEq/L (ref 3.5–5.1)
Sodium: 136 meq/L (ref 135–145)
Total Bilirubin: 0.5 mg/dL (ref 0.2–1.2)
Total Protein: 6.9 g/dL (ref 6.0–8.3)

## 2023-03-16 LAB — LIPID PANEL
Cholesterol: 142 mg/dL (ref 0–200)
HDL: 51.5 mg/dL (ref >39.00)
LDL Cholesterol: 65 mg/dL (ref 0–99)
NonHDL: 90.53
Total CHOL/HDL Ratio: 3
Triglycerides: 126 mg/dL (ref 0.0–149.0)
VLDL: 25.2 mg/dL (ref 0.0–40.0)

## 2023-03-16 LAB — VITAMIN D 25 HYDROXY (VIT D DEFICIENCY, FRACTURES): VITD: 41.13 ng/mL (ref 30.00–100.00)

## 2023-03-16 LAB — TSH: TSH: 1.7 u[IU]/mL (ref 0.35–5.50)

## 2023-03-16 NOTE — Addendum Note (Signed)
Addended by: Kern Reap B on: 03/16/2023 10:45 AM   Modules accepted: Orders

## 2023-03-16 NOTE — Progress Notes (Signed)
Established Patient Office Visit     CC/Reason for Visit: Follow-up lab abnormalities  HPI: Alexis Craig is a 42 y.o. female who is coming in today for the above mentioned reasons.  During her last labs she was found to have vitamin D deficiency, hypokalemia, hypercalcemia, significant hyperlipidemia.  She is due today to have these levels rechecked.  She has been feeling fatigued.  She did have a COVID infection 3 to 4 weeks ago.  She wonders if it could be related to this.  Past Medical/Surgical History: Past Medical History:  Diagnosis Date   Anal fissure 04/09/2009   ANXIETY 03/09/2008   Depression    GERD (gastroesophageal reflux disease)    Gestational diabetes    metformin   High cholesterol    History of alcohol abuse    7 years ago   History of low potassium    Hx of varicella    Hypertension    INSOMNIA 10/05/2007   INTENTION TREMOR 06/13/2008   Low vitamin D level    Migraines    NEPHROLITHIASIS, HX OF 08/23/2007   passed stone, no surgery   RECTAL BLEEDING 04/09/2009   RESTLESS LEG SYNDROME 01/22/2009    Past Surgical History:  Procedure Laterality Date   DILATION AND EVACUATION N/A 03/31/2019   Procedure: DILATATION AND EVACUATION with genetic studies;  Surgeon: Candice Camp, MD;  Location: [facility] OR;  Service: Gynecology;  Laterality: N/A;  with genetic studies   EXCISIONAL HEMORRHOIDECTOMY     KIDNEY STONE SURGERY Right 07/2020   KIDNEY STONE SURGERY Left 08/2020   WISDOM TOOTH EXTRACTION      Social History:  reports that she quit smoking about 11 years ago. Her smoking use included cigarettes. She started smoking about 23 years ago. She has a 6 pack-year smoking history. She has never used smokeless tobacco. She reports that she does not drink alcohol and does not use drugs.  Allergies: Allergies  Allergen Reactions   Nsaids Rash    Blisters also   Ibuprofen     Fixed drug reaction with bullous dermatitis    Family History:  Family  History  Problem Relation Age of Onset   Diabetes Mother    Thyroid disease Mother    Cancer Mother    Heart disease Mother    Hyperlipidemia Father    Diabetes Father    Cancer Maternal Grandmother        colon   Heart disease Paternal Grandfather    Heart disease Maternal Uncle    Restless legs syndrome Neg Hx      Current Outpatient Medications:    acetaminophen (TYLENOL) 500 MG tablet, Take 500 mg by mouth every 6 (six) hours as needed for moderate pain. , Disp: , Rfl:    ALPRAZolam (XANAX) 0.5 MG tablet, Take 1 tablet (0.5 mg total) by mouth daily as needed for anxiety (for airplane travel)., Disp: 20 tablet, Rfl: 0   amLODipine (NORVASC) 5 MG tablet, TAKE 1 TABLET(5 MG) BY MOUTH DAILY, Disp: 90 tablet, Rfl: 1   atorvastatin (LIPITOR) 40 MG tablet, TAKE 1 TABLET(40 MG) BY MOUTH DAILY, Disp: 90 tablet, Rfl: 1   cholecalciferol (VITAMIN D3) 25 MCG (1000 UT) tablet, Take 1,000 Units by mouth daily., Disp: , Rfl:    gabapentin (NEURONTIN) 100 MG capsule, Take 1 pill nightly at bedtime for 1 week, then 2 pills nightly for 1 week, then 3 pills each night thereafter., Disp: 90 capsule, Rfl: 3   metFORMIN (GLUCOPHAGE-XR)  750 MG 24 hr tablet, Take 750 mg by mouth 2 (two) times daily., Disp: , Rfl:    omeprazole (PRILOSEC OTC) 20 MG tablet, Take 20 mg by mouth daily., Disp: , Rfl:    pramipexole (MIRAPEX) 0.5 MG tablet, TAKE 1 TABLET(0.5 MG) BY MOUTH AT BEDTIME AS NEEDED, Disp: 90 tablet, Rfl: 0   Semaglutide, 1 MG/DOSE, 4 MG/3ML SOPN, Inject 1 mg into the skin once a week., Disp: , Rfl:    sertraline (ZOLOFT) 100 MG tablet, TAKE 1 TABLET(100 MG) BY MOUTH DAILY, Disp: 90 tablet, Rfl: 1   valsartan (DIOVAN) 160 MG tablet, Take 1 tablet (160 mg total) by mouth daily., Disp: 90 tablet, Rfl: 1  Review of Systems:  Negative unless indicated in HPI.   Physical Exam: Vitals:   03/16/23 1009  BP: 120/80  Pulse: 80  Temp: 98.2 F (36.8 C)  TempSrc: Oral  SpO2: 100%  Weight: 170 lb 4.8 oz  (77.2 kg)    Body mass index is 29.23 kg/m.   Physical Exam Vitals reviewed.  Constitutional:      Appearance: Normal appearance.  HENT:     Head: Normocephalic and atraumatic.  Eyes:     Conjunctiva/sclera: Conjunctivae normal.     Pupils: Pupils are equal, round, and reactive to light.  Cardiovascular:     Rate and Rhythm: Normal rate and regular rhythm.  Pulmonary:     Effort: Pulmonary effort is normal.     Breath sounds: Normal breath sounds.  Skin:    General: Skin is warm and dry.  Neurological:     General: No focal deficit present.     Mental Status: She is alert and oriented to person, place, and time.  Psychiatric:        Mood and Affect: Mood normal.        Behavior: Behavior normal.        Thought Content: Thought content normal.        Judgment: Judgment normal.      Impression and Plan:  Vitamin D deficiency -     VITAMIN D 25 Hydroxy (Vit-D Deficiency, Fractures); Future  Primary hypertension  Mixed hyperlipidemia -     Lipid panel; Future  Hypokalemia -     Comprehensive metabolic panel; Future  Hypercalcemia  Other fatigue -     CBC with Differential/Platelet; Future -     TSH; Future -     IBC + Ferritin; Future  -Check labs to follow-up of deficiencies. -Add TSH and iron levels due to fatigue.   Time spent:30 minutes reviewing chart, interviewing and examining patient and formulating plan of care.     Chaya Jan, MD [facility]

## 2023-04-10 ENCOUNTER — Ambulatory Visit: Payer: BC Managed Care – PPO

## 2023-04-10 DIAGNOSIS — Z23 Encounter for immunization: Secondary | ICD-10-CM | POA: Diagnosis not present

## 2023-04-14 DIAGNOSIS — Z6829 Body mass index (BMI) 29.0-29.9, adult: Secondary | ICD-10-CM | POA: Diagnosis not present

## 2023-04-14 DIAGNOSIS — R454 Irritability and anger: Secondary | ICD-10-CM | POA: Diagnosis not present

## 2023-04-14 DIAGNOSIS — E782 Mixed hyperlipidemia: Secondary | ICD-10-CM | POA: Diagnosis not present

## 2023-04-14 DIAGNOSIS — G479 Sleep disorder, unspecified: Secondary | ICD-10-CM | POA: Diagnosis not present

## 2023-04-20 DIAGNOSIS — L03314 Cellulitis of groin: Secondary | ICD-10-CM | POA: Diagnosis not present

## 2023-04-23 DIAGNOSIS — L0291 Cutaneous abscess, unspecified: Secondary | ICD-10-CM | POA: Diagnosis not present

## 2023-04-27 DIAGNOSIS — E782 Mixed hyperlipidemia: Secondary | ICD-10-CM | POA: Diagnosis not present

## 2023-04-27 DIAGNOSIS — Z6829 Body mass index (BMI) 29.0-29.9, adult: Secondary | ICD-10-CM | POA: Diagnosis not present

## 2023-05-29 ENCOUNTER — Other Ambulatory Visit: Payer: Self-pay | Admitting: Internal Medicine

## 2023-05-29 DIAGNOSIS — I10 Essential (primary) hypertension: Secondary | ICD-10-CM

## 2023-05-29 DIAGNOSIS — G2581 Restless legs syndrome: Secondary | ICD-10-CM

## 2023-06-01 ENCOUNTER — Other Ambulatory Visit: Payer: Self-pay | Admitting: *Deleted

## 2023-06-01 DIAGNOSIS — E782 Mixed hyperlipidemia: Secondary | ICD-10-CM | POA: Diagnosis not present

## 2023-06-01 DIAGNOSIS — F419 Anxiety disorder, unspecified: Secondary | ICD-10-CM | POA: Diagnosis not present

## 2023-06-01 DIAGNOSIS — Z6829 Body mass index (BMI) 29.0-29.9, adult: Secondary | ICD-10-CM | POA: Diagnosis not present

## 2023-06-01 MED ORDER — GABAPENTIN 100 MG PO CAPS: 300.0000 mg | ORAL_CAPSULE | Freq: Every day | ORAL | 3 refills | Status: DC

## 2023-06-21 DIAGNOSIS — R059 Cough, unspecified: Secondary | ICD-10-CM | POA: Diagnosis not present

## 2023-06-21 DIAGNOSIS — J22 Unspecified acute lower respiratory infection: Secondary | ICD-10-CM | POA: Diagnosis not present

## 2023-08-22 ENCOUNTER — Other Ambulatory Visit: Payer: Self-pay | Admitting: Internal Medicine

## 2023-08-22 DIAGNOSIS — E782 Mixed hyperlipidemia: Secondary | ICD-10-CM

## 2023-08-27 ENCOUNTER — Other Ambulatory Visit: Payer: Self-pay | Admitting: Internal Medicine

## 2023-08-27 DIAGNOSIS — I10 Essential (primary) hypertension: Secondary | ICD-10-CM

## 2023-08-27 DIAGNOSIS — F411 Generalized anxiety disorder: Secondary | ICD-10-CM

## 2023-08-29 ENCOUNTER — Other Ambulatory Visit: Payer: Self-pay | Admitting: Internal Medicine

## 2023-08-29 DIAGNOSIS — G2581 Restless legs syndrome: Secondary | ICD-10-CM

## 2023-09-07 DIAGNOSIS — Z1231 Encounter for screening mammogram for malignant neoplasm of breast: Secondary | ICD-10-CM | POA: Diagnosis not present

## 2023-09-07 DIAGNOSIS — Z01419 Encounter for gynecological examination (general) (routine) without abnormal findings: Secondary | ICD-10-CM | POA: Diagnosis not present

## 2023-09-07 DIAGNOSIS — R319 Hematuria, unspecified: Secondary | ICD-10-CM | POA: Diagnosis not present

## 2023-09-07 DIAGNOSIS — Z6831 Body mass index (BMI) 31.0-31.9, adult: Secondary | ICD-10-CM | POA: Diagnosis not present

## 2023-09-07 DIAGNOSIS — Z124 Encounter for screening for malignant neoplasm of cervix: Secondary | ICD-10-CM | POA: Diagnosis not present

## 2023-09-07 LAB — HM MAMMOGRAPHY

## 2023-09-07 LAB — HM PAP SMEAR: HM Pap smear: NORMAL

## 2023-10-06 ENCOUNTER — Other Ambulatory Visit: Payer: Self-pay | Admitting: Neurology

## 2023-11-22 ENCOUNTER — Ambulatory Visit
Admission: RE | Admit: 2023-11-22 | Discharge: 2023-11-22 | Disposition: A | Discharge status: Home / Self Care | Source: Ambulatory Visit | Attending: Nurse Practitioner | Admitting: Nurse Practitioner

## 2023-11-22 VITALS — BP 146/84 | HR 93 | Temp 97.6°F | Resp 18

## 2023-11-22 DIAGNOSIS — Z872 Personal history of diseases of the skin and subcutaneous tissue: Secondary | ICD-10-CM | POA: Diagnosis not present

## 2023-11-22 DIAGNOSIS — L0291 Cutaneous abscess, unspecified: Secondary | ICD-10-CM | POA: Diagnosis not present

## 2023-11-22 MED ORDER — DOXYCYCLINE HYCLATE 100 MG PO TABS: 100.0000 mg | ORAL_TABLET | Freq: Two times a day (BID) | ORAL | 0 refills | Status: AC

## 2023-11-22 NOTE — Discharge Instructions (Signed)
 Take medication as prescribed. Warm compresses to the affected area 3-4 times daily. Clean the area at least twice daily with an antibacterial soap (Dial- gold bar). Keep the area covered while it is draining. Follow-up if you experience worsening pain, increased swelling, or the foul smelling drainage from the site. Go to the emergency department if you develop fever, chills, generalized fatigue, nausea, vomiting, or if the area of redness spreads into the genital region. Follow-up as needed.

## 2023-11-22 NOTE — ED Provider Notes (Signed)
 [facility]    CSN: [id number] Arrival date & time: 11/22/23  0947      History   Chief Complaint Chief Complaint  Patient presents with   Abscess    Hidradenitis suppurativa Left groin area, severe pain and nausea - Entered by patient    HPI Alexis Craig is a 43 y.o. female.   The history is provided by the patient.   Patient presents for complaints of a skin abscess to the left groin region.  Patient states symptoms have been present for the past several months, but over the past several days, she has developed pain, swelling, and drainage.  Patient reports she has a history of hidradenitis.  States that she has not had a flare or problem with this condition for quite some time.  States she has been using warm compresses to the affected area with minimal relief of symptoms.  Denies fever, chills, chest pain, abdominal pain, nausea, vomiting, diarrhea, rash, or purulent drainage from the site.  Past Medical History:  Diagnosis Date   Anal fissure 04/09/2009   ANXIETY 03/09/2008   Depression    GERD (gastroesophageal reflux disease)    Gestational diabetes    metformin   High cholesterol    History of alcohol abuse    7 years ago   History of low potassium    Hx of varicella    Hypertension    INSOMNIA 10/05/2007   INTENTION TREMOR 06/13/2008   Low vitamin D  level    Migraines    NEPHROLITHIASIS, HX OF 08/23/2007   passed stone, no surgery   RECTAL BLEEDING 04/09/2009   RESTLESS LEG SYNDROME 01/22/2009    Patient Active Problem List   Diagnosis Date Noted   Vitamin D  deficiency 12/03/2022   Hypokalemia 12/03/2022   Hypertension 02/27/2021   Hyperlipidemia 02/27/2021   Changing skin lesion 01/13/2020   Gestational diabetes mellitus (GDM) affecting pregnancy 03/11/2016   ANAL FISSURE 04/09/2009   RECTAL BLEEDING 04/09/2009   RESTLESS LEG SYNDROME 01/22/2009   INTENTION TREMOR 06/13/2008   Anxiety state 03/09/2008   INSOMNIA 10/05/2007    Headache 08/23/2007   NEPHROLITHIASIS, HX OF 08/23/2007    Past Surgical History:  Procedure Laterality Date   DILATION AND EVACUATION N/A 03/31/2019   Procedure: DILATATION AND EVACUATION with genetic studies;  Surgeon: Belle Box, MD;  Location: [facility] OR;  Service: Gynecology;  Laterality: N/A;  with genetic studies   EXCISIONAL HEMORRHOIDECTOMY     KIDNEY STONE SURGERY Right 07/2020   KIDNEY STONE SURGERY Left 08/2020   WISDOM TOOTH EXTRACTION      OB History     Gravida  4   Para  1   Term  1   Preterm      AB  2   Living  1      SAB  2   IAB      Ectopic      Multiple  0   Live Births  1            Home Medications    Prior to Admission medications   Medication Sig Start Date End Date Taking? Authorizing Provider  acetaminophen  (TYLENOL ) 500 MG tablet Take 500 mg by mouth every 6 (six) hours as needed for moderate pain.     [provider]  ALPRAZolam  (XANAX ) 0.5 MG tablet Take 1 tablet (0.5 mg total) by mouth daily as needed for anxiety (for airplane travel). 06/13/21   Zilphia Hilt, Charyl Coppersmith, MD  amLODipine  (NORVASC ) 5 MG tablet TAKE 1 TABLET(5 MG) BY MOUTH DAILY 08/27/23   Zilphia Hilt, Charyl Coppersmith, MD  atorvastatin  (LIPITOR) 40 MG tablet TAKE 1 TABLET(40 MG) BY MOUTH DAILY 08/24/23   Zilphia Hilt, Charyl Coppersmith, MD  cholecalciferol (VITAMIN D3) 25 MCG (1000 UT) tablet Take 1,000 Units by mouth daily.    [provider]  gabapentin  (NEURONTIN ) 100 MG capsule TAKE 3 CAPSULES(300 MG) BY MOUTH AT BEDTIME 10/07/23   Athar, Saima, MD  omeprazole (PRILOSEC OTC) 20 MG tablet Take 20 mg by mouth daily.    [provider]  pramipexole  (MIRAPEX ) 0.5 MG tablet TAKE 1 TABLET(0.5 MG) BY MOUTH AT BEDTIME AS NEEDED 09/01/23   Zilphia Hilt, Charyl Coppersmith, MD  sertraline  (ZOLOFT ) 100 MG tablet TAKE 1 TABLET(100 MG) BY MOUTH DAILY 08/27/23   Zilphia Hilt, Charyl Coppersmith, MD  valsartan  (DIOVAN ) 160 MG tablet TAKE 1 TABLET(160 MG) BY MOUTH DAILY  06/01/23   Zilphia Hilt, Charyl Coppersmith, MD    Family History Family History  Problem Relation Age of Onset   Diabetes Mother    Thyroid  disease Mother    Cancer Mother    Heart disease Mother    Hyperlipidemia Father    Diabetes Father    Cancer Maternal Grandmother        colon   Heart disease Paternal Grandfather    Heart disease Maternal Uncle    Restless legs syndrome Neg Hx     Social History Social History   Tobacco Use   Smoking status: Former    Current packs/day: 0.00    Average packs/day: 0.5 packs/day for 12.0 years (6.0 ttl pk-yrs)    Types: Cigarettes    Start date: 06/24/1999    Quit date: 06/24/2011    Years since quitting: 12.4   Smokeless tobacco: Never  Vaping Use   Vaping status: Never Used  Substance Use Topics   Alcohol use: No    Comment: hx etoh abuse 7 years ago   Drug use: No     Allergies   Nsaids and Ibuprofen   Review of Systems Review of Systems Per HPI  Physical Exam Triage Vital Signs ED Triage Vitals  Encounter Vitals Group     BP 11/22/23 0957 (!) 146/84     Systolic BP Percentile --      Diastolic BP Percentile --      Pulse Rate 11/22/23 0957 93     Resp 11/22/23 0957 18     Temp 11/22/23 0957 97.6 F (36.4 C)     Temp Source 11/22/23 0957 Oral     SpO2 11/22/23 0957 99 %     Weight --      Height --      Head Circumference --      Peak Flow --      Pain Score 11/22/23 0958 8     Pain Loc --      Pain Education --      Exclude from Growth Chart --    No data found.  Updated Vital Signs BP (!) 146/84 (BP Location: Right Arm)   Pulse 93   Temp 97.6 F (36.4 C) (Oral)   Resp 18   LMP 11/01/2023 (Approximate)   SpO2 99%   Visual Acuity Right Eye Distance:   Left Eye Distance:   Bilateral Distance:    Right Eye Near:   Left Eye Near:    Bilateral Near:     Physical Exam Vitals and nursing note reviewed.  Constitutional:      General: She is not in acute distress.    Appearance: Normal appearance.   HENT:     Head: Normocephalic.  Eyes:     Extraocular Movements: Extraocular movements intact.     Pupils: Pupils are equal, round, and reactive to light.  Pulmonary:     Effort: Pulmonary effort is normal.  Genitourinary:   Musculoskeletal:     Cervical back: Normal range of motion.  Skin:    General: Skin is warm and dry.     Findings: Abscess present.  Neurological:     General: No focal deficit present.     Mental Status: She is alert and oriented to person, place, and time.  Psychiatric:        Mood and Affect: Mood normal.        Behavior: Behavior normal.      UC Treatments / Results  Labs (all labs ordered are listed, but only abnormal results are displayed) Labs Reviewed - No data to display  EKG   Radiology No results found.  Procedures Procedures (including critical care time)  Medications Ordered in UC Medications - No data to display  Initial Impression / Assessment and Plan / UC Course  I have reviewed the triage vital signs and the nursing notes.  Pertinent labs & imaging results that were available during my care of the patient were reviewed by me and considered in my medical decision making (see chart for details).  Symptoms consistent with skin abscess.  Area is not amenable to I&D at this time.  Treat with doxycycline 100 mg twice daily for the next 7 days.  Supportive care recommendations were provided and discussed with the patient to include over-the-counter Tylenol , warm compresses to the affected area, and warm sitz bath.  Patient advised to cover the area if it begins to drain.  Discussed indications with patient regarding follow-up.  Patient was in agreement with this plan of care and verbalizes understanding.  All questions were answered.  Patient stable for discharge.  Final Clinical Impressions(s) / UC Diagnoses   Final diagnoses:  None   Discharge Instructions   None    ED Prescriptions   None    PDMP not reviewed this  encounter.   Hardy Lia, NP 11/22/23 1017

## 2023-11-22 NOTE — ED Triage Notes (Signed)
 Abscess on left side of groin area since Friday.

## 2023-11-24 ENCOUNTER — Other Ambulatory Visit: Payer: Self-pay | Admitting: Internal Medicine

## 2023-11-24 DIAGNOSIS — D2261 Melanocytic nevi of right upper limb, including shoulder: Secondary | ICD-10-CM | POA: Diagnosis not present

## 2023-11-24 DIAGNOSIS — L814 Other melanin hyperpigmentation: Secondary | ICD-10-CM | POA: Diagnosis not present

## 2023-11-24 DIAGNOSIS — M7582 Other shoulder lesions, left shoulder: Secondary | ICD-10-CM | POA: Diagnosis not present

## 2023-11-24 DIAGNOSIS — M7542 Impingement syndrome of left shoulder: Secondary | ICD-10-CM | POA: Diagnosis not present

## 2023-11-24 DIAGNOSIS — I10 Essential (primary) hypertension: Secondary | ICD-10-CM

## 2023-11-24 DIAGNOSIS — D225 Melanocytic nevi of trunk: Secondary | ICD-10-CM | POA: Diagnosis not present

## 2023-11-24 DIAGNOSIS — L821 Other seborrheic keratosis: Secondary | ICD-10-CM | POA: Diagnosis not present

## 2023-11-24 DIAGNOSIS — G2581 Restless legs syndrome: Secondary | ICD-10-CM

## 2023-12-01 DIAGNOSIS — I872 Venous insufficiency (chronic) (peripheral): Secondary | ICD-10-CM | POA: Diagnosis not present

## 2023-12-01 DIAGNOSIS — I87393 Chronic venous hypertension (idiopathic) with other complications of bilateral lower extremity: Secondary | ICD-10-CM | POA: Diagnosis not present

## 2023-12-01 DIAGNOSIS — R600 Localized edema: Secondary | ICD-10-CM | POA: Diagnosis not present

## 2023-12-01 DIAGNOSIS — R252 Cramp and spasm: Secondary | ICD-10-CM | POA: Diagnosis not present

## 2023-12-16 DIAGNOSIS — I83891 Varicose veins of right lower extremities with other complications: Secondary | ICD-10-CM | POA: Diagnosis not present

## 2023-12-29 DIAGNOSIS — I83892 Varicose veins of left lower extremities with other complications: Secondary | ICD-10-CM | POA: Diagnosis not present

## 2024-02-01 DIAGNOSIS — I83893 Varicose veins of bilateral lower extremities with other complications: Secondary | ICD-10-CM | POA: Diagnosis not present

## 2024-02-01 DIAGNOSIS — I872 Venous insufficiency (chronic) (peripheral): Secondary | ICD-10-CM | POA: Diagnosis not present

## 2024-02-01 DIAGNOSIS — R252 Cramp and spasm: Secondary | ICD-10-CM | POA: Diagnosis not present

## 2024-02-01 DIAGNOSIS — R600 Localized edema: Secondary | ICD-10-CM | POA: Diagnosis not present

## 2024-02-10 ENCOUNTER — Other Ambulatory Visit: Payer: Self-pay | Admitting: Neurology

## 2024-02-10 ENCOUNTER — Ambulatory Visit: Payer: Self-pay | Admitting: Internal Medicine

## 2024-02-10 VITALS — BP 126/80 | HR 94 | Temp 98.2°F | Ht 64.0 in | Wt 195.6 lb

## 2024-02-10 DIAGNOSIS — F411 Generalized anxiety disorder: Secondary | ICD-10-CM

## 2024-02-10 DIAGNOSIS — G2581 Restless legs syndrome: Secondary | ICD-10-CM

## 2024-02-10 MED ORDER — BUPROPION HCL ER (XL) 150 MG PO TB24: 150.0000 mg | ORAL_TABLET | Freq: Every day | ORAL | 1 refills | Status: AC

## 2024-02-10 NOTE — Progress Notes (Signed)
 Established Patient Office Visit     CC/Reason for Visit: Discuss anxiety and restless legs  HPI: Alexis Craig is a 43 y.o. female who is coming in today for the above mentioned reasons.  She is also under the care of neurology for her restless legs.  She is on pramipexole  and started on gabapentin  but it made her feel groggy so she has discontinued.  Her restless legs bother her almost every night.  She believes that is related to sertraline  and wants to come off of it.  Wellbutrin  has worked well for her in the past to control anxiety.   Past Medical/Surgical History: Past Medical History:  Diagnosis Date   Anal fissure 04/09/2009   Anemia    ANXIETY 03/09/2008   Depression    GERD (gastroesophageal reflux disease)    Gestational diabetes    metformin   High cholesterol    History of alcohol abuse    7 years ago   History of low potassium    Hx of varicella    Hypertension    INSOMNIA 10/05/2007   INTENTION TREMOR 06/13/2008   Low vitamin D  level    Migraines    NEPHROLITHIASIS, HX OF 08/23/2007   passed stone, no surgery   RECTAL BLEEDING 04/09/2009   RESTLESS LEG SYNDROME 01/22/2009    Past Surgical History:  Procedure Laterality Date   DILATION AND EVACUATION N/A 03/31/2019   Procedure: DILATATION AND EVACUATION with genetic studies;  Surgeon: Marget Lenis, MD;  Location: [facility] OR;  Service: Gynecology;  Laterality: N/A;  with genetic studies   EXCISIONAL HEMORRHOIDECTOMY     KIDNEY STONE SURGERY Right 07/2020   KIDNEY STONE SURGERY Left 08/2020   WISDOM TOOTH EXTRACTION      Social History:  reports that she quit smoking about 12 years ago. Her smoking use included cigarettes. She started smoking about 24 years ago. She has a 6 pack-year smoking history. She has never used smokeless tobacco. She reports that she does not drink alcohol and does not use drugs.  Allergies: Allergies  Allergen Reactions   Nsaids Rash    Blisters also   Ibuprofen      Fixed drug reaction with bullous dermatitis    Family History:  Family History  Problem Relation Age of Onset   Diabetes Mother    Thyroid  disease Mother    Cancer Mother    Heart disease Mother    Hypertension Mother    Hyperlipidemia Father    Diabetes Father    Heart disease Father    Hypertension Father    Cancer Maternal Grandmother        colon   Heart disease Paternal Grandfather    Heart disease Maternal Uncle    Restless legs syndrome Neg Hx      Current Outpatient Medications:    acetaminophen  (TYLENOL ) 500 MG tablet, Take 500 mg by mouth every 6 (six) hours as needed for moderate pain. , Disp: , Rfl:    ALPRAZolam  (XANAX ) 0.5 MG tablet, Take 1 tablet (0.5 mg total) by mouth daily as needed for anxiety (for airplane travel)., Disp: 20 tablet, Rfl: 0   amLODipine  (NORVASC ) 5 MG tablet, TAKE 1 TABLET(5 MG) BY MOUTH DAILY, Disp: 90 tablet, Rfl: 1   atorvastatin  (LIPITOR) 40 MG tablet, TAKE 1 TABLET(40 MG) BY MOUTH DAILY, Disp: 90 tablet, Rfl: 1   buPROPion  (WELLBUTRIN  XL) 150 MG 24 hr tablet, Take 1 tablet (150 mg total) by mouth daily., Disp: 90  tablet, Rfl: 1   cholecalciferol (VITAMIN D3) 25 MCG (1000 UT) tablet, Take 1,000 Units by mouth daily., Disp: , Rfl:    gabapentin  (NEURONTIN ) 100 MG capsule, TAKE 3 CAPSULES(300 MG) BY MOUTH AT BEDTIME, Disp: 90 capsule, Rfl: 3   omeprazole (PRILOSEC OTC) 20 MG tablet, Take 20 mg by mouth daily., Disp: , Rfl:    pramipexole  (MIRAPEX ) 0.5 MG tablet, TAKE 1 TABLET(0.5 MG) BY MOUTH AT BEDTIME AS NEEDED, Disp: 90 tablet, Rfl: 0   valsartan  (DIOVAN ) 160 MG tablet, TAKE 1 TABLET(160 MG) BY MOUTH DAILY, Disp: 90 tablet, Rfl: 1  Review of Systems:  Negative unless indicated in HPI.   Physical Exam: Vitals:   02/10/24 1324  BP: 126/80  Pulse: 94  Temp: 98.2 F (36.8 C)  TempSrc: Oral  SpO2: 100%  Weight: 195 lb 9 oz (88.7 kg)  Height: 5' 4 (1.626 m)    Body mass index is 33.57 kg/m.    Impression and  Plan:  RESTLESS LEG SYNDROME  Anxiety state  Other orders -     buPROPion  HCl ER (XL); Take 1 tablet (150 mg total) by mouth daily.  Dispense: 90 tablet; Refill: 1  -Start Wellbutrin  150 mg daily.  She is currently on 100 mg of sertraline , for the next 3 days she will cut it down to 50 mg and then cut down further to 25 mg for the next 3 days before discontinuing.  Follow-up with me in 6 to 8 weeks.   Time spent:30 minutes reviewing chart, interviewing and examining patient and formulating plan of care.     Tully Theophilus Andrews, MD [facility] at [facility]

## 2024-02-14 ENCOUNTER — Other Ambulatory Visit: Payer: Self-pay | Admitting: Internal Medicine

## 2024-02-14 DIAGNOSIS — G2581 Restless legs syndrome: Secondary | ICD-10-CM

## 2024-02-18 ENCOUNTER — Other Ambulatory Visit: Payer: Self-pay | Admitting: Internal Medicine

## 2024-02-18 DIAGNOSIS — E782 Mixed hyperlipidemia: Secondary | ICD-10-CM

## 2024-02-23 ENCOUNTER — Other Ambulatory Visit: Payer: Self-pay | Admitting: Internal Medicine

## 2024-02-23 DIAGNOSIS — F411 Generalized anxiety disorder: Secondary | ICD-10-CM

## 2024-02-23 DIAGNOSIS — I10 Essential (primary) hypertension: Secondary | ICD-10-CM

## 2024-02-25 ENCOUNTER — Ambulatory Visit: Admitting: Podiatry

## 2024-02-25 ENCOUNTER — Encounter: Payer: Self-pay | Admitting: Podiatry

## 2024-02-25 ENCOUNTER — Ambulatory Visit (INDEPENDENT_AMBULATORY_CARE_PROVIDER_SITE_OTHER)

## 2024-02-25 VITALS — Ht 64.0 in | Wt 195.6 lb

## 2024-02-25 DIAGNOSIS — M7752 Other enthesopathy of left foot: Secondary | ICD-10-CM | POA: Diagnosis not present

## 2024-02-25 DIAGNOSIS — M7750 Other enthesopathy of unspecified foot: Secondary | ICD-10-CM

## 2024-02-25 NOTE — Patient Instructions (Signed)
 For instructions on how to put on your Tri-Lock Ankle Brace, please visit [email or web address]  --  Peroneal Tendinopathy Rehab Ask your health care provider which exercises are safe for you. Do exercises exactly as told by your health care provider and adjust them as directed. It is normal to feel mild stretching, pulling, tightness, or discomfort as you do these exercises. Stop right away if you feel sudden pain or your pain gets worse. Do not begin these exercises until told by your health care provider. Stretching and range-of-motion exercises These exercises warm up your muscles and joints. They can help improve the movement and flexibility of your ankle. They may also help to relieve pain and stiffness. Gastrocnemius and soleus stretch, standing This is an exercise in which you stand on a step and use your body weight to stretch your calf muscles. To do this exercise: Stand on the edge of a step on the ball of your left / right foot. The ball of your foot is on the walking surface, right under your toes. Keep your other foot firmly on the same step. Hold on to the wall, a railing, or a chair for balance. Slowly lift your other foot, allowing your body weight to press your left / right heel down over the edge of the step. You should feel a stretch in your left / right calf (gastrocnemius and soleus). Hold this position for __________ seconds. Return both feet to the step. Repeat this exercise with a slight bend in your left / right knee. Repeat __________ times with your left / right knee straight and __________ times with your left / right knee bent. Complete this exercise __________ times a day. Strengthening exercises These exercises build strength and endurance in your foot and ankle. Endurance is the ability to use your muscles for a long time, even after they get tired. Ankle dorsiflexion with band  Secure a rubber exercise band or tube to an object, such as a table leg, that  will not move when the band is pulled. Secure the other end of the band around your left / right foot. Sit on the floor. Face the object with your left / right leg extended. The band or tube should be slightly tense when your foot is relaxed. Slowly flex your left / right ankle and toes to bring your foot toward you (dorsiflexion). Hold this position for __________ seconds. Let the band or tube slowly pull your foot back to the starting position. Repeat __________ times. Complete this exercise __________ times a day. Ankle eversion  Sit on the floor with your legs straight out in front of you. Loop a rubber exercise band or tube around the ball of your left / right foot. The ball of your foot is on the walking surface, right under your toes. Hold the ends of the band in your hands. You can also secure the band to a stable object. The band or tube should be slightly tense when your foot is relaxed. Slowly push your foot outward, away from your other leg (eversion). Hold this position for __________ seconds. Slowly return your foot to the starting position. Repeat __________ times. Complete this exercise __________ times a day. Plantar flexion, standing This exercise is sometimes called a standing heel raise. Stand with your feet shoulder-width apart. Place your hands on a wall or table to steady yourself as needed. Try not to use it for support. Keep your weight spread evenly over the width of your feet while you slowly  rise up on your toes (plantar flexion). If told by your health care provider: Shift your weight toward your left / right leg until you feel challenged. Stand on your left / right leg only. Hold this position for __________ seconds. Repeat __________ times. Complete this exercise __________ times a day. Single leg stand  Without shoes, stand near a railing or in a doorway. You may hold on to the railing or doorframe as needed. Stand on your left / right foot. Keep your big  toe down on the floor and try to keep your arch lifted. Do not roll to the outside of your foot. If this exercise is too easy, you can try it with your eyes closed or while standing on a pillow. Hold this position for __________ seconds. Repeat __________ times. Complete this exercise __________ times a day. This information is not intended to replace advice given to you by your health care provider. Make sure you discuss any questions you have with your health care provider. Document Revised: 10/10/2021 Document Reviewed: 10/10/2021 Elsevier Patient Education  2024 [employer].

## 2024-02-25 NOTE — Progress Notes (Signed)
  Subjective:  Patient ID: Alexis Craig, female    DOB: 02-Nov-1980,  MRN: [id number]  Chief Complaint  Patient presents with   Foot Pain    Pt is here due to left foot pain states it started during spring break she has plantar fasciitis but the pain is different from that.    Discussed the use of AI scribe software for clinical note transcription with the patient, who gave verbal consent to proceed.  History of Present Illness Alexis Craig is a 43 year old female who presents with progressive left foot pain.  She experiences progressive pain in her left foot, worsening with activity such as walking. The pain disrupts sleep and limits daily activities. It began after a trip involving extensive walking, with no specific injury. The pain is localized to a specific area of the left foot, pointing to the 5th metatarsal base. Swelling occurs after prolonged activity. She uses Tylenol  with minimal relief and is allergic to NSAIDs. Prednisone  has been tolerated in the past. No pain is present in the right foot.      Objective:    Physical Exam General: AAO x3, NAD  Dermatological: Skin is warm, dry and supple bilateral. There are no open sores, no preulcerative lesions, no rash or signs of infection present.  Vascular: Dorsalis Pedis artery and Posterior Tibial artery pedal pulses are 2/4 bilateral with immedate capillary fill time.  There is no pain with calf compression, swelling, warmth, erythema.   Neruologic: Grossly intact via light touch bilateral.   Musculoskeletal: There is tenderness palpation of the left from the fifth metatarsal base on the insertion of the peroneal tendon.  There is no other areas of pinpoint tenderness.  Flexor, extensor tendons appear to be intact.  MMT 5/5.  Gait: Unassisted, Nonantalgic.     No images are attached to the encounter.    Results RADIOLOGY Left Foot os peroneum is present.  No evidence of acute fracture.  Joint space  maintained otherwise.    Assessment:   1. Tendonitis of ankle or foot      Plan:  Patient was evaluated and treated and all questions answered.  Assessment and Plan Assessment & Plan Left peroneal tendon enthesopathy Chronic left peroneal tendon enthesopathy with progressive pain, exacerbated by activity. Differential diagnosis includes os perineum, but less likely. Allergic to NSAIDs. Informed consent obtained for steroid injection. - Administer steroid injection to reduce inflammation.  Skin clean with alcohol.  Mixture of 0.5 cc of Marcaine  plain, 0.5 cc of dexamethasone  phosphate was infiltrated into the fifth metatarsal distally from the peroneal tendon without any complications.  Postinjection care discussed.  Tolerated well. - Prescribe ankle brace for post-injection immobilization. - Provide exercises to strengthen peroneal tendon. - Schedule shoe insert modification with lateral wedge. - Advise regular icing to manage inflammation.   No follow-ups on file.   Donnice JONELLE Fees DPM

## 2024-02-26 ENCOUNTER — Ambulatory Visit

## 2024-02-26 NOTE — Progress Notes (Signed)
 Patient was here and lateral posting added to left Foot orthotic patient was very happy with fit and function and will call office if any other problems arise  Lolita Schultze Cped, CFo, CFm

## 2024-03-14 ENCOUNTER — Ambulatory Visit (INDEPENDENT_AMBULATORY_CARE_PROVIDER_SITE_OTHER): Admitting: Plastic Surgery

## 2024-03-14 ENCOUNTER — Encounter: Payer: Self-pay | Admitting: Plastic Surgery

## 2024-03-14 VITALS — BP 129/83 | HR 98 | Ht 63.0 in | Wt 194.6 lb

## 2024-03-14 DIAGNOSIS — L989 Disorder of the skin and subcutaneous tissue, unspecified: Secondary | ICD-10-CM | POA: Diagnosis not present

## 2024-03-14 NOTE — Progress Notes (Signed)
   Subjective:    Patient ID: Alexis Craig, female    DOB: 1981-05-27, 43 y.o.   MRN: [id number]  The patient is a 43 year old female here for evaluation of her face.  She has had several skin lesions removed in the past.  The recent ones were not skin cancer.  She has another 1 on the left lower eyelid.  It is getting quite a bit larger and she would like to see about having it removed there is also 1 on her neck that slightly anterior.  I think it is reasonable to remove these.  The one that is by the eye I would like to send to pathology for further evaluation.      Review of Systems  Constitutional: Negative.   HENT: Negative.    Eyes: Negative.   Respiratory: Negative.    Cardiovascular: Negative.   Gastrointestinal: Negative.   Endocrine: Negative.   Genitourinary: Negative.   Musculoskeletal: Negative.        Objective:   Physical Exam Vitals reviewed.  Constitutional:      Appearance: Normal appearance.  HENT:     Head: Atraumatic.  Cardiovascular:     Rate and Rhythm: Normal rate.     Pulses: Normal pulses.  Pulmonary:     Effort: Pulmonary effort is normal.  Abdominal:     Palpations: Abdomen is soft.  Skin:    General: Skin is warm.     Capillary Refill: Capillary refill takes less than 2 seconds.  Neurological:     Mental Status: She is alert and oriented to person, place, and time.  Psychiatric:        Mood and Affect: Mood normal.        Behavior: Behavior normal.        Thought Content: Thought content normal.        Judgment: Judgment normal.        Assessment & Plan:     ICD-10-CM   1. Changing skin lesion  L98.9        Plan for excision of changing skin lesion left lower eyelid that is positioned medially and left neck.  Will send the 1 on the eye to pathology.  Pictures were obtained of the patient and placed in the chart with the patient's or guardian's permission.

## 2024-03-17 ENCOUNTER — Other Ambulatory Visit: Payer: Self-pay | Admitting: Neurology

## 2024-03-17 NOTE — Telephone Encounter (Signed)
 Last filled by patient 02/10/24 last visit was 01/08/23 next office visit not sheduled. Patient needs to shedule a follow-up visit. Patient has been notified via mychart.

## 2024-03-22 ENCOUNTER — Other Ambulatory Visit (HOSPITAL_COMMUNITY)
Admission: RE | Admit: 2024-03-22 | Discharge: 2024-03-22 | Disposition: A | Discharge status: Home / Self Care | Source: Ambulatory Visit | Attending: Plastic Surgery | Admitting: Plastic Surgery

## 2024-03-22 ENCOUNTER — Ambulatory Visit (INDEPENDENT_AMBULATORY_CARE_PROVIDER_SITE_OTHER): Admitting: Plastic Surgery

## 2024-03-22 VITALS — BP 118/84 | HR 101

## 2024-03-22 DIAGNOSIS — L820 Inflamed seborrheic keratosis: Secondary | ICD-10-CM | POA: Diagnosis not present

## 2024-03-22 DIAGNOSIS — L089 Local infection of the skin and subcutaneous tissue, unspecified: Secondary | ICD-10-CM | POA: Diagnosis not present

## 2024-03-22 DIAGNOSIS — L821 Other seborrheic keratosis: Secondary | ICD-10-CM

## 2024-03-22 DIAGNOSIS — L720 Epidermal cyst: Secondary | ICD-10-CM

## 2024-03-22 DIAGNOSIS — L989 Disorder of the skin and subcutaneous tissue, unspecified: Secondary | ICD-10-CM | POA: Diagnosis not present

## 2024-03-22 NOTE — Progress Notes (Signed)
 Procedure Note  Preoperative Dx: changing skin lesions  Postoperative Dx: Same  Procedure:  Changing skin lesion of left neck 1 cm Changing skin lesion of left lower eyelid 5 mm  Anesthesia: Lidocaine  1% with 1:100,000 epinephrine   Indication for Procedure: skin lesion  Description of Procedure: Risks and complications were explained to the patient.  Consent was confirmed and the patient understands the risks and benefits.  The potential complications and alternatives were explained and the patient consents.  The patient expressed understanding the option of not having the procedure and the risks of a scar.  Time out was called and all information was confirmed to be correct.    Neck: The area was prepped and drapped.  Lidocaine  1% with epinephrine  was injected in the subcutaneous area.  After waiting several minutes for the local to take affect a #15 blade was used to excise the area in an eliptical pattern.  The skin edges were reapproximated with 6-0 Monocryl subcuticular running closure.    Eye lid: The area was prepped and drapped.  Lidocaine  1% with epinephrine  was injected in the subcutaneous area.  After waiting several minutes for the local to take affect a #15 blade was used to excise the area in an eliptical pattern.  The skin edges were reapproximated with 6-0 Monocryl subcuticular running closure.   A dressing was applied.  The patient was given instructions on how to care for the area and a follow up appointment.  Alexis Craig tolerated the procedure well and there were no complications. The specimen was sent to pathology.

## 2024-03-25 LAB — SURGICAL PATHOLOGY

## 2024-04-04 ENCOUNTER — Ambulatory Visit (INDEPENDENT_AMBULATORY_CARE_PROVIDER_SITE_OTHER): Payer: Self-pay | Admitting: Student

## 2024-04-04 VITALS — BP 112/76 | HR 98

## 2024-04-04 DIAGNOSIS — L989 Disorder of the skin and subcutaneous tissue, unspecified: Secondary | ICD-10-CM

## 2024-04-04 NOTE — Progress Notes (Signed)
 Patient is a 43 year old female who recently underwent excision of changing skin lesion to the left neck and excision of changing skin lesion to the left lower eyelid with Dr. Lowery on 03/22/2024.  Per procedure note, it appears that the lesion near the eyelid was sent to pathology, but the neck lesion was not.  Patient is almost 2 weeks out from her procedure.  She presents to the clinic today for postprocedural follow-up.  Per chart review, the skin lesion to the left lower eyelid showed early polypoid seborrheic keratosis and epidermoid cyst, crusted and inflamed.  There was no malignancy seen.  Today, patient reports she is doing well.  She denies any issues or concerns with the procedure site.  She denies any fevers or chills, she denies any drainage or pain.  We discussed the results of the pathology.  She expressed understanding.  On exam, patient sitting upright in no acute distress.  To the left lower eyelid, incision is intact with Monocryl suture.  There is no surrounding erythema, swelling or drainage.  The Monocryl suture was removed without any difficulty.  Patient tolerated well.  To the left neck, incision is overall intact with Monocryl sutures.  There is a little bit of mild surrounding irritation.  No swelling, drainage or tenderness to palpation.  No signs of infection.  Monocryl sutures were removed without any difficulty.  Patient tolerated well.  Recommended that patient apply Vaseline to her incision daily for the next week or so.  Discussed with her then she may transition to scar creams, recommended silicone based therapy.  Discussed with the patient to avoid direct sunlight to the incisions as this can worsen the scar.  Patient expressed understanding.  Patient to follow-up as needed.  Instructed her to call if she has any questions or concerns about anything.

## 2024-04-12 ENCOUNTER — Telehealth: Admitting: Physician Assistant

## 2024-04-12 DIAGNOSIS — B9689 Other specified bacterial agents as the cause of diseases classified elsewhere: Secondary | ICD-10-CM

## 2024-04-12 DIAGNOSIS — J069 Acute upper respiratory infection, unspecified: Secondary | ICD-10-CM | POA: Diagnosis not present

## 2024-04-12 MED ORDER — ALBUTEROL SULFATE HFA 108 (90 BASE) MCG/ACT IN AERS: 2.0000 | INHALATION_SPRAY | Freq: Four times a day (QID) | RESPIRATORY_TRACT | 0 refills | Status: DC | PRN

## 2024-04-12 MED ORDER — DOXYCYCLINE HYCLATE 100 MG PO TABS: 100.0000 mg | ORAL_TABLET | Freq: Two times a day (BID) | ORAL | 0 refills | Status: DC

## 2024-04-12 MED ORDER — PROMETHAZINE-DM 6.25-15 MG/5ML PO SYRP: 5.0000 mL | ORAL_SOLUTION | Freq: Four times a day (QID) | ORAL | 0 refills | Status: DC | PRN

## 2024-04-12 NOTE — Progress Notes (Signed)
 Virtual Visit Consent   Alexis Craig, you are scheduled for a virtual visit with a [facility] provider today. Just as with appointments in the office, your consent must be obtained to participate. Your consent will be active for this visit and any virtual visit you may have with one of our providers in the next 365 days. If you have a MyChart account, a copy of this consent can be sent to you electronically.  As this is a virtual visit, video technology does not allow for your provider to perform a traditional examination. This may limit your provider's ability to fully assess your condition. If your provider identifies any concerns that need to be evaluated in person or the need to arrange testing (such as labs, EKG, etc.), we will make arrangements to do so. Although advances in technology are sophisticated, we cannot ensure that it will always work on either your end or our end. If the connection with a video visit is poor, the visit may have to be switched to a telephone visit. With either a video or telephone visit, we are not always able to ensure that we have a secure connection.  By engaging in this virtual visit, you consent to the provision of healthcare and authorize for your insurance to be billed (if applicable) for the services provided during this visit. Depending on your insurance coverage, you may receive a charge related to this service.  I need to obtain your verbal consent now. Are you willing to proceed with your visit today? Alexis Craig has provided verbal consent on 04/12/2024 for a virtual visit (video or telephone). Alexis Craig, [place]  Date: 04/12/2024 12:43 PM   Virtual Visit via Video Note   I, Alexis Craig, connected with  Alexis Craig  ([id number], [birth year]) on 04/12/24 at 12:30 PM EDT by a video-enabled telemedicine application and verified that I am speaking with the correct person using two identifiers.  Location: Patient:  Virtual Visit Location Patient: Home Provider: Virtual Visit Location Provider: Home Office   I discussed the limitations of evaluation and management by telemedicine and the availability of in person appointments. The patient expressed understanding and agreed to proceed.    History of Present Illness: Alexis Craig is a 43 y.o. who identifies as a female who was assigned female at birth, and is being seen today for some progressive URI symptoms after recent influenza illness. Notes starting flu-like symptoms Wed of last week, testing positive for Flu A on Friday (testing through work). Notes initial flu-like symptoms have resolved but since weekend has had progressing cough and congestion with substantial sinus pressure and sinus pain. Denies fever, chills, chest pain.SABRA Has expired script for cough medication that she has been using with only some relief.  HPI: HPI  Problems:  Patient Active Problem List   Diagnosis Date Noted   Vitamin D  deficiency 12/03/2022   Hypokalemia 12/03/2022   Hypertension 02/27/2021   Hyperlipidemia 02/27/2021   Changing skin lesion 01/13/2020   Gestational diabetes mellitus (GDM) affecting pregnancy 03/11/2016   RESTLESS LEG SYNDROME 01/22/2009   INTENTION TREMOR 06/13/2008   Anxiety state 03/09/2008   INSOMNIA 10/05/2007   Headache 08/23/2007   NEPHROLITHIASIS, HX OF 08/23/2007    Allergies:  Allergies  Allergen Reactions   Nsaids Rash    Blisters also   Ibuprofen     Fixed drug reaction with bullous dermatitis   Medications:  Current Outpatient Medications:    albuterol (VENTOLIN HFA) 108 (90  Base) MCG/ACT inhaler, Inhale 2 puffs into the lungs every 6 (six) hours as needed for wheezing or shortness of breath., Disp: 8 g, Rfl: 0   doxycycline  (VIBRA -TABS) 100 MG tablet, Take 1 tablet (100 mg total) by mouth 2 (two) times daily., Disp: 14 tablet, Rfl: 0   promethazine -dextromethorphan (PROMETHAZINE -DM) 6.25-15 MG/5ML syrup, Take 5 mLs by  mouth 4 (four) times daily as needed for cough., Disp: 118 mL, Rfl: 0   ALPRAZolam  (XANAX ) 0.5 MG tablet, Take 1 tablet (0.5 mg total) by mouth daily as needed for anxiety (for airplane travel)., Disp: 20 tablet, Rfl: 0   amLODipine  (NORVASC ) 5 MG tablet, TAKE 1 TABLET(5 MG) BY MOUTH DAILY, Disp: 90 tablet, Rfl: 1   atorvastatin  (LIPITOR) 40 MG tablet, TAKE 1 TABLET(40 MG) BY MOUTH DAILY, Disp: 90 tablet, Rfl: 0   buPROPion  (WELLBUTRIN  XL) 150 MG 24 hr tablet, Take 1 tablet (150 mg total) by mouth daily., Disp: 90 tablet, Rfl: 1   gabapentin  (NEURONTIN ) 100 MG capsule, TAKE 3 CAPSULES(300 MG) BY MOUTH AT BEDTIME, Disp: 90 capsule, Rfl: 1   omeprazole (PRILOSEC OTC) 20 MG tablet, Take 20 mg by mouth daily., Disp: , Rfl:    pramipexole  (MIRAPEX ) 0.5 MG tablet, TAKE 1 TABLET(0.5 MG) BY MOUTH AT BEDTIME AS NEEDED, Disp: 90 tablet, Rfl: 0   valsartan  (DIOVAN ) 160 MG tablet, TAKE 1 TABLET(160 MG) BY MOUTH DAILY, Disp: 90 tablet, Rfl: 1  Observations/Objective: Patient is well-developed, well-nourished in no acute distress.  Resting comfortably at home.  Head is normocephalic, atraumatic.  No labored breathing. Speech is clear and coherent with logical content.  Patient is alert and oriented at baseline.   Assessment and Plan: 1. Bacterial URI (Primary) - doxycycline  (VIBRA -TABS) 100 MG tablet; Take 1 tablet (100 mg total) by mouth 2 (two) times daily.  Dispense: 14 tablet; Refill: 0 - promethazine -dextromethorphan (PROMETHAZINE -DM) 6.25-15 MG/5ML syrup; Take 5 mLs by mouth 4 (four) times daily as needed for cough.  Dispense: 118 mL; Refill: 0 - albuterol (VENTOLIN HFA) 108 (90 Base) MCG/ACT inhaler; Inhale 2 puffs into the lungs every 6 (six) hours as needed for wheezing or shortness of breath.  Dispense: 8 g; Refill: 0  After influenza. Concerned for secondary infection. Supportive measures and OTC medications reviewed. Promethazine -DM per orders along with Albuterol for bronchospasm. Doxycycline   per orders. In-person follow-up precautions reviewed.   Follow Up Instructions: I discussed the assessment and treatment plan with the patient. The patient was provided an opportunity to ask questions and all were answered. The patient agreed with the plan and demonstrated an understanding of the instructions.  A copy of instructions were sent to the patient via MyChart unless otherwise noted below.   The patient was advised to call back or seek an in-person evaluation if the symptoms worsen or if the condition fails to improve as anticipated.    Alexis Velma Lunger, PA-C

## 2024-04-12 NOTE — Patient Instructions (Signed)
 Alexis Craig, thank you for joining Alexis Velma Lunger, PA-C for today's virtual visit.  While this provider is not your primary care provider (PCP), if your PCP is located in our provider database this encounter information will be shared with them immediately following your visit.   A [facility] MyChart account gives you access to today's visit and all your visits, tests, and labs performed at [facility]  click here if you don't have a [facility] MyChart account or go to [email or web address]  Consent: (Patient) Alexis Craig provided verbal consent for this virtual visit at the beginning of the encounter.  Current Medications:  Current Outpatient Medications:    ALPRAZolam  (XANAX ) 0.5 MG tablet, Take 1 tablet (0.5 mg total) by mouth daily as needed for anxiety (for airplane travel)., Disp: 20 tablet, Rfl: 0   amLODipine  (NORVASC ) 5 MG tablet, TAKE 1 TABLET(5 MG) BY MOUTH DAILY, Disp: 90 tablet, Rfl: 1   atorvastatin  (LIPITOR) 40 MG tablet, TAKE 1 TABLET(40 MG) BY MOUTH DAILY, Disp: 90 tablet, Rfl: 0   buPROPion  (WELLBUTRIN  XL) 150 MG 24 hr tablet, Take 1 tablet (150 mg total) by mouth daily., Disp: 90 tablet, Rfl: 1   gabapentin  (NEURONTIN ) 100 MG capsule, TAKE 3 CAPSULES(300 MG) BY MOUTH AT BEDTIME, Disp: 90 capsule, Rfl: 1   omeprazole (PRILOSEC OTC) 20 MG tablet, Take 20 mg by mouth daily., Disp: , Rfl:    pramipexole  (MIRAPEX ) 0.5 MG tablet, TAKE 1 TABLET(0.5 MG) BY MOUTH AT BEDTIME AS NEEDED, Disp: 90 tablet, Rfl: 0   valsartan  (DIOVAN ) 160 MG tablet, TAKE 1 TABLET(160 MG) BY MOUTH DAILY, Disp: 90 tablet, Rfl: 1   Medications ordered in this encounter:  No orders of the defined types were placed in this encounter.    *If you need refills on other medications prior to your next appointment, please contact your pharmacy*  Follow-Up: Call back or seek an in-person evaluation if the symptoms worsen or if the condition fails to improve as  anticipated.  [facility] [phone]  Other Instructions Take antibiotic (Doxycycline ) as directed.  Increase fluids.  Get plenty of rest. Use Mucinex for congestion. Take the prescribed medications as directed. Take a daily probiotic (I recommend Align or Culturelle, but even Activia Yogurt may be beneficial).  A humidifier placed in the bedroom may offer some relief for a dry, scratchy throat of nasal irritation.  Read information below on acute bronchitis. Please call or return to clinic if symptoms are not improving.  Acute Bronchitis Bronchitis is when the airways that extend from the windpipe into the lungs get red, puffy, and painful (inflamed). Bronchitis often causes thick spit (mucus) to develop. This leads to a cough. A cough is the most common symptom of bronchitis. In acute bronchitis, the condition usually begins suddenly and goes away over time (usually in 2 weeks). Smoking, allergies, and asthma can make bronchitis worse. Repeated episodes of bronchitis may cause more lung problems.  HOME CARE Rest. Drink enough fluids to keep your pee (urine) clear or pale yellow (unless you need to limit fluids as told by your doctor). Only take over-the-counter or prescription medicines as told by your doctor. Avoid smoking and secondhand smoke. These can make bronchitis worse. If you are a smoker, think about using nicotine gum or skin patches. Quitting smoking will help your lungs heal faster. Reduce the chance of getting bronchitis again by: Washing your hands often. Avoiding people with cold symptoms. Trying not to touch your hands  to your mouth, nose, or eyes. Follow up with your doctor as told.  GET HELP IF: Your symptoms do not improve after 1 week of treatment. Symptoms include: Cough. Fever. Coughing up thick spit. Body aches. Chest congestion. Chills. Shortness of breath. Sore throat.  GET HELP RIGHT AWAY IF:  You have an increased fever. You have  chills. You have severe shortness of breath. You have bloody thick spit (sputum). You throw up (vomit) often. You lose too much body fluid (dehydration). You have a severe headache. You faint.  MAKE SURE YOU:  Understand these instructions. Will watch your condition. Will get help right away if you are not doing well or get worse. Document Released: 12/03/2007 Document Revised: 02/16/2013 Document Reviewed: 12/07/2012 [facility] Patient Information 2015 [place] [employer] [place], [place]. This information is not intended to replace advice given to you by your health care provider. Make sure you discuss any questions you have with your health care provider.    If you have been instructed to have an in-person evaluation today at a local Urgent Care facility, please use the link below. It will take you to a list of all of our available [facility], including address, phone number and hours of operation. Please do not delay care.  [facility]  If you or a family member do not have a primary care provider, use the link below to schedule a visit and establish care. When you choose a [facility] primary care physician or advanced practice provider, you gain a long-term partner in health. Find a Primary Care Provider  Learn more about [facility] in-office and virtual care options:  - Get Care Now

## 2024-05-14 ENCOUNTER — Other Ambulatory Visit: Payer: Self-pay | Admitting: Internal Medicine

## 2024-05-14 DIAGNOSIS — E782 Mixed hyperlipidemia: Secondary | ICD-10-CM

## 2024-05-17 ENCOUNTER — Other Ambulatory Visit: Payer: Self-pay | Admitting: Internal Medicine

## 2024-05-17 DIAGNOSIS — I10 Essential (primary) hypertension: Secondary | ICD-10-CM

## 2024-05-17 DIAGNOSIS — G2581 Restless legs syndrome: Secondary | ICD-10-CM

## 2024-05-29 ENCOUNTER — Other Ambulatory Visit: Payer: Self-pay | Admitting: Neurology

## 2024-06-01 DIAGNOSIS — N200 Calculus of kidney: Secondary | ICD-10-CM | POA: Diagnosis not present

## 2024-06-02 ENCOUNTER — Encounter: Payer: Self-pay | Admitting: Internal Medicine

## 2024-06-02 ENCOUNTER — Ambulatory Visit: Payer: Self-pay | Admitting: Internal Medicine

## 2024-06-02 ENCOUNTER — Ambulatory Visit: Admitting: Internal Medicine

## 2024-06-02 ENCOUNTER — Ambulatory Visit: Attending: Internal Medicine

## 2024-06-02 VITALS — BP 120/78 | HR 103 | Temp 98.2°F | Ht 65.5 in | Wt 177.6 lb

## 2024-06-02 DIAGNOSIS — R5383 Other fatigue: Secondary | ICD-10-CM | POA: Diagnosis not present

## 2024-06-02 DIAGNOSIS — E876 Hypokalemia: Secondary | ICD-10-CM

## 2024-06-02 DIAGNOSIS — R002 Palpitations: Secondary | ICD-10-CM

## 2024-06-02 DIAGNOSIS — E782 Mixed hyperlipidemia: Secondary | ICD-10-CM

## 2024-06-02 DIAGNOSIS — I10 Essential (primary) hypertension: Secondary | ICD-10-CM | POA: Diagnosis not present

## 2024-06-02 DIAGNOSIS — E559 Vitamin D deficiency, unspecified: Secondary | ICD-10-CM

## 2024-06-02 DIAGNOSIS — Z0000 Encounter for general adult medical examination without abnormal findings: Secondary | ICD-10-CM | POA: Diagnosis not present

## 2024-06-02 DIAGNOSIS — Z1211 Encounter for screening for malignant neoplasm of colon: Secondary | ICD-10-CM

## 2024-06-02 LAB — LIPID PANEL
Cholesterol: 131 mg/dL (ref 0–200)
HDL: 41.7 mg/dL (ref >39.00)
LDL Cholesterol: 68 mg/dL (ref 0–99)
NonHDL: 89.71
Total CHOL/HDL Ratio: 3
Triglycerides: 111 mg/dL (ref 0.0–149.0)
VLDL: 22.2 mg/dL (ref 0.0–40.0)

## 2024-06-02 LAB — COMPREHENSIVE METABOLIC PANEL WITH GFR
ALT: 12 U/L (ref 0–35)
AST: 12 U/L (ref 0–37)
Albumin: 4.4 g/dL (ref 3.5–5.2)
Alkaline Phosphatase: 47 U/L (ref 39–117)
BUN: 15 mg/dL (ref 6–23)
CO2: 24 meq/L (ref 19–32)
Calcium: 9.5 mg/dL (ref 8.4–10.5)
Chloride: 108 meq/L (ref 96–112)
Creatinine, Ser: 0.73 mg/dL (ref 0.40–1.20)
GFR: 99.37 mL/min (ref >60.00)
Glucose, Bld: 98 mg/dL (ref 70–99)
Potassium: 3.7 meq/L (ref 3.5–5.1)
Sodium: 140 meq/L (ref 135–145)
Total Bilirubin: 0.3 mg/dL (ref 0.2–1.2)
Total Protein: 6.9 g/dL (ref 6.0–8.3)

## 2024-06-02 LAB — CBC WITH DIFFERENTIAL/PLATELET
Basophils Absolute: 0 K/uL (ref 0.0–0.1)
Basophils Relative: 0.3 % (ref 0.0–3.0)
Eosinophils Absolute: 0.1 K/uL (ref 0.0–0.7)
Eosinophils Relative: 1.9 % (ref 0.0–5.0)
HCT: 38 % (ref 36.0–46.0)
Hemoglobin: 12.4 g/dL (ref 12.0–15.0)
Lymphocytes Relative: 32.5 % (ref 12.0–46.0)
Lymphs Abs: 2 K/uL (ref 0.7–4.0)
MCHC: 32.7 g/dL (ref 30.0–36.0)
MCV: 80.2 fl (ref 78.0–100.0)
Monocytes Absolute: 0.6 K/uL (ref 0.1–1.0)
Monocytes Relative: 9.8 % (ref 3.0–12.0)
Neutro Abs: 3.4 K/uL (ref 1.4–7.7)
Neutrophils Relative %: 55.5 % (ref 43.0–77.0)
Platelets: 380 K/uL (ref 150.0–400.0)
RBC: 4.74 Mil/uL (ref 3.87–5.11)
RDW: 16.3 % — ABNORMAL HIGH (ref 11.5–15.5)
WBC: 6.1 K/uL (ref 4.0–10.5)

## 2024-06-02 LAB — VITAMIN D 25 HYDROXY (VIT D DEFICIENCY, FRACTURES): VITD: 24.02 ng/mL — ABNORMAL LOW (ref 30.00–100.00)

## 2024-06-02 LAB — VITAMIN B12: Vitamin B-12: 390 pg/mL (ref 211–911)

## 2024-06-02 LAB — TSH: TSH: 1.68 u[IU]/mL (ref 0.35–5.50)

## 2024-06-02 MED ORDER — VITAMIN D (ERGOCALCIFEROL) 1.25 MG (50000 UNIT) PO CAPS: 50000.0000 [IU] | ORAL_CAPSULE | ORAL | 0 refills | Status: AC

## 2024-06-02 NOTE — Progress Notes (Signed)
 Established Patient Office Visit     CC/Reason for Visit: Annual preventive exam, palpitations  HPI: Alexis Craig is a 43 y.o. female who is coming in today for the above mentioned reasons. Past Medical History is significant for: Hypertension, hyperlipidemia, vitamin D  deficiency, restless leg syndrome and generalized anxiety disorder.  Has been experiencing palpitations.  Her Apple Watch will frequently detect an elevated heart rate.  She has routine eye and dental care.  Immunizations are up-to-date.  She is due for initial screening colonoscopy.  She has routine breast and cervical cancer screening with GYN.   Past Medical/Surgical History: Past Medical History:  Diagnosis Date   Anal fissure 04/09/2009   Anemia    ANXIETY 03/09/2008   Depression    GERD (gastroesophageal reflux disease)    Gestational diabetes    metformin   High cholesterol    History of alcohol abuse    7 years ago   History of low potassium    Hx of varicella    Hypertension    INSOMNIA 10/05/2007   INTENTION TREMOR 06/13/2008   Low vitamin D  level    Migraines    NEPHROLITHIASIS, HX OF 08/23/2007   passed stone, no surgery   RECTAL BLEEDING 04/09/2009   RESTLESS LEG SYNDROME 01/22/2009    Past Surgical History:  Procedure Laterality Date   DILATION AND EVACUATION N/A 03/31/2019   Procedure: DILATATION AND EVACUATION with genetic studies;  Surgeon: Marget Lenis, MD;  Location: [facility] OR;  Service: Gynecology;  Laterality: N/A;  with genetic studies   EXCISIONAL HEMORRHOIDECTOMY     KIDNEY STONE SURGERY Right 07/2020   KIDNEY STONE SURGERY Left 08/2020   WISDOM TOOTH EXTRACTION      Social History:  reports that she quit smoking about 12 years ago. Her smoking use included cigarettes. She started smoking about 24 years ago. She has a 6 pack-year smoking history. She has never used smokeless tobacco. She reports that she does not drink alcohol and does not use  drugs.  Allergies: Allergies  Allergen Reactions   Nsaids Rash    Blisters also   Ibuprofen     Fixed drug reaction with bullous dermatitis    Family History:  Family History  Problem Relation Age of Onset   Diabetes Mother    Thyroid  disease Mother    Cancer Mother    Heart disease Mother    Hypertension Mother    Hyperlipidemia Father    Diabetes Father    Heart disease Father    Hypertension Father    Cancer Maternal Grandmother        colon   Heart disease Paternal Grandfather    Heart disease Maternal Uncle    Restless legs syndrome Neg Hx      Current Outpatient Medications:    ALPRAZolam  (XANAX ) 0.5 MG tablet, Take 1 tablet (0.5 mg total) by mouth daily as needed for anxiety (for airplane travel)., Disp: 20 tablet, Rfl: 0   amLODipine  (NORVASC ) 5 MG tablet, TAKE 1 TABLET(5 MG) BY MOUTH DAILY, Disp: 90 tablet, Rfl: 1   atorvastatin  (LIPITOR) 40 MG tablet, TAKE 1 TABLET(40 MG) BY MOUTH DAILY, Disp: 90 tablet, Rfl: 0   buPROPion  (WELLBUTRIN  XL) 150 MG 24 hr tablet, Take 1 tablet (150 mg total) by mouth daily., Disp: 90 tablet, Rfl: 1   gabapentin  (NEURONTIN ) 100 MG capsule, TAKE 3 CAPSULES(300 MG) BY MOUTH AT BEDTIME, Disp: 90 capsule, Rfl: 1   omeprazole (PRILOSEC OTC) 20 MG tablet,  Take 20 mg by mouth daily., Disp: , Rfl:    pramipexole  (MIRAPEX ) 0.5 MG tablet, TAKE 1 TABLET(0.5 MG) BY MOUTH AT BEDTIME AS NEEDED, Disp: 90 tablet, Rfl: 0   valsartan  (DIOVAN ) 160 MG tablet, TAKE 1 TABLET(160 MG) BY MOUTH DAILY, Disp: 90 tablet, Rfl: 1  Review of Systems:  Negative unless indicated in HPI.   Physical Exam: Vitals:   06/02/24 0810  BP: 120/78  Pulse: (!) 103  Temp: 98.2 F (36.8 C)  TempSrc: Oral  SpO2: 100%  Weight: 177 lb 9.6 oz (80.6 kg)  Height: 5' 5.5 (1.664 m)    Body mass index is 29.1 kg/m.   Physical Exam Vitals reviewed.  Constitutional:      General: She is not in acute distress.    Appearance: Normal appearance. She is not  ill-appearing, toxic-appearing or diaphoretic.  HENT:     Head: Normocephalic.     Right Ear: Tympanic membrane, ear canal and external ear normal. There is no impacted cerumen.     Left Ear: Tympanic membrane, ear canal and external ear normal. There is no impacted cerumen.     Nose: Nose normal.     Mouth/Throat:     Mouth: Mucous membranes are moist.     Pharynx: Oropharynx is clear. No oropharyngeal exudate or posterior oropharyngeal erythema.  Eyes:     General: No scleral icterus.       Right eye: No discharge.        Left eye: No discharge.     Conjunctiva/sclera: Conjunctivae normal.     Pupils: Pupils are equal, round, and reactive to light.  Neck:     Vascular: No carotid bruit.  Cardiovascular:     Rate and Rhythm: Normal rate and regular rhythm.     Pulses: Normal pulses.     Heart sounds: Normal heart sounds.  Pulmonary:     Effort: Pulmonary effort is normal. No respiratory distress.     Breath sounds: Normal breath sounds.  Abdominal:     General: Abdomen is flat. Bowel sounds are normal.     Palpations: Abdomen is soft.  Musculoskeletal:        General: Normal range of motion.     Cervical back: Normal range of motion.  Skin:    General: Skin is warm and dry.  Neurological:     General: No focal deficit present.     Mental Status: She is alert and oriented to person, place, and time. Mental status is at baseline.  Psychiatric:        Mood and Affect: Mood normal.        Behavior: Behavior normal.        Thought Content: Thought content normal.        Judgment: Judgment normal.      Impression and Plan:  Encounter for preventive health examination  Primary hypertension -     CBC with Differential/Platelet; Future -     Comprehensive metabolic panel with GFR; Future  Mixed hyperlipidemia -     Lipid panel; Future  Vitamin D  deficiency -     VITAMIN D  25 Hydroxy (Vit-D Deficiency, Fractures); Future  Hypokalemia  Hypercalcemia  Other  fatigue -     Vitamin B12; Future  Screening for malignant neoplasm of colon -     Ambulatory referral to Gastroenterology  Palpitations -     TSH; Future -     LONG TERM MONITOR (3-14 DAYS); Future   -Recommend routine eye and  dental care. -Healthy lifestyle discussed in detail. -Labs to be updated today. -Prostate cancer screening: N/A Health Maintenance  Topic Date Due   Hepatitis B Vaccine (1 of 3 - 19+ 3-dose series) Never done   HPV Vaccine (1 - Risk 3-dose SCDM series) Never done   Colon Cancer Screening  Never done   COVID-19 Vaccine (7 - 2025-26 season) 02/29/2024   Breast Cancer Screening  09/03/2024   Pap with HPV screening  09/04/2025   DTaP/Tdap/Td vaccine (3 - Td or Tdap) 12/03/2025   Flu Shot  Completed   Hepatitis C Screening  Completed   HIV Screening  Completed   Pneumococcal Vaccine  Aged Out   Meningitis B Vaccine  Aged Out     - All immunizations are up-to-date. - Refer to GI for initial screening colonoscopy. - Obtain records from cervical and breast cancer screening from GYN. - In regards to palpitation check CMP for electrolytes, TSH and place referral for 14-day event monitor.    Tully Theophilus Andrews, MD [facility]

## 2024-06-02 NOTE — Progress Notes (Unsigned)
 EP to read.

## 2024-06-07 ENCOUNTER — Encounter: Payer: Self-pay | Admitting: Internal Medicine

## 2024-06-07 DIAGNOSIS — N200 Calculus of kidney: Secondary | ICD-10-CM | POA: Diagnosis not present

## 2024-06-07 DIAGNOSIS — K5730 Diverticulosis of large intestine without perforation or abscess without bleeding: Secondary | ICD-10-CM | POA: Diagnosis not present

## 2024-06-07 DIAGNOSIS — K429 Umbilical hernia without obstruction or gangrene: Secondary | ICD-10-CM | POA: Diagnosis not present

## 2024-06-07 DIAGNOSIS — N202 Calculus of kidney with calculus of ureter: Secondary | ICD-10-CM | POA: Diagnosis not present

## 2024-06-28 ENCOUNTER — Other Ambulatory Visit: Payer: Self-pay | Admitting: Neurology

## 2024-06-28 NOTE — Telephone Encounter (Signed)
 Pt called needing a refill on her Gabapentin  100 MG and is needing it sent to the [facility] in [address]. Pt has scheduled a f/u.

## 2024-06-29 MED ORDER — GABAPENTIN 100 MG PO CAPS: 300.0000 mg | ORAL_CAPSULE | Freq: Every day | ORAL | 1 refills | Status: DC | PRN

## 2024-06-29 NOTE — Telephone Encounter (Signed)
 I called pt.  She states that she has been taking gabapentin  and pramipexole  this sporadically for her nerve pain which works.  She has appt 08-2024 (on waitlist).  She did not know she needed appt for refills.  (I relayed yes yearly or is pcp wants to take on refills she may ask them when she sees them).  For now ok to renew?

## 2024-07-19 ENCOUNTER — Telehealth: Admitting: Neurology

## 2024-07-19 ENCOUNTER — Ambulatory Visit: Admitting: Neurology

## 2024-07-19 ENCOUNTER — Encounter (INDEPENDENT_AMBULATORY_CARE_PROVIDER_SITE_OTHER): Payer: Self-pay | Admitting: *Deleted

## 2024-07-19 DIAGNOSIS — E611 Iron deficiency: Secondary | ICD-10-CM

## 2024-07-19 DIAGNOSIS — G2581 Restless legs syndrome: Secondary | ICD-10-CM

## 2024-07-19 DIAGNOSIS — E559 Vitamin D deficiency, unspecified: Secondary | ICD-10-CM | POA: Diagnosis not present

## 2024-07-19 MED ORDER — GABAPENTIN 300 MG PO CAPS: 300.0000 mg | ORAL_CAPSULE | Freq: Every day | ORAL | 0 refills | Status: AC

## 2024-07-19 NOTE — Progress Notes (Signed)
 "  Interim history:   Alexis Craig is a 44 year old female with an underlying medical history of anxiety, reflux disease, with low vitamin D , migraine headaches, history of kidney stone, and overweight state, who presents for a MyChart VV for follow-up consultation of her restless leg syndrome.  The patient is unaccompanied today. She is located in her car in a parking lot. I am located in my office at [facility], utilizing my work health and safety inspector. I first met her at the request of her primary care provider on 01/08/2023, at which time she reported a longstanding history of RLS.  She was on pramipexole  at the time.  She had also tried Flexeril .  She was advised to start with gabapentin  at bedtime.  She was advised to proceed with a sleep study.  She had a home sleep test through our office on 02/11/2023 which did not show any significant obstructive sleep disordered breathing with an AHI borderline at 5.2/h, O2 nadir 91%.  We checked blood work including iron studies.  Her ferritin was below recommended at 27.  She was advised to start over-the-counter iron.  Today, 07/19/2024: She reports feeling about the same.  Symptoms wax and wane.  Some nights when she takes the pramipexole  on time, it helps, other times it does not help as well.  She tolerates the gabapentin , takes most nights 3 pills together, total of 300 mg, she tolerates it, no obvious side effects.  She had recent blood work through PCP on 06/02/2024 which showed a low normal vitamin B12 at 390, vitamin D  was below normal at 24, TSH normal at 1.68.  Iron studies were not done at the time. She will have a recheck on her vitamin D  level as she is on a prescription for 12 weeks.  She can have her iron studies done then she states.  Previously:  01/08/2023: (She) has a longstanding history of restless leg symptoms of over 15 years, she has a longer standing history dating back to her childhood of difficulty initiating and maintaining sleep, nonrestorative  sleep.  She has been on pramipexole  off and on for years.  She is currently on 0.5 mg strength, 1 pill in the afternoon but has taken 1 mg at a time without much in the way of success, she does not feel that the pramipexole  is working any longer.  When she was pregnant she was off the pramipexole  and was on Flexeril  which helped a little bit, it was mostly sedating and helped her sleep a little better.  She is not aware of any family history of restless leg syndrome but mom does not sleep well.  She lives with her family including husband and 100-year-old daughter.  She works for [employer] as [employer].  She quit smoking many years ago, she was not a pack per day smoker.  She does not drink any alcohol, she limits her caffeine to less than 1 cup/day on average.  She tries to be in bed between 9 and 9:30 PM and likes to read in bed, she is probably asleep between 10 and 11 most nights, her rise time is 6:30 AM.  She has no nightly nocturia but has woken up with a headache, does not typically take any medication for headaches.  Of note, she has been on sertraline , she had to restart it after having a baby due to significant postpartum anxiety.  She does believe that the sertraline  may be contributing to her restless leg symptoms.  She also had  low ferritin levels in August 2023 at 14.  She is currently not on iron supplementation, she started prescription vitamin D  in June after her vitamin D  was below 30 at her last blood work, I reviewed blood test results from 12/01/2022.  She has not been on gabapentin .  She has tried over-the-counter melatonin which did not help, over-the-counter p.m. medications exacerbated her leg movements and restless leg symptoms.  She has a lower EXTR move her legs, leg cramping and even painful sensation in her legs now which is quite bothersome.  She occasionally snores, she is not opposed to pursuing a sleep study at this time.  Her Past Medical History Is Significant For: Past  Medical History:  Diagnosis Date   Anal fissure 04/09/2009   Anemia    ANXIETY 03/09/2008   Depression    GERD (gastroesophageal reflux disease)    Gestational diabetes    metformin   High cholesterol    History of alcohol abuse    7 years ago   History of low potassium    Hx of varicella    Hypertension    INSOMNIA 10/05/2007   INTENTION TREMOR 06/13/2008   Low vitamin D  level    Migraines    NEPHROLITHIASIS, HX OF 08/23/2007   passed stone, no surgery   RECTAL BLEEDING 04/09/2009   RESTLESS LEG SYNDROME 01/22/2009    Her Past Surgical History Is Significant For: Past Surgical History:  Procedure Laterality Date   DILATION AND EVACUATION N/A 03/31/2019   Procedure: DILATATION AND EVACUATION with genetic studies;  Surgeon: Marget Lenis, MD;  Location: [facility] OR;  Service: Gynecology;  Laterality: N/A;  with genetic studies   EXCISIONAL HEMORRHOIDECTOMY     KIDNEY STONE SURGERY Right 07/2020   KIDNEY STONE SURGERY Left 08/2020   WISDOM TOOTH EXTRACTION      Her Family History Is Significant For: Family History  Problem Relation Age of Onset   Diabetes Mother    Thyroid  disease Mother    Cancer Mother    Heart disease Mother    Hypertension Mother    Hyperlipidemia Father    Diabetes Father    Heart disease Father    Hypertension Father    Cancer Maternal Grandmother        colon   Heart disease Paternal Grandfather    Heart disease Maternal Uncle    Restless legs syndrome Neg Hx     Her Social History Is Significant For: Social History   Socioeconomic History   Marital status: Married    Spouse name: Not on file   Number of children: 1   Years of education: Not on file   Highest education level: Bachelor's degree (e.g., BA, AB, BS)  Occupational History   Not on file  Tobacco Use   Smoking status: Former    Current packs/day: 0.00    Average packs/day: 0.5 packs/day for 12.0 years (6.0 ttl pk-yrs)    Types: Cigarettes    Start date: 06/24/1999    Quit  date: 06/24/2011    Years since quitting: 13.0   Smokeless tobacco: Never  Vaping Use   Vaping status: Never Used  Substance and Sexual Activity   Alcohol use: No    Comment: hx etoh abuse 7 years ago   Drug use: No   Sexual activity: Yes    Birth control/protection: None    Comment: approx [redacted] wks gestation  Other Topics Concern   Not on file  Social History Narrative   Lives at  home with husband and daughter   Right handed   Caffeine: 200 mg or less per day   Social Drivers of Health   Tobacco Use: Medium Risk (06/02/2024)   Patient History    Smoking Tobacco Use: Former    Smokeless Tobacco Use: Never    Passive Exposure: Not on file  Financial Resource Strain: Low Risk (05/30/2024)   Overall Financial Resource Strain (CARDIA)    Difficulty of Paying Living Expenses: Not hard at all  Food Insecurity: No Food Insecurity (05/30/2024)   Epic    Worried About Programme Researcher, Broadcasting/film/video in the Last Year: Never true    Ran Out of Food in the Last Year: Never true  Transportation Needs: No Transportation Needs (05/30/2024)   Epic    Lack of Transportation (Medical): No    Lack of Transportation (Non-Medical): No  Physical Activity: Insufficiently Active (05/30/2024)   Exercise Vital Sign    Days of Exercise per Week: 2 days    Minutes of Exercise per Session: 30 min  Stress: Stress Concern Present (05/30/2024)   [employer] of Occupational Health - Occupational Stress Questionnaire    Feeling of Stress: Rather much  Social Connections: Socially Integrated (05/30/2024)   Social Connection and Isolation Panel    Frequency of Communication with Friends and Family: More than three times a week    Frequency of Social Gatherings with Friends and Family: Three times a week    Attends Religious Services: 1 to 4 times per year    Active Member of Clubs or Organizations: Yes    Attends Banker Meetings: More than 4 times per year    Marital Status: Married  Depression  (PHQ2-9): Low Risk (06/02/2024)   Depression (PHQ2-9)    PHQ-2 Score: 4  Alcohol Screen: Not on file  Housing: Low Risk (05/30/2024)   Epic    Unable to Pay for Housing in the Last Year: No    Number of Times Moved in the Last Year: 0    Homeless in the Last Year: No  Utilities: Not on file  Health Literacy: Not on file    Her Allergies Are:  Allergies[1]:   Her Current Medications Are:  Outpatient Encounter Medications as of 07/19/2024  Medication Sig   ALPRAZolam  (XANAX ) 0.5 MG tablet Take 1 tablet (0.5 mg total) by mouth daily as needed for anxiety (for airplane travel).   amLODipine  (NORVASC ) 5 MG tablet TAKE 1 TABLET(5 MG) BY MOUTH DAILY   atorvastatin  (LIPITOR) 40 MG tablet TAKE 1 TABLET(40 MG) BY MOUTH DAILY   buPROPion  (WELLBUTRIN  XL) 150 MG 24 hr tablet Take 1 tablet (150 mg total) by mouth daily.   gabapentin  (NEURONTIN ) 100 MG capsule Take 3 capsules (300 mg total) by mouth daily as needed. TAKE 3 CAPSULES(300 MG) BY MOUTH AT BEDTIME   omeprazole (PRILOSEC OTC) 20 MG tablet Take 20 mg by mouth daily.   pramipexole  (MIRAPEX ) 0.5 MG tablet TAKE 1 TABLET(0.5 MG) BY MOUTH AT BEDTIME AS NEEDED   valsartan  (DIOVAN ) 160 MG tablet TAKE 1 TABLET(160 MG) BY MOUTH DAILY   Vitamin D , Ergocalciferol , (DRISDOL ) 1.25 MG (50000 UNIT) CAPS capsule Take 1 capsule (50,000 Units total) by mouth every 7 (seven) days for 12 doses.   No facility-administered encounter medications on file as of 07/19/2024.  :  Review of Systems:  Out of a complete 14 point review of systems, all are reviewed and negative with the exception of these symptoms as listed below:  Virtual  Visit via Video Note on 07/19/2024:   I connected with Alexis Craig on 07/19/24 at  2:15 PM EST by a video enabled telemedicine application and verified that I am speaking with the correct person using two identifiers.   I discussed the limitations of evaluation and management by telemedicine and the availability of in person  appointments. The patient expressed understanding and agreed to proceed.  History of Present Illness:  See above.  Observations/Objective:  Pleasant, conversant, no acute distress, no tachypnea.  Able to speak in full sentences, face is symmetric, speech without dysarthria, hypophonia or voice tremor.  Upper body movements with any obvious limitation.  No lip, neck or jaw tremor.  Assessment and Plan:  In summary, Alexis Craig is a 44 year old female with an underlying medical history of anxiety, reflux disease, low vitamin D , migraine headaches, history of kidney stone, and overweight state, who presents for a MyChart video visit for follow-up consultation of her restless leg syndrome.  She has a longstanding history.  Her ferritin level was low back in July 2024.  She has been taking over-the-counter iron supplementation.  I recommended a recheck of her iron studies with her next scheduled blood work through PCP.  She is currently on a prescription vitamin D  for the next few more weeks.  She has been on pramipexole  0.5 mg nightly and we started gabapentin  as an adjunct in July 2024.  She is okay continuing with gabapentin  300 mg.  I have suggested a change in her prescription to the 300 mg strength so she only has to take 1 pill at bedtime.  We talked about alternative treatment options.  She is also advised that dopamine agonist are generally not recommended as first-line treatment for restless leg syndrome but since she is on a lower dose and stable, she can maintain it but I would recommend no increase in her dose.  The prescription has been through her PCP.  She is advised to follow-up with her primary care provider and other specialists as scheduled/planned.  If her PCP is comfortable, I recommend further refills of her gabapentin  through them.  I have placed a prescription for gabapentin  300 mg strength 1 pill at bedtime with a 90-day prescription for now.  She is agreeable to asking her  PCP for further refills from now on.  We talked about RLS triggers and alleviating factors today.  She is advised to follow-up in this clinic on an as needed basis at this point.  I answered all her questions today and she was in agreement.   Follow Up Instructions:    I discussed the assessment and treatment plan with the patient. The patient was provided an opportunity to ask questions and all were answered. The patient agreed with the plan and demonstrated an understanding of the instructions.   The patient was advised to call back or seek an in-person evaluation if the symptoms worsen or if the condition fails to improve as anticipated.  I provided 20 minutes of non-face-to-face time during this encounter.   True Mar, MD          [1]  Allergies Allergen Reactions   Nsaids Rash    Blisters also   Ibuprofen     Fixed drug reaction with bullous dermatitis   "

## 2024-07-19 NOTE — Patient Instructions (Addendum)
 We can continue with gabapentin  300 mg at bedtime, I have changed your prescription to the 300 mg strength.  If you are stable on this dose, and if your primary care is comfortable maintaining you on it, please request refills from Dr. Theophilus Andrews after the next prescription. I would be happy to see you back in this clinic as needed.   Please try to stay well-hydrated and well rested.  Monitor your iron studies with the next blood work, I would recommend your ferritin level ideally above 75, but above 50 is typically recommended.  Your level was 27 back in July 2024. I think improving your vitamin D  level will also help your restless leg symptoms.

## 2024-08-05 ENCOUNTER — Other Ambulatory Visit: Payer: Self-pay | Admitting: Internal Medicine

## 2024-09-08 ENCOUNTER — Ambulatory Visit: Admitting: Neurology
